# Patient Record
Sex: Male | Born: 1965 | Hispanic: Yes | Marital: Married | Smoking: Never smoker
Health system: Southern US, Community
[De-identification: ages and names within clinical notes are randomized; demographics above are authoritative.]

## PROBLEM LIST (undated history)

## (undated) DIAGNOSIS — Z992 Dependence on renal dialysis: Secondary | ICD-10-CM

## (undated) DIAGNOSIS — I10 Essential (primary) hypertension: Secondary | ICD-10-CM

## (undated) DIAGNOSIS — E119 Type 2 diabetes mellitus without complications: Secondary | ICD-10-CM

## (undated) DIAGNOSIS — N186 End stage renal disease: Secondary | ICD-10-CM

## (undated) DIAGNOSIS — N289 Disorder of kidney and ureter, unspecified: Secondary | ICD-10-CM

## (undated) HISTORY — PX: FOOT SURGERY: SHX648

## (undated) HISTORY — PX: AV FISTULA PLACEMENT: SHX1204

---

## 2007-04-02 ENCOUNTER — Encounter: Payer: Self-pay | Admitting: Family Medicine

## 2010-07-28 ENCOUNTER — Emergency Department: Payer: Self-pay | Admitting: Emergency Medicine

## 2010-08-17 ENCOUNTER — Emergency Department: Payer: Self-pay | Admitting: Emergency Medicine

## 2011-07-14 ENCOUNTER — Observation Stay: Payer: Self-pay | Admitting: Internal Medicine

## 2013-02-18 DIAGNOSIS — N179 Acute kidney failure, unspecified: Secondary | ICD-10-CM | POA: Insufficient documentation

## 2013-03-11 DIAGNOSIS — N19 Unspecified kidney failure: Secondary | ICD-10-CM | POA: Insufficient documentation

## 2013-03-16 ENCOUNTER — Inpatient Hospital Stay: Payer: Self-pay | Admitting: Internal Medicine

## 2013-03-16 LAB — HEMOGLOBIN A1C: Hemoglobin A1C: <3.5 % — ABNORMAL LOW (ref 4.2–6.3)

## 2013-03-16 LAB — COMPREHENSIVE METABOLIC PANEL
Albumin: 1.6 g/dL — ABNORMAL LOW (ref 3.4–5.0)
Alkaline Phosphatase: 151 U/L — ABNORMAL HIGH (ref 50–136)
Anion Gap: 4 — ABNORMAL LOW (ref 7–16)
Calcium, Total: 7.9 mg/dL — ABNORMAL LOW (ref 8.5–10.1)
Chloride: 105 mmol/L (ref 98–107)
Co2: 26 mmol/L (ref 21–32)
Creatinine: 6.54 mg/dL — ABNORMAL HIGH (ref 0.60–1.30)
EGFR (African American): 11 — ABNORMAL LOW
Glucose: 94 mg/dL (ref 65–99)
Osmolality: 276 (ref 275–301)
Potassium: 4.8 mmol/L (ref 3.5–5.1)
SGOT(AST): 24 U/L (ref 15–37)
Sodium: 135 mmol/L — ABNORMAL LOW (ref 136–145)
Total Protein: 7.1 g/dL (ref 6.4–8.2)

## 2013-03-16 LAB — CBC
MCH: 29.4 pg (ref 26.0–34.0)
MCHC: 33.8 g/dL (ref 32.0–36.0)
Platelet: 367 10*3/uL (ref 150–440)
RBC: 2.79 10*6/uL — ABNORMAL LOW (ref 4.40–5.90)
RDW: 14.7 % — ABNORMAL HIGH (ref 11.5–14.5)

## 2013-03-16 LAB — PHOSPHORUS: Phosphorus: 3.3 mg/dL (ref 2.5–4.9)

## 2013-03-17 LAB — MAGNESIUM: Magnesium: 1.4 mg/dL — ABNORMAL LOW

## 2013-03-17 LAB — HEMOGLOBIN A1C: Hemoglobin A1C: 4.9 % (ref 4.2–6.3)

## 2013-03-18 LAB — PHOSPHORUS: Phosphorus: 2.2 mg/dL — ABNORMAL LOW (ref 2.5–4.9)

## 2013-03-20 LAB — BASIC METABOLIC PANEL
Anion Gap: 3 — ABNORMAL LOW (ref 7–16)
BUN: 19 mg/dL — ABNORMAL HIGH (ref 7–18)
Calcium, Total: 7.4 mg/dL — ABNORMAL LOW (ref 8.5–10.1)
Co2: 31 mmol/L (ref 21–32)
Creatinine: 5.02 mg/dL — ABNORMAL HIGH (ref 0.60–1.30)
Glucose: 110 mg/dL — ABNORMAL HIGH (ref 65–99)
Osmolality: 271 (ref 275–301)
Potassium: 5 mmol/L (ref 3.5–5.1)
Sodium: 134 mmol/L — ABNORMAL LOW (ref 136–145)

## 2013-03-20 LAB — PLATELET COUNT: Platelet: 322 10*3/uL (ref 150–440)

## 2013-03-23 ENCOUNTER — Ambulatory Visit: Payer: Self-pay | Admitting: Nephrology

## 2013-04-07 ENCOUNTER — Ambulatory Visit: Payer: Self-pay | Admitting: Vascular Surgery

## 2013-04-07 LAB — BASIC METABOLIC PANEL
Anion Gap: 4 — ABNORMAL LOW (ref 7–16)
BUN: 23 mg/dL — ABNORMAL HIGH (ref 7–18)
Calcium, Total: 8.4 mg/dL — ABNORMAL LOW (ref 8.5–10.1)
Chloride: 101 mmol/L (ref 98–107)
EGFR (Non-African Amer.): 11 — ABNORMAL LOW
Osmolality: 274 (ref 275–301)
Sodium: 136 mmol/L (ref 136–145)

## 2013-04-07 LAB — CBC
Platelet: 335 10*3/uL (ref 150–440)
RBC: 3.17 10*6/uL — ABNORMAL LOW (ref 4.40–5.90)
WBC: 5.3 10*3/uL (ref 3.8–10.6)

## 2013-04-10 ENCOUNTER — Ambulatory Visit: Payer: Self-pay | Admitting: Vascular Surgery

## 2013-04-14 ENCOUNTER — Ambulatory Visit: Payer: Self-pay | Admitting: Vascular Surgery

## 2013-04-15 ENCOUNTER — Ambulatory Visit: Payer: Self-pay | Admitting: Vascular Surgery

## 2013-06-09 ENCOUNTER — Ambulatory Visit: Payer: Self-pay | Admitting: Vascular Surgery

## 2013-06-30 ENCOUNTER — Ambulatory Visit: Payer: Self-pay | Admitting: Vascular Surgery

## 2013-07-14 ENCOUNTER — Ambulatory Visit: Payer: Self-pay | Admitting: Physician Assistant

## 2013-07-21 DIAGNOSIS — L97409 Non-pressure chronic ulcer of unspecified heel and midfoot with unspecified severity: Secondary | ICD-10-CM | POA: Insufficient documentation

## 2013-12-30 ENCOUNTER — Inpatient Hospital Stay: Payer: Self-pay | Admitting: Family Medicine

## 2013-12-30 LAB — CBC
HCT: 34.1 % — AB (ref 40.0–52.0)
HGB: 11.4 g/dL — ABNORMAL LOW (ref 13.0–18.0)
MCH: 32.9 pg (ref 26.0–34.0)
MCHC: 33.4 g/dL (ref 32.0–36.0)
MCV: 99 fL (ref 80–100)
PLATELETS: 216 10*3/uL (ref 150–440)
RBC: 3.46 10*6/uL — AB (ref 4.40–5.90)
RDW: 14.2 % (ref 11.5–14.5)
WBC: 10.7 10*3/uL — ABNORMAL HIGH (ref 3.8–10.6)

## 2013-12-30 LAB — URINALYSIS, COMPLETE
Bilirubin,UR: NEGATIVE
Blood: NEGATIVE
Glucose,UR: >=500 mg/dL (ref 0–75)
Ketone: NEGATIVE
Leukocyte Esterase: NEGATIVE
NITRITE: NEGATIVE
Ph: 6 (ref 4.5–8.0)
RBC,UR: 11 /HPF (ref 0–5)
Specific Gravity: 1.018 (ref 1.003–1.030)
Squamous Epithelial: NONE SEEN
WBC UR: 2 /HPF (ref 0–5)

## 2013-12-30 LAB — COMPREHENSIVE METABOLIC PANEL
ALK PHOS: 152 U/L — AB
AST: 37 U/L (ref 15–37)
Albumin: 3.4 g/dL (ref 3.4–5.0)
Anion Gap: 11 (ref 7–16)
BUN: 59 mg/dL — ABNORMAL HIGH (ref 7–18)
Bilirubin,Total: 0.7 mg/dL (ref 0.2–1.0)
CALCIUM: 7.7 mg/dL — AB (ref 8.5–10.1)
CHLORIDE: 97 mmol/L — AB (ref 98–107)
CO2: 24 mmol/L (ref 21–32)
Creatinine: 9.41 mg/dL — ABNORMAL HIGH (ref 0.60–1.30)
GFR CALC AF AMER: 7 — AB
GFR CALC NON AF AMER: 6 — AB
Glucose: 117 mg/dL — ABNORMAL HIGH (ref 65–99)
OSMOLALITY: 282 (ref 275–301)
Potassium: 5.1 mmol/L (ref 3.5–5.1)
SGPT (ALT): 49 U/L (ref 12–78)
Sodium: 132 mmol/L — ABNORMAL LOW (ref 136–145)
Total Protein: 9.1 g/dL — ABNORMAL HIGH (ref 6.4–8.2)

## 2013-12-30 LAB — PHOSPHORUS: Phosphorus: 3.8 mg/dL (ref 2.5–4.9)

## 2013-12-30 LAB — RAPID INFLUENZA A&B ANTIGENS (ARMC ONLY)

## 2013-12-30 LAB — LIPASE, BLOOD: LIPASE: 134 U/L (ref 73–393)

## 2013-12-30 LAB — TROPONIN I: Troponin-I: 0.03 ng/mL

## 2013-12-31 LAB — COMPREHENSIVE METABOLIC PANEL
ALK PHOS: 120 U/L — AB
AST: 26 U/L (ref 15–37)
Albumin: 2.7 g/dL — ABNORMAL LOW (ref 3.4–5.0)
Anion Gap: 3 — ABNORMAL LOW (ref 7–16)
BUN: 33 mg/dL — AB (ref 7–18)
Bilirubin,Total: 0.8 mg/dL (ref 0.2–1.0)
Calcium, Total: 7.6 mg/dL — ABNORMAL LOW (ref 8.5–10.1)
Chloride: 99 mmol/L (ref 98–107)
Co2: 34 mmol/L — ABNORMAL HIGH (ref 21–32)
Creatinine: 6.57 mg/dL — ABNORMAL HIGH (ref 0.60–1.30)
EGFR (African American): 11 — ABNORMAL LOW
GFR CALC NON AF AMER: 9 — AB
Glucose: 106 mg/dL — ABNORMAL HIGH (ref 65–99)
Osmolality: 280 (ref 275–301)
Potassium: 4.2 mmol/L (ref 3.5–5.1)
SGPT (ALT): 36 U/L (ref 12–78)
Sodium: 136 mmol/L (ref 136–145)
Total Protein: 7.6 g/dL (ref 6.4–8.2)

## 2013-12-31 LAB — CBC WITH DIFFERENTIAL/PLATELET
BASOS PCT: 0.2 %
Basophil #: 0.1 10*3/uL (ref 0.0–0.1)
EOS PCT: 0.2 %
Eosinophil #: 0 10*3/uL (ref 0.0–0.7)
HCT: 30.4 % — ABNORMAL LOW (ref 40.0–52.0)
HGB: 10.2 g/dL — ABNORMAL LOW (ref 13.0–18.0)
LYMPHS ABS: 0.8 10*3/uL — AB (ref 1.0–3.6)
Lymphocyte %: 3.7 %
MCH: 33 pg (ref 26.0–34.0)
MCHC: 33.7 g/dL (ref 32.0–36.0)
MCV: 98 fL (ref 80–100)
Monocyte #: 1.2 x10 3/mm — ABNORMAL HIGH (ref 0.2–1.0)
Monocyte %: 5.4 %
NEUTROS PCT: 90.5 %
Neutrophil #: 20.5 10*3/uL — ABNORMAL HIGH (ref 1.4–6.5)
Platelet: 163 10*3/uL (ref 150–440)
RBC: 3.11 10*6/uL — AB (ref 4.40–5.90)
RDW: 14.2 % (ref 11.5–14.5)
WBC: 22.6 10*3/uL — ABNORMAL HIGH (ref 3.8–10.6)

## 2013-12-31 LAB — CLOSTRIDIUM DIFFICILE(ARMC)

## 2013-12-31 LAB — MAGNESIUM: MAGNESIUM: 1.9 mg/dL

## 2014-01-01 LAB — CBC WITH DIFFERENTIAL/PLATELET
BASOS ABS: 0.1 10*3/uL (ref 0.0–0.1)
BASOS PCT: 0.4 %
EOS PCT: 1.1 %
Eosinophil #: 0.1 10*3/uL (ref 0.0–0.7)
HCT: 28.4 % — AB (ref 40.0–52.0)
HGB: 9.5 g/dL — ABNORMAL LOW (ref 13.0–18.0)
LYMPHS ABS: 1.3 10*3/uL (ref 1.0–3.6)
LYMPHS PCT: 9.1 %
MCH: 32.8 pg (ref 26.0–34.0)
MCHC: 33.3 g/dL (ref 32.0–36.0)
MCV: 99 fL (ref 80–100)
MONOS PCT: 7.4 %
Monocyte #: 1 x10 3/mm (ref 0.2–1.0)
NEUTROS ABS: 11.6 10*3/uL — AB (ref 1.4–6.5)
Neutrophil %: 82 %
Platelet: 140 10*3/uL — ABNORMAL LOW (ref 150–440)
RBC: 2.89 10*6/uL — ABNORMAL LOW (ref 4.40–5.90)
RDW: 14.2 % (ref 11.5–14.5)
WBC: 14.1 10*3/uL — ABNORMAL HIGH (ref 3.8–10.6)

## 2014-01-01 LAB — PHOSPHORUS: Phosphorus: 5.5 mg/dL — ABNORMAL HIGH (ref 2.5–4.9)

## 2014-01-01 LAB — URINE CULTURE

## 2014-01-01 LAB — WBCS, STOOL

## 2014-01-04 LAB — VANCOMYCIN, TROUGH: Vancomycin, Trough: 22 ug/mL (ref 10–20)

## 2014-01-04 LAB — PHOSPHORUS: PHOSPHORUS: 4.4 mg/dL (ref 2.5–4.9)

## 2014-01-04 LAB — CULTURE, BLOOD (SINGLE)

## 2014-01-05 LAB — CULTURE, BLOOD (SINGLE)

## 2014-01-06 LAB — CULTURE, BLOOD (SINGLE)

## 2014-01-07 LAB — STOOL CULTURE

## 2014-04-14 ENCOUNTER — Emergency Department: Payer: Self-pay | Admitting: Emergency Medicine

## 2014-04-14 LAB — COMPREHENSIVE METABOLIC PANEL
ALT: 39 U/L (ref 12–78)
Albumin: 3.4 g/dL (ref 3.4–5.0)
Alkaline Phosphatase: 156 U/L — ABNORMAL HIGH
Anion Gap: 6 — ABNORMAL LOW (ref 7–16)
BUN: 16 mg/dL (ref 7–18)
Bilirubin,Total: 0.3 mg/dL (ref 0.2–1.0)
CHLORIDE: 92 mmol/L — AB (ref 98–107)
CO2: 33 mmol/L — AB (ref 21–32)
CREATININE: 4.85 mg/dL — AB (ref 0.60–1.30)
Calcium, Total: 8.4 mg/dL — ABNORMAL LOW (ref 8.5–10.1)
EGFR (African American): 15 — ABNORMAL LOW
EGFR (Non-African Amer.): 13 — ABNORMAL LOW
GLUCOSE: 171 mg/dL — AB (ref 65–99)
OSMOLALITY: 268 (ref 275–301)
POTASSIUM: 4.3 mmol/L (ref 3.5–5.1)
SGOT(AST): 34 U/L (ref 15–37)
SODIUM: 131 mmol/L — AB (ref 136–145)
Total Protein: 9.6 g/dL — ABNORMAL HIGH (ref 6.4–8.2)

## 2014-04-14 LAB — CBC WITH DIFFERENTIAL/PLATELET
BASOS ABS: 0.1 10*3/uL (ref 0.0–0.1)
Basophil %: 1.3 %
Eosinophil #: 0.3 10*3/uL (ref 0.0–0.7)
Eosinophil %: 7.5 %
HCT: 35 % — ABNORMAL LOW (ref 40.0–52.0)
HGB: 12 g/dL — ABNORMAL LOW (ref 13.0–18.0)
LYMPHS ABS: 1.1 10*3/uL (ref 1.0–3.6)
LYMPHS PCT: 25.3 %
MCH: 34.5 pg — AB (ref 26.0–34.0)
MCHC: 34.4 g/dL (ref 32.0–36.0)
MCV: 100 fL (ref 80–100)
Monocyte #: 0.4 x10 3/mm (ref 0.2–1.0)
Monocyte %: 9.6 %
NEUTROS ABS: 2.4 10*3/uL (ref 1.4–6.5)
Neutrophil %: 56.3 %
PLATELETS: 280 10*3/uL (ref 150–440)
RBC: 3.49 10*6/uL — AB (ref 4.40–5.90)
RDW: 13.9 % (ref 11.5–14.5)
WBC: 4.2 10*3/uL (ref 3.8–10.6)

## 2014-04-22 DIAGNOSIS — E113599 Type 2 diabetes mellitus with proliferative diabetic retinopathy without macular edema, unspecified eye: Secondary | ICD-10-CM | POA: Insufficient documentation

## 2014-04-22 DIAGNOSIS — H538 Other visual disturbances: Secondary | ICD-10-CM | POA: Insufficient documentation

## 2014-04-22 DIAGNOSIS — H04129 Dry eye syndrome of unspecified lacrimal gland: Secondary | ICD-10-CM | POA: Insufficient documentation

## 2014-07-22 ENCOUNTER — Ambulatory Visit: Payer: Self-pay | Admitting: Nephrology

## 2014-12-01 ENCOUNTER — Inpatient Hospital Stay: Payer: Self-pay | Admitting: Internal Medicine

## 2015-01-19 ENCOUNTER — Emergency Department: Admit: 2015-01-19 | Disposition: A | Discharge status: Home / Self Care | Payer: Self-pay | Admitting: Internal Medicine

## 2015-02-04 NOTE — Op Note (Signed)
PATIENT NAME:  Jerry [name], SISNEROS MR#:  [id number] DATE OF BIRTH:  01/01/1966  DATE OF PROCEDURE:  07/14/2013  PREOPERATIVE DIAGNOSIS: End-stage renal disease with functional permanent dialysis access and no longer needing PermCath.   POSTOPERATIVE DIAGNOSIS: End-stage renal disease with functional permanent dialysis access and no longer needing PermCath.   PROCEDURE PERFORMED: Removal of right jugular PermCath.   SURGEON: Personnel officer.   ANESTHESIA: Local.   ESTIMATED BLOOD LOSS: Minimal.   INDICATION FOR THE PROCEDURE: The patient is a 49 year old male with end-stage renal disease. His access is functional and he no longer needs his PermCath, therefore this will be removed.   DESCRIPTION OF THE PROCEDURE: The patient is brought to the vascular interventional radiology area and positioned supine. The right neck and chest and existing catheter were sterilely prepped and draped and a sterile surgical field was created. The area was locally anesthetized copiously with 1% lidocaine. Hemostats were used to help dissect out the cuff. An 11 blade was used to transect the fibrous sheath connected to the cuff. The catheter was then removed in its entirety without difficulty with gentle traction. Pressure was held at the base of the neck. Sterile dressing was placed. The patient tolerated the procedure well    ____________________________ Marin Shutter. Andreu Drudge, PA-C cnh:dp D: 07/14/2013 09:18:20 ET T: 07/14/2013 09:27:00 ET JOB#: [id number]  cc: Marin Shutter. Nabil Bubolz, PA-C, <Dictator> Gratz PA ELECTRONICALLY SIGNED 07/27/2013 15:35

## 2015-02-04 NOTE — Op Note (Signed)
PATIENT NAME:  Jerry [name], Jerry [name] MR#:  [id number] DATE OF BIRTH:  12-11-1965  DATE OF PROCEDURE:  06/30/2013  PREOPERATIVE DIAGNOSES:  1.  Complication arteriovenous dialysis fistula, left arm.  2.  Stricture of left radial artery.  POSTOPERATIVE DIAGNOSES:  1.  Complication arteriovenous dialysis fistula, left arm.  2.  Stricture of left radial artery.  PROCEDURES PERFORMED: 1.  Arch aortogram.  2.  Left upper extremity angiography, third order catheter placement.  3.  Percutaneous transluminal angioplasty to 5 mm, left radial artery, at the anastomosis of the AV fistula.   SURGEON: Hortencia Pilar, M.D.   SEDATION: Versed 5 mg plus fentanyl 150 mcg administered IV. Continuous ECG, pulse oximetry and cardiopulmonary monitoring is performed throughout the entire procedure by the interventional radiology nurse. Total sedation time was 1 hour 15 minutes.   ACCESS: 6-French sheath, right common femoral artery.   FLUOROSCOPY TIME: 5.2 minutes.   CONTRAST USED: Isovue 50 mL.   INDICATIONS: Jerry [name] is a 49 year old gentleman maintained on hemodialysis who has been having problems with his fistula. Work-up has demonstrated a stricture in the radial artery just above the anastomosis. He is therefore undergoing angiography with the intention for treating the stricture. The risks and benefits were reviewed. All questions answered. The patient has agreed to proceed.   DESCRIPTION OF PROCEDURE: The patient is taken to special procedures and placed in the supine position. After adequate sedation is achieved, both groins are prepped and draped in sterile fashion. Ultrasound is placed in a sterile sleeve. Femoral artery is identified. It is echolucent and pulsatile indicating patency. Image is recorded for the permanent record. Under direct visualization, a micropuncture needle is used to access the anterior wall of the common femoral artery, microwire followed micro sheath, J-wire  followed by 5-French sheath and 5-French pigtail catheter. Pigtail catheter is advanced over the J-wire into the ascending aorta and LAO projection of the arch is obtained. A 260 Advantage wire is then exchanged through the pigtail and H1 catheter is used to select the left subclavian. The wire-catheter combination is then serially advanced out doing hand injection of contrast to show the arterial anatomy of the left upper extremity. High bifurcation between the radial and ulnar branches is noted at the mid biceps level and the radial branch is selected as this is the branch that is associated with the fistula.   Selective imaging of the radial artery is then obtained which demonstrates the greater than 80% stricture at the level of the anastomosis. This appears to be a clamp injury and limiting flow to the fistula. Wire is then negotiated distal and distal radial artery angiography is performed via 150 straight glide catheter. Wire is then reintroduced and first a 4 x 4 balloon and then a 5 x 4 balloon is used to angioplasty the stenosis. Inflation is to 10 atmospheres for 1 minute and 3000 units of heparin had been given. Follow-up angiography demonstrates that there is now resolution of the stricture proximally. Distally there appears to be some spasm. However, given the flow in the radial artery is retrograde and that the hand is actually filled by the ulnar artery, this does not represent a problem and no further treatments are warranted as we have now established normal in-flow to the fistula.   The sheath is then pulled into the external iliac on the right, oblique view is obtained, and a StarClose device deployed without difficulty. There are no immediate complications.   INTERPRETATION: The aortic arch is  opacified with a bolus injection of contrast. There is a bovine anatomy noted. The arch is otherwise free of strictures or stenoses. The left subclavian is widely patent as is the axillary and the  proximal brachial. There is a high bifurcation of the radial and ulnar artery is noted at the mid biceps level. The fistula is filled by the radial branch and there is a greater than 80% stenosis just proximal to the fistula. The fistula is otherwise widely patent. Ulnar artery is widely patent as is the interosseous, which is based off the ulnar branch.   Following angioplasty, there is resolution of the stricture in the radial artery proximally.   SUMMARY: Successful treatment of radial artery stricture establishing normal flow to the fistula. ____________________________ Katha Cabal, MD ggs:sb D: 06/30/2013 11:06:54 ET T: 06/30/2013 11:27:26 ET JOB#: [id number]  cc: Katha Cabal, MD, <Dictator> Carla Debard Nephrologist Katha Cabal MD ELECTRONICALLY SIGNED 07/16/2013 8:39

## 2015-02-04 NOTE — Op Note (Signed)
PATIENT NAME:  [name], Jerry MR#:  [id number] DATE OF BIRTH:  Jul 01, 1966  DATE OF PROCEDURE:  06/09/2013  PREOPERATIVE DIAGNOSES: 1.  Complication of arteriovenous fistula.  2.  End-stage renal disease requiring hemodialysis.   POSTOPERATIVE DIAGNOSIS:  1.  Complication of arteriovenous fistula.  2.  End-stage renal disease requiring hemodialysis.   PROCEDURES PERFORMED: 1.  Contrast injection, left arm brachiocephalic fistula.   SURGEON: Katha Cabal, M.D.   SEDATION:  Versed 2 mg plus fentanyl 50 mcg administered IV. Continuous ECG, pulse oximetry and cardiopulmonary monitoring is performed throughout the entire procedure by the interventional radiology nurse. Total sedation time was 25 minutes.   ACCESS: 6-French sheath, left arm brachiocephalic fistula, antegrade direction.   CONTRAST USED: Isovue 20 mL.   FLUOROSCOPY TIME: 0.1 minutes.   INDICATIONS: [name] is a 49 year old gentleman who has end-stage renal disease. He is on hemodialysis via a right IJ tunnel catheter. Fistula is being evaluated as they have been unable to cannulate it.  Risks and benefits were reviewed. All questions answered. The patient agrees to proceed.   DESCRIPTION OF PROCEDURE: The patient is taken to special procedures and placed in the supine position. After adequate sedation is achieved, he is positioned supine with his left arm extended palm upward. The left arm is prepped and draped in a sterile fashion.   1% lidocaine is infiltrated into the soft tissues and access to the fistula is obtained with a micropuncture needle, microwire followed by microsheath, J-wire followed by a 6-French sheath. Hand injection of contrast is then utilized to demonstrate the fistula. After review of the images, pursestring suture is placed and light pressure is held, and there are no immediate complications.   INTERPRETATION: Images of the fistula demonstrate the cephalic vein is widely patent.  There are no lesions noted throughout its course nor is there any the lesions noted at the cephalic confluence. The central veins are all widely patent.   Reflux images, however, demonstrate that at the level of the anastomosis specifically, there is narrowing within the brachial artery, both proximal and distal to the anastomosis. Because of its bidirectional nature, this is not amenable to intervention from the direction of the fistula. This is a situation that would require intervention from a femoral approach. Therefore, the procedure was terminated at this time.   INTERPRETATION: As noted above, the fistula itself is widely patent with an excellent result. The central veins are widely patent. The anastomosis, however, demonstrates significant stenosis, both proximal and distal. Therefore, extensive discussion with the interpreter present was performed in the recovery area describing the need to approach this from a femoral approach where the brachial artery itself can be addressed treating the proximal and distal strictures simultaneously with a single balloon.     ____________________________ Katha Cabal, MD ggs:dmm D: 06/09/2013 18:25:25 ET T: 06/09/2013 19:36:23 ET JOB#: [id number]  cc: Katha Cabal, MD, <Dictator> Katha Cabal MD ELECTRONICALLY SIGNED 06/26/2013 9:11

## 2015-02-04 NOTE — H&P (Signed)
PATIENT NAME:  Jerry [name], WILKE MR#:  [id number] DATE OF BIRTH:  1966/07/11  DATE OF ADMISSION:  03/16/2013  REFERRING PHYSICIAN:  Dr. Donald Pore.   CHIEF COMPLAINT: Hemodialysis.   HISTORY OF PRESENT ILLNESS: A 49 year old male with a past history of diabetes, profound retinopathy and vision impairment, chronic kidney disease stage V, started on hemodialysis recently 2 weeks ago at [facility] with Permacath placement, hypertension, left foot ulcer status debridement. After starting him on dialysis and getting hemodialysis on last Friday he was discharged from [facility] as he is living near by [facility] and was told to go to [facility] Emergency Room at 7 in the morning on Monday and they will arrange for your hemodialysis.   The patient denies any complaints currently, and he is being admitted to get hemodialysis and arrange for outpatient hemodialysis.   SOCIAL HISTORY: He was a smoker in the past, quit 4 years ago. 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 Drug use: He reports that he does not have any illegal drug use. Lives at home with wife and children in [place].   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   FAMILY HISTORY: Mother has glaucoma, and no family history of chronic kidney disease.   REVIEW OF SYSTEMS: Negative for fever, fatigue, weakness, pain, or weight loss.  EYES: No blurring, double vision, pain or redness.  EARS, NOSE, THROAT: No tinnitus, ear pain or hearing loss.  RESPIRATORY: No cough, wheezing, hemoptysis or shortness of breath.  CARDIOVASCULAR: No chest pain, orthopnea, edema or  arrhythmia.  GASTROINTESTINAL: No nausea, vomiting, diarrhea or abdominal pain.   GENITOURINARY: No dysuria, hematuria or increased frequency.  ENDOCRINOLOGIC: No heat or cold intolerance.  SKIN: No acne or rashes.  MUSCULOSKELETAL: No swelling or pain in the joints.  NEUROLOGICAL: No numbness, weakness, tremors or headaches.  PSYCHIATRIC: No anxiety, insomnia.   HOME MEDICATIONS: Acetaminophen 325 mg, take 2 tablets every 4 hours as needed for pain, calcium carbonate 1000 mg oral tablet, 1 tablet 3 times a day, epoietin alpha 1 mL intravenous injection once a day with hemodialysis 3 times a week, gabapentin 600 mg oral tablet once a day as needed for pain, Kayexalate oral and rectal powder 60 mL once a day, currently not taking after discharge from [facility]; ondansetron 40 mg tablet every 8 hours as needed for nausea and vomiting, Renagel 800 mg oral tablet 3 times a day, senna 2 tablets once a day for constipation, trazodone 50 mg oral tablet once a day at bedtime as needed for sleep, vitamin D3 1000 international units oral tablet once a day.   VITALS: In the ER temperature 98.5, pulse rate 93, respirations 18, blood pressure 165/87, and pulse ox 100% on room air.  PHYSICAL EXAMINATION: He is fully alert, oriented to time, place and person.  HEENT: Head and neck atraumatic. Conjunctivae pink. Oral mucosa moist.  NECK: Supple. No JVD.  RESPIRATORY: Bilateral clear and equal air entry.  CARDIOVASCULAR: S1, S2 present, regular. No murmur. Right-sided Permacath present over the chest. Legally blind.  ABDOMEN: Soft, nontender. Bowel sounds present. No organomegaly.  SKIN: No rashes.  JOINTS: No swelling or tenderness.  LEGS: No edema.  NEUROLOGICAL: Power 5/5. Follows. No tremors.  PSYCHIATRIC: Does not appear in any acute psychiatric illness. Mood: Slightly upset as he feels bad that at [place] did not tell him proper information that he would not be able to get dialysis as an outpatient when  they inform him about all the details  LAB RESULTS:  BUN 31, creatinine is 6.54, sodium 135, potassium 4.8, chloride 105, CO2 26, calcium 7.9, total protein 7.1, bilirubin 0.2, alkaline phosphate 151, SGOT 24, SGPT 19.   WBC 6.9, hemoglobin 8.2, platelet count 367, MCV 87.   ASSESSMENT AND PLAN:  1. End-stage renal disease: Nephrology has evaluated the patient's Permacath. Will have to get inpatient dialysis. Case manager consult is called in to arrange for care of those to get him outpatient dialysis.  2.  Diabetes: Will do glucose checks, with coverage.  3.  Diabetic neuropathy: Will give him gabapentin as needed as he was taking at home.  4.  High blood pressure: We will give him amlodipine 10 mg oral daily  5.  Diabetic retinopathy: Legally blind. This is a chronic issue, and the patient is already followed by Ophthalmology at [facility]. Will leave it up to him.   Total time spent on this admission: Sixty minutes.   Translation was done by Ms Bonnita Nasuti with the patient in the Emergency Department, and condition and plan explained to the patient and his family members in the room.   CODE STATUS: FULL CODE.    ____________________________ Ceasar Lund Anselm Jungling, MD vgv:dm D: 03/16/2013 14:37:53 ET T: 03/16/2013 15:11:45 ET JOB#: [id number]  cc: Ceasar Lund. Anselm Jungling, MD, <Dictator> Vaughan Basta MD ELECTRONICALLY SIGNED 03/17/2013 20:10

## 2015-02-04 NOTE — Op Note (Signed)
PATIENT NAME:  Jerry [name], Jerry [name] MR#:  [id number] DATE OF BIRTH:  1966/07/15  DATE OF PROCEDURE:  04/15/2013  PREOPERATIVE DIAGNOSIS: End-stage renal disease requiring hemodialysis.   POSTOPERATIVE DIAGNOSIS: End-stage renal disease requiring hemodialysis.   PROCEDURE PERFORMED: Creation of a left arm brachiocephalic fistula.   SURGEON: Civil engineer, contracting.   ANESTHESIA: MAC.   ESTIMATED BLOOD LOSS: Minimal.   SPECIMEN: None.   INDICATIONS: [name] is a 49 year old gentleman who has end-stage renal disease and now is undergoing creation of a permanent dialysis access. The risks and benefits were reviewed. All questions answered. The patient agrees to proceed.   PROCEDURE: The patient was taken to the operating room and placed in the supine position. After adequate MAC anesthesia was induced, his left arm was prepped and draped in a sterile fashion. Quarter-percent Marcaine plain was then infiltrated into the soft tissues in the antecubital fossa and a curvilinear incision was created. The cephalic vein across the antecubital fossa was identified. It appeared to be a good vein for fistula creation.   The brachial artery was then exposed through the medial aspect of the incision and looped proximally and distally.   The cephalic vein was then skeletonized proximally and distally, ligating branches with 3-0 and 2-0 silk ties. It was marked with a surgical marker to maintain orientation. It was then transected distally with 2-0 silk ties. It was irrigated with heparinized saline and a bulldog clamp applied proximally.   An arteriotomy was then made with an 11 blade and extended with Potts scissors, and 6-0 Prolene stay sutures are placed, and an end-vein-to-side brachial artery anastomosis was fashioned with running 6-0 Prolene. Flushing maneuvers were performed and flow was established to the fistula. Excellent thrill was noted. The wound was then irrigated. Hemostasis was evaluated,  ensured, and then the wound was closed using interrupted 3-0 Vicryl, followed by 4-0 Monocryl subcuticular and Dermabond. The patient tolerated the procedure well. There were no immediate complications. Sponge and needle counts were correct, and he was taken to the recovery area in excellent condition.     ____________________________ Katha Cabal, MD ggs:dm D: 04/15/2013 09:04:34 ET T: 04/15/2013 09:33:28 ET JOB#: [id number]  cc: Katha Cabal, MD, <Dictator> Loma Messing, MD Katha Cabal MD ELECTRONICALLY SIGNED 04/21/2013 14:30

## 2015-02-04 NOTE — Discharge Summary (Signed)
PATIENT NAME:  Jerry [name], Jerry [name] MR#:  [id number] DATE OF BIRTH:  1966/06/23  DATE OF ADMISSION:  03/16/2013 DATE OF DISCHARGE:  03/20/2013  ADMITTING PHYSICIAN: Vaughan Basta, MD  DISCHARGING PHYSICIAN:  Gladstone Lighter, MD  PRIMARY CARE PHYSICIAN:  None.  [place]: Nephrology by Dr. Murlean Iba.   DISCHARGE DIAGNOSES: 1.  Pulmonary edema.  2.  End-stage renal disease on Monday, Wednesday and Friday hemodialysis.  3.  Hypertension.  4.  Diabetes, diet-controlled.  5.  Diabetic retinopathy and legally blind.  6.  Diabetic neuropathy.  7.  Anemia of chronic disease. 8.  Secondary hyperparathyroidism.  DISCHARGE HOME MEDICATIONS:  1.  Tylenol 650 mg q. 4 hours p.r.n. for pain or fever.  2.  MiraLax 17 gram powder as needed for constipation daily.  3.  Senokot 2 tablets once a day at bedtime.  4.  Tramadol 50 mg take up to 2 tablets once a day at bedtime.  5.  Losartan 100 mg p.o. daily at bedtime.  6.  Gabapentin 300 mg p.o. q. 8 hours.  7.  Amlodipine 10 mg p.o. daily.  8.  Vitamin D3 1000 international units p.o. daily.  9.  Renagel 800 mg tablets p.o. 3 times a day.  10.  Lasix 80 mg p.o. daily.   DISCHARGE HOME OXYGEN:  None.  DISCHARGE DIET: Renal.  DISCHARGE ACTIVITY: As tolerated.   FOLLOW-UP INSTRUCTIONS:  Appointment is set up for dialysis with [facility] on [address] for 03/23/2013.   LABORATORY AND DIAGNOSTICS PRIOR TO DISCHARGE: Chest x-ray prior to discharge showing congestive heart failure, mild edema, small amounts of pleural fluid in the major fissure and along lateral pleura versus bilaterally seen.   Sodium 134, potassium 5.0, chloride 100, bicarb 31, BUN 19, creatinine 5.02, glucose 110 and calcium 7.4. HbA1c is 4.9. TSH within normal limits at 1.42.  Hepatitis C surface antigen is negative.  WBC 6.9, hemoglobin 8.2, hematocrit 24.3, platelet count 367. Sodium 135, potassium 4.8, chloride 105, bicarb 26, BUN 31, creatinine  6.54, glucose 94 and calcium of 7.9.   ALT 19, AST 24, alk phos 151, total bilirubin 0.2 and albumin of 1.6.   BRIEF HOSPITAL COURSE:  1.  Jerry [name] is a 49 year old Spanish-speaking Hispanic male with past medical history significant for type 1 diabetes mellitus and also its complications including retinopathy for which he is legally blind, diabetic neuropathy and nephropathy resulting in end-stage renal disease, who was started on hemodialysis last month at [facility] and was discharged from [facility] without any hemodialysis followup so was admitted to our hospital. He did have mild pulmonary edema on admission for which he was dialyzed on consecutive days and care management was working with the hospital administration to set up outpatient dialysis. The patient does not have any insurance. His Medicaid has not been applied yet and hospital made a contract with [facility] and finally he got an outpatient dialysis spot at [facility] on [address] for 03/23/2013, which is a Monday, so he was last dialyzed on Friday prior to discharge and was discharged home in a stable condition.  2.  Hypertension. Blood pressure medications were adjusted and he is on Norvasc and losartan and Lasix for his blood pressure and well-controlled.  3.  Diabetic neuropathy. On gabapentin.   His course has been otherwise uneventful in the hospital.   DISCHARGE CONDITION: Stable.   DISCHARGE DISPOSITION: Home.   TIME SPENT ON DISCHARGE: 40 minutes.  ____________________________ Gladstone Lighter, MD rk:sb D: 03/23/2013 15:45:05 ET T:  03/23/2013 16:16:14 ET JOB#: [id number]  cc: Gladstone Lighter, MD, <Dictator> [place]so[place] Lilian Kapur, MD Gladstone Lighter MD ELECTRONICALLY SIGNED 04/06/2013 13:46

## 2015-02-04 NOTE — Op Note (Signed)
PATIENT NAME:  Jerry [name], Jerry [name] MR#:  [id number] DATE OF BIRTH:  [birth year]  DATE OF PROCEDURE:  04/10/2013  PREOPERATIVE DIAGNOSES: 1.  Complication of cuffed tunneled dialysis catheter.  2.  End-stage renal disease, requiring hemodialysis.   POSTOPERATIVE DIAGNOSES:  1.  Complication of cuffed tunneled dialysis catheter.  2.  End-stage renal disease, requiring hemodialysis.   PROCEDURE PERFORMED: TPA clearance of right IJ tunneled dialysis catheter.   SURGEON:  Katha Cabal, MD   DESCRIPTION OF PROCEDURE: The patient is brought to the special procedures holding area, and 2 mg of t-PA are reconstituted in 50 mL, 2 aliquots are performed. Continuous infusion of t-PA is then performed through both lumens, after 2-1/2 hours, I personally aspirated and then flushed both lumens. Both lumens aspirated easily, flushed well, with good result. Therefore, 5000 units of heparin per lumen was packed, and a sterile dressing was applied. The patient tolerated the procedure well. Successful salvage of right IJ tunneled catheter.    ____________________________ Katha Cabal, MD ggs:mr D: 04/11/2013 10:42:56 ET T: 04/11/2013 20:37:33 ET JOB#: [id number]  cc: Katha Cabal, MD, <Dictator> Katha Cabal MD ELECTRONICALLY SIGNED 04/21/2013 14:29

## 2015-02-04 NOTE — Op Note (Signed)
PATIENT NAME:  Jerry [name], Jerry [name] MR#:  [id number] DATE OF BIRTH:  May 24, 1966  DATE OF PROCEDURE:  04/14/2013  PREOPERATIVE DIAGNOSES:  1.  Complication arteriovenous dialysis with non-function of right IJ cuffed tunneled dialysis catheter.  2.  End-stage renal disease, requiring hemodialysis.   POSTOPERATIVE DIAGNOSES: 1.  Complication arteriovenous dialysis with non-function of right IJ cuffed tunneled dialysis catheter.  2.  End-stage renal disease, requiring hemodialysis.   PROCEDURE PERFORMED: Exchange dialysis catheter, same site.   SURGEON: Dr. Delana Meyer.  SEDATION: Versed 3 mg plus fentanyl 100 mcg administered IV. Continuous ECG, pulse oximetry and cardiopulmonary monitoring is performed throughout the entire procedure by the interventional radiology nurse. Total sedation time was 40 minutes.   ACCESS: Right IJ.   CONTRAST USED: None.   FLUOROSCOPY TIME: 0.6 minutes.   INDICATIONS: [name] is a 49 year old gentleman, who presents for exchange of a nonfunctioning catheter. Risks and benefits were reviewed. All questions answered. The patient has agreed to proceed.   DESCRIPTION OF PROCEDURE: The patient is taken to the special procedure suite, placed in the supine position. After adequate sedation is achieved, the right neck and chest wall as well as the existing catheter are prepped and draped in a sterile fashion. A small incision is created overlying the cuff and the dissection is carried down to expose the catheter, which is grasped. The cuff is then freed from the surrounding tissues. It is transected with scissors in a sterile location. Two stiff angled Glidewires were reintroduced under fluoroscopic guidance and advanced through the atrium into the inferior vena cava. The old hub is then removed and passed off the field to be thrown away. The existing catheter is then withdrawn with pressure at the base of the neck, and a 23 cm tip to cuff Cannon catheter is  advanced over the wires and positioned with its cuff just above the clavicle. It is then trimmed to an appropriate length, hub is connected and both lumens aspirate and flush easily. There is a small amount of clot noted on the wires upon removal and therefore Cathflo instead of heparin is used to pack 1 mg per lumen. The catheter is secured to the chest wall with 0 silk and 4-0 Monocryl is used to the narrow the exit site slightly for stability of the catheter. Sterile dressing is applied. Ointment is applied to the old exit site. The patient tolerated the procedure well and there were no immediate complications.  ____________________________ Katha Cabal, MD ggs:aw D: 04/14/2013 09:06:49 ET T: 04/14/2013 09:17:47 ET JOB#: [id number]  cc: Katha Cabal, MD, <Dictator> Katha Cabal MD ELECTRONICALLY SIGNED 04/21/2013 14:30

## 2015-02-05 NOTE — Discharge Summary (Signed)
PATIENT NAME:  Jerry [name], Jerry [name] MR#:  [id number] DATE OF BIRTH:  02-Nov-1965  DATE OF ADMISSION:  12/30/2013 DATE OF DISCHARGE:  01/04/2014  FINAL DIAGNOSES: 1.  Sepsis with bacteroides and gram-positive cocci.  2.  Diarrhea, abdominal pain, nausea, vomiting.  3.  End-stage renal disease on hemodialysis Monday, Wednesday and Friday.  4.  Hypertension.  5.  Diabetes.  6.  Bilateral foot ulcers.  7.  Insomnia.   MEDICATIONS ON DISCHARGE: Include amlodipine 10 mg daily, vitamin D 1000 International Units daily, Lasix 80 mg daily, Renvela 800 mg 3 tablets 3 times a day with meals, losartan 100 mg daily, Neurontin 300 mg 2 capsules once a day in the evening, trazodone 50 mg at bedtime, Augmentin 500 mg orally daily for 10 days. Stop taking tramadol.   DIET: Low sodium diet, carbohydrate diet, regular consistency.   ACTIVITY: As tolerated.   FOLLOW UP WITH: Dialysis Monday, Wednesday, Friday; your podiatrist on followup on foot ulcers 1 to 2 weeks, Philis Pique.   HOSPITAL COURSE: The patient was admitted 12/30/2013, with nausea, vomiting, hypoxia, abdominal discomfort, fever of 103, was admitted with systemic inflammatory response syndrome and infectious disease consultation was obtained by Dr. Ola Spurr. The patient was initially on vancomycin and Zosyn. Laboratory and radiological data during the hospital course included an EKG that showed sinus tachycardia, nonspecific ST wave changes. Hepatitis B surface antigen negative. Blood culture grew out gram-positive cocci in chains in anaerobic bottle only. Troponin was negative. Glucose 117, BUN 59, creatinine 9.41, sodium 132, potassium 5.1, chloride 97, CO2 of 24, calcium 7.7. Liver function tests: Alkaline phosphatase 152, ALT 49, AST 37. White blood cell count 10.7, H and H 11.4 and 34.1, platelet count of 216. Lipase 134. Influenza A and B negative. Another blood culture grew out bacteroides in anaerobic bottle only. Chest x-ray showed  stable pulmonary vascular congestion. Urine culture: No growth. Foot x-ray: No osteomyelitis. Parathyroid hormone intact 231. CT scan of the abdomen and pelvis: No acute findings. White blood cells in the stool: None. Stool for C. diff is negative. Repeat blood cultures negative.   HOSPITAL COURSE PER PROBLEM LIST:  1. For the patient's sepsis with bacteroides and gram-positive cocci, the patient was on vancomycin and Zosyn during the entire hospital course. The patient's repeat blood cultures were negative. The patient was seen in consultation by Dr. Ola Spurr who recommended switching over to Augmentin for a total 14 day course, starting from the 20th. The patient was given a dose of Augmentin after the dialysis on the 23rd and another 10 days to go. Source is likely GI in nature.  2.  Diarrhea, abdominal pain, nausea and vomiting; this had resolved. The patient did have a little diarrhea, but stool study negative.  3. End-stage renal disease on hemodialysis. Hemodialysis will be continued Monday, Wednesday, Friday.  4.  Hypertension. Blood pressure stable on amlodipine, losartan.  5.  Diabetes, diet controlled.  6.  Bilateral foot ulcers; needs to follow up with podiatry as outpatient.  7.  Insomnia, better with trazodone.   TIME SPENT ON DISCHARGE: 35 minutes.  ____________________________ Tana Conch. Leslye Peer, MD rjw:dmm D: 01/04/2014 16:15:33 ET T: 01/04/2014 20:14:23 ET JOB#: [id number]  cc: Tana Conch. Leslye Peer, MD, <Dictator> [place] MD ELECTRONICALLY SIGNED 01/08/2014 16:28

## 2015-02-05 NOTE — Consult Note (Signed)
PATIENT NAME:  Jerry [name], Jerry [name] MR#:  [id number] DATE OF BIRTH:  1965/12/25  DATE OF CONSULTATION:  12/31/2013  INFECTIOUS DISEASE CONSULTATION  REFERRING PHYSICIAN:  Dr. Darvin Neighbours CONSULTING PHYSICIAN:  Cheral Marker. Ola Spurr, MD  REASON FOR CONSULTATION: Fever and leukocytosis.   HISTORY OF PRESENT ILLNESS: A 49 year old pleasant gentleman history of end-stage renal disease, blindness and hypertension who was admitted yesterday from dialysis with episodes of vomiting, as well as fever. He reports that his symptoms began 3 days ago when he started to have fevers and aches. He basically hurt all over. He cannot localize one area where he feels bad. In dialysis he vomited and was admitted to hospital. The patient started on broad-spectrum antibiotics and blood cultures are pending. He has also developed diarrhea since admission.   PAST MEDICAL HISTORY: 1.  Diabetes.  2.  Blindness.  3.  Glaucoma.  4.  End-stage renal disease.  5.  Hypertension.  6.  Bilateral chronic foot ulcers for 2 years. He is followed at [facility] for this.  7.  Peripheral neuropathy.   PAST SURGICAL HISTORY: Foot ulcer debridement and fistula placement left upper extremity.   FAMILY HISTORY: Glaucoma in his mother.   SOCIAL HISTORY: He smokes 3 to 4 packs a day. He used to drink heavily, but quit 5 years ago. He lives with his son and his wife. He is on disability.   ALLERGIES: No known drug allergies.   MEDICATIONS: Current antibiotics include vancomycin and Zosyn.  REVIEW OF SYSTEMS: 11 systems reviewed and negative except as per history of present illness.   PHYSICAL EXAMINATION: VITAL SIGNS: Temperature 102 this morning. Temperature current 99.7, pulse 92, blood pressure 107/63, pulse ox 94%, respirations 18.  GENERAL: He is pleasant, interactive, sitting in bed. He is blind. His oropharynx is clear.  NECK: Supple. There is no anterior cervical, posterior cervical or supraclavicular lymphadenopathy.  HEART:  Regular with 1/6 systolic murmur.  LUNGS: Have mild bibasilar crackles but no wheeze.  ABDOMEN: Soft. Mild diffuse tender to palpation but nonfocal. No rebound or guarding.  EXTREMITIES: He has mild edema in his right lower extremity. He has plantar ulcers on his bilateral feet. These are eschar but do not appear infected. There is no tenderness or drainage or spreading redness. NEUROLOGIC:  He is alert, interactive, able to move all 4 extremities.  DATA:  Foot x-ray March 18, no evidence of osteomyelitis on the left also on the right. Chest x-ray March 18 showed stable central pulmonary vascular congestion. There are 2 ingested foreign bodies in the distal esophagus that may represent pills or tablets. CT of his abdomen done with IV and oral contrast shows atelectasis in the lung bases, perinephric stranding around the kidneys bilaterally, but no hydronephrosis or stones; otherwise, normal.  White blood cultures x 2 are pending. Urinalysis on admission showed only 2 white cells. Urine culture is pending. Flu swab is negative. White blood count today is 22.6, up from 10.7 yesterday. Hemoglobin is 10.2, platelets 163. Basic panel is consistent with end-stage liver disease. LFTs are normal, except an albumin down at 2.7. Lipase is 134.   IMPRESSION:  A 49 year old gentleman end-stage renal disease on dialysis through a left upper extremity fistula, admitted with nausea, vomiting and a 3-day illness characterized by fever and myalgias. Flu testing is negative. Other tests are negative, including CT of his abdomen. He is currently on Vancomycin and Zosyn.  It is unclear if he could have blood stream infection through his dialysis site or  just a viral gastroenteritis. Another option would be Clostridium difficile and testing is pending.   RECOMMENDATIONS: 1.  At this point recommend continue Vancomycin and Zosyn.  2.  Check C. difficile.  3.  Follow blood cultures and fever curve.  4.  Further work-up  depending on results of above.   Thank you for the consult. I will be glad to follow with you.   ____________________________ Cheral Marker. Ola Spurr, MD dpf:ce D: 12/31/2013 14:34:24 ET T: 12/31/2013 16:32:45 ET JOB#: [id number]  cc: Cheral Marker. Ola Spurr, MD, <Dictator> Madeeha Costantino Ola Spurr MD ELECTRONICALLY SIGNED 01/16/2014 13:26

## 2015-02-05 NOTE — H&P (Signed)
PATIENT NAME:  Jerry [name], Jerry [name] MR#:  [id number] DATE OF BIRTH:  1966-04-24  DATE OF ADMISSION:  12/30/2013  REFERRING PHYSICIAN: Desiree Lucy. Jasmine December, MD  REASON FOR ADMISSION: Nausea, vomiting, hypoxia, abdominal discomfort, fever of 103.   HISTORY OF PRESENT ILLNESS: This is a very nice 49 year old gentleman with primary care physician at [facility]. The patient has a history of diabetes, hypertension. He is legally blind. He has been started on hemodialysis 9 months ago. Comes today with a history of being sent from dialysis because he had several episodes of vomiting. Apparently the patient was okay whenever he went to bed, but the day before he was having some fevers, chills, and body aches all over. Other than that, he denies any shortness of breath. Denies any chest pain. Denies any sick contact. He states that he urinates 6 times at night and maybe a couple of times a day, but he has not had any dysuria. The patient was sent for x-ray, and there are a couple of pills stuck on his hiatal hernia, but overall now he seems like he is able to eat and drink. The patient is admitted mostly for fever of unknown origin, systemic inflammatory response syndrome.  REVIEW OF SYSTEMS: A 12-system review of systems is done. The patient is a poor historian. He is a Paediatric nurse. I am also a Spanish speaker primarily, but he seems to have problems understanding his medical history.  CONSTITUTIONAL: Positive fever. Positive fatigue. Negative weakness or weight loss.  EYES: No blurry vision or double vision. The patient is actually blind. No inflammation. The patient has a history of glaucoma in the past.  EARS, NOSE, THROAT: No difficulty swallowing. No dysphagia. No tinnitus. No ear pain.  RESPIRATORY: No cough, wheezing, or hemoptysis. The patient states that he is just not having any significant respiratory problems at all.  CARDIOVASCULAR: No chest pain, orthopnea, or syncope.  GASTROINTESTINAL: No nausea,  vomiting prior to today. Today, he had several episodes of vomiting prior to dialysis. Negative GERD. Positive hiatal hernia. Negative hemorrhoids.  GENITOURINARY: No dysuria, hematuria, or changes in frequency. Again, he still makes good amounts of urine.  ENDOCRINE: No polyuria, polydipsia, polyphagia, cold or heat intolerance.  HEMATOLOGIC AND LYMPHATIC: No anemia, easy bruising or bleeding.  MUSCULOSKELETAL: No neck pain, back pain.  SKIN: No rashes or petechiae. The patient has 2 ulcers on the ball of the foot that are healing, not infected.  NEUROLOGIC: No numbness, tingling, CVA or TIA. Positive neuropathy peripherally.  PSYCHIATRIC: No anxiety or depression.   PAST MEDICAL HISTORY: 1.  Diabetes.  2.  Blindness.  3.  Glaucoma.  4.  End-stage renal disease.  5.  Hypertension.  6.  Bilateral foot ulcers.  7.  Neuropathy.   ALLERGIES: No known drug allergies.   FAMILY HISTORY: Positive for glaucoma in his mom. Other than that, no MI, no cancer, no strokes.   PAST SURGICAL HISTORY: The patient had a foot ulcer debridement and a fistula placed in the left upper extremity.   SOCIAL HISTORY: The patient used to smoke 3 to 4 cigarettes a day, but he quit 5 years ago. He used to drink heavily; he quit about 5 years ago. He lives with his son, his wife. He does not work. He is on disability due to end-stage renal disease.   MEDICATIONS: Vitamin D3 at 1000 units once a day, timolol 50 mg every 6 hours, Renvela 800 mg takes 2 tablets 3 times a day, Neurontin 300 mg  2 capsules once a day, losartan 100 mg once a day, Lasix 80 mg once a day, amlodipine 10 mg once a day.   PHYSICAL EXAMINATION:  VITAL SIGNS: Blood pressure 128/64, pulse 93, respirations 17, temperature of 103. Heart rate up to 114, respiratory rate up to 22.  GENERAL: The patient is alert, oriented x 3, no acute distress, no respiratory distress. Hemodynamically stable.  HEENT: Pupils are equal and reactive. Extraocular  movements are intact. Mucosae are moist. Anicteric sclerae. Pink conjunctivae. No oral lesions. No oropharyngeal exudates.  NECK: Supple. No JVD. No thyromegaly. No adenopathy. No carotid bruits. No rigidity.  CARDIOVASCULAR: Regular rate and rhythm. No murmurs, rubs or gallops are appreciated. No displacement of PMI.  LUNGS: Clear without any wheezing or crepitus. No use of accessory muscles.  ABDOMEN: Soft, nontender, nondistended. No hepatosplenomegaly. No masses. Bowel sounds are positive. Whenever press down into the right upper quadrant, the patient seems a little bit uncomfortable. He feels a little bit distended. He is a little bit of a poor historian and he has neuropathy, so I am not quite sure of the significance of these symptoms, although there is some distention on the upper abdomen. No rebound tenderness.  GENITAL: Negative for external lesions.  EXTREMITIES: No cyanosis or clubbing. Positive trace edema. Positive ulcers at the level of the ball of the feet without any significant bad odor, no significant signs of infection, no fluctuance.  SKIN: No rashes or petechiae.  MUSCULOSKELETAL: No joint effusions or joint swelling.  LYMPHATIC: Negative for lymphadenopathy in neck or supraclavicular areas.  NEUROLOGIC: Cranial nerves II through XII intact. Strength is 5 out of 5 in 4 extremities. Sensation is decreased in lower extremities, chest, and upper extremities due to neuropathy.   LABORATORY AND RADIOLOGICAL DATA: Glucose 117, BUN 59; sodium is 132, creatinine 9.41; calcium is 7.7, total protein 9.1. LFTs otherwise within normal limits except for a slight elevation of alkaline phosphatase at 152. White blood count is 10.7, hemoglobin 11, platelet count 216. Chest x-ray showed no acute exudates or infiltrates. There are 2 pills that are well visualized on a hiatal hernia, but they do not seem to be creating any significant dilation of the esophagus. There is some fluid on the lung fissure.  There is slight flow cephalization, and there is cardiomegaly. It seems like there is stool in the colon, but no air infradiaphragmatic.   ASSESSMENT AND PLAN: This is a nice 49 year old gentleman with end-stage renal disease, hypertension, diabetes, legally blind, on dialysis, who comes with nausea, vomiting as the main complaint. Found out to be tachycardic, tachypneic, with a fever of 103 and hypoxia of 88% on room air.  1.  Nausea, vomiting: Unknown the etiology of this. We can see 2 pills on his hiatal hernia, but no signs of dilation of the esophagus, unknown the significance of this. The patient has some abdominal discomfort and distention, for what we are going to do a CT scan of the abdomen to evaluate this.  2.  Systemic inflammatory response syndrome: The patient could have multiple etiologies. At this moment, nothing is really showing up. The patient still makes urine, for what we are going to do a urine culture. The patient has a chest x-ray that did not show any significant signs of infection. No significant elevation in LFTs to think cholecystitis. We are going to do a CT scan to rule out intra-abdominal process. Since the patient has a fistula, consider the possibility of bacteremia and infections of  the fistula, although the fistula looks very good. No erythema. No swelling. Consider doing an ultrasound Doppler if necessary but again, the fistula is working okay and clinically it does not look infected. Other problems could be endocarditis. If no other source, do an echocardiogram; a TEE will be helpful. We are going to get blood cultures, urine culture, and evaluate that way first. The patient is started on antibiotics, Zosyn and vancomycin for broad spectrum coverage.  3.  He also had some ulcers on the feet, for what we are going to do x-rays to evaluate for possible osteomyelitis. Consider ID consult if necessary.  4.  As far as his diabetes, the patient is well controlled, apparently not  taking any medications for it. We are going to do insulin sliding scale and monitor closely.  5.  As far as his hypertension, the patient seems to be stable. Continue losartan and continue Lasix and amlodipine. 6.  As far as his end-stage renal disease, nephrology consultation for dialysis today.  7.  Anemia of chronic kidney disease, stable, with hemoglobin of 11.  8.  Legally blind. Monitor for falls.  9.  Other medical problems seem to be stable.  10.  The patient is a full code.   TIME SPENT: I spent about 50 minutes with this patient.    ____________________________ Rivanna Sink, MD rsg:jcm D: 12/30/2013 16:52:01 ET T: 12/30/2013 17:32:00 ET JOB#: [id number]  cc: [place] Sink, MD, <Dictator>  Espen Bethel America Brown MD ELECTRONICALLY SIGNED 01/10/2014 13:44

## 2015-02-07 LAB — SURGICAL PATHOLOGY

## 2015-02-13 NOTE — Consult Note (Signed)
PATIENT NAME:  Jerry [name], Jerry [name] MR#:  [id number] DATE OF BIRTH:  04-10-1966  DATE OF CONSULTATION:  12/03/2014  REFERRING PHYSICIAN:  Srikar R. Sudini, MD  CONSULTING PHYSICIAN:  Cheral Marker. Ola Spurr, MD  INFECTIOUS DISEASE CONSULTATION   REASON FOR CONSULTATION: Bacteremia and infected foot ulcer.   HISTORY OF PRESENT ILLNESS: A 49 year old unfortunate gentleman with diabetes and end-stage renal disease, who was admitted February 17 with fevers, vomiting, and nausea. He has had a chronic draining ulcer in his lower extremity and has been seen by podiatry and is going to have surgery and debridement of it today. He has also been found to have positive blood cultures from his outpatient dialysis center; further ID of these are pending.   PAST MEDICAL HISTORY: Diabetes, blindness, glaucoma, end-stage renal disease, hypertension, bilateral foot ulcers, diabetic neuropathy.   PAST SURGICAL HISTORY: Foot debridement and fistula placement in the left upper extremity.   ALLERGIES: No known drug history.   FAMILY HISTORY: Noncontributory.   SOCIAL HISTORY: Quit smoking many years ago. He used to drink heavily, but no longer. Lives with his wife. He is on disability. Does not speak [place].   REVIEW OF SYSTEMS: Eleven systems reviewed and negative except as per HPI.   ANTIBIOTICS SINCE ADMISSION: Include vancomycin and Zosyn.   PHYSICAL EXAMINATION:  VITAL SIGNS: Temperature 98.2, pulse 70, blood pressure 179/90, respirations 17, saturation 95% on room air.  GENERAL: He is blind. He is sitting in bed. He is in no acute distress.  HEENT: Sclerae are anicteric. Oropharynx clear.  NECK: Supple.  HEART: Regular.  LUNGS: Clear.  ABDOMEN: Soft, nontender, nondistended.  VASCULAR: He has a left AV fistula site, which appears in normal limits with no warmth or redness.  LOWER EXTREMITIES: Reveal ulcers on the plantar surface of his foot. There is some serosanguineous drainage from it. He has  no sensation in his feet.   DATA: White blood count on admission, February 17, was 10.4, hemoglobin 9.1, platelets 155,000. Renal function is consistent with end-stage renal disease. Blood cultures are pending, but are reportedly positive for gram-positive cocci in clusters as well as gram-negative rods in blood cultures done at dialysis. Blood cultures from admission, February 17, are no growth to date. Urine culture, February 18, negative.   IMAGING: Right foot x-ray, February 17, showed plantar skin ulcer. Negative for plain film evidence of osteomyelitis.   IMPRESSION: A 50 year old gentleman with end-stage renal disease, severe diabetes, admitted with nausea, vomiting, fever, and sepsis. He was found to have osteomyelitis of his right foot and today he is undergoing debridement of the fifth metatarsal joint and soft tissue debridement. He also has bacteremia with gram-positive cocci in clusters and gram-negative rods, yet to be identified. Clinically, he is hemodynamically stable and does not have a white count.   RECOMMENDATIONS:  1.  Continue vancomycin and Zosyn.  2.  Further antibiotic recommendations based on cultures from the wound as well as final results from [facility] blood cultures.   Thank you for the consult. I will be glad to follow with you.    ____________________________ Cheral Marker. Ola Spurr, MD dpf:bm D: 12/03/2014 23:04:21 ET T: 12/03/2014 23:42:13 ET JOB#: [id number]  cc: Cheral Marker. Ola Spurr, MD, <Dictator> Yaret Hush Ola Spurr MD ELECTRONICALLY SIGNED 12/16/2014 19:51

## 2015-02-13 NOTE — Consult Note (Signed)
PATIENT NAME:  Jerry [name], Jerry [name] MR#:  [id number] DATE OF BIRTH:  29-Jan-1966  DATE OF CONSULTATION:  12/02/2014  REFERRING PHYSICIAN:   CONSULTING PHYSICIAN:  Sharlotte Alamo, DPM  HISTORY OF PRESENT ILLNESS: This is a 49 year old Hispanic male who was admitted to the hospital recently from dialysis for nausea and vomiting with fever. The patient does relate a history of ulcerations on both of his feet, also relates a history of foot surgery approximately 3 years ago. Recently he has been seen at the wound care center, which I think is down in [facility]. He denies any injury to the feet.   PAST MEDICAL HISTORY: 1.  Diabetes with associated neuropathy.  2.  Blindness.  3.  Glaucoma.  4.  End-stage renal disease.  5.  Hypertension.   PAST SURGICAL HISTORY:  1.  Bilateral feet procedure, unsure. 2.  Fistula placement, left upper extremity.   HOME MEDICATIONS: 1.  Vitamin D3 1,000 international units daily.  2.  Renvela 800 mg 2 tablets 3 times a day.  3.  Neurontin 600 mg once daily.  4.  Losartan 100 mg daily.  5.  Trazodone 50 mg daily.   ALLERGIES: No known drug allergies.   FAMILY HISTORY: Mother had glaucoma.   SOCIAL HISTORY: History of tobacco use, but stopped 6 years ago. Also history of heavy alcohol abuse, but quit at the same time. He is married. He is not working and on disability.   REVIEW OF SYSTEMS: CONSTITUTIONAL: Some generalized fatigue.  EYES: He is blind. No pain.  ENT: Denies hearing loss or pain or ringing in the ears.  RESPIRATORY: No shortness of breath. He does have a cough with some sputum.  CARDIOVASCULAR: No chest pain.  GASTROINTESTINAL: Recent nausea and vomiting, none current. GENITOURINARY: No dysuria or incontinence. ENDOCRINE: No polyuria, nocturia or thyroid problems. HEMATOLOGY: No easy bruising or bleeding.  INTEGUMENTARY: Chronic sores beneath both of his feet. Some drainage from the right.  MUSCULOSKELETAL: Some chronic back pain.   NEUROLOGICAL: Some numbness and paresthesias in the feet.   PHYSICAL EXAMINATION: VASCULAR: DP and PT pulses are palpable bilateral. Capillary filling time intact.  NEUROLOGICAL: There is loss of sensation in the forefoot and digits bilateral. Proprioception is impaired.  INTEGUMENT: Skin is warm, dry, and atrophic bilateral with diminished hair growth. Hemorrhagic hyperkeratotic areas are noted beneath the fifth metatarsal bilateral. A full thickness ulceration on the right which probes down to the level of bone at the fifth metatarsal head. A moderate amount of purulence is discharged on expression from the wound. Some mild surrounding cellulitis, erythema and edema.  MUSCULOSKELETAL: Adequate range of motion. Muscle testing is deferred. Fifth metatarsal is plantarly displaced and very prominent bilateral.   DIAGNOSTIC DATA: Labs: The patient was admitted with a white count of 10.4, in the normal range. Significant left shift in neutrophils.   X-rays: Three views of the left foot reveals some diffuse osteopenia. There does appear be some early cortical erosions present along the medial head of the fifth metatarsal on the right foot consistent with early osteomyelitis. No clear evidence of any fracture is noted. Extensive calcification of the arterial vessels is noted in the foot.   IMPRESSION: 1.  Diabetes with associated neuropathy.  2.  Osteomyelitis, right fifth metatarsal.  3.  Cellulitis with abscess, right foot.   PLAN: I spoke with the patient through an interpreter. We did discuss the need for debridement of the bone and abscess on the right foot. Discussed possible complications  including inability to the heel the wound as well as continued infection requiring further surgery or higher amputation. The patient seemed to understand the above discussion. The patient will be n.p.o. after breakfast tomorrow. Consent for debridement of bone, right foot. We will plan on surgery for tomorrow  evening.   ____________________________ Sharlotte Alamo, DPM tc:sb D: 12/02/2014 13:38:14 ET T: 12/02/2014 13:48:23 ET JOB#: [id number]  cc: Sharlotte Alamo, DPM, <Dictator> Orianna Biskup DPM ELECTRONICALLY SIGNED 12/15/2014 9:10

## 2015-02-13 NOTE — Op Note (Signed)
PATIENT NAME:  Jerry [name], CAPIZZI MR#:  [id number] DATE OF BIRTH:  11-Nov-1965  DATE OF PROCEDURE:  12/03/2014  SURGEON: Sharlotte Alamo, DPM   PREOPERATIVE DIAGNOSES:  1.  Osteomyelitis, right fifth metatarsal.  2.  Abscess, left forefoot with cellulitis.  POSTOPERATIVE DIAGNOSES: 1.  Osteomyelitis, right fifth metatarsal.  2.  Abscess, left forefoot with cellulitis.    PROCEDURE: Debridement bone, right fifth metatarsophalangeal joint and the soft tissue plantarly.   ANESTHESIA: Local MAC.   HEMOSTASIS: Pneumatic tourniquet, right ankle, 250 mmHg.   ESTIMATED BLOOD LOSS: Minimal.   PATHOLOGY: Right fifth metatarsal phalangeal joint.   CULTURES: Bone culture taken from the fifth metatarsal.   IMPLANTS:  Stimulan rapid cure beads impregnated with vancomycin.   DRAINS: None.   COMPLICATIONS: None apparent.   OPERATIVE INDICATIONS: This is a 49 year old male with chronic history of ulcerations, recently admitted for sepsis.  The wound on the right foot was probed deep with significant purulent discharge and prominence of the fifth metatarsal, and decision made for debridement of the bone. X-rays did show some apparent erosive changes along the fifth metatarsal head.   OPERATIVE PROCEDURE: The patient was taken to the Operating Room and placed on the table in the supine position. Following satisfactory sedation, the right foot was anesthetized with 10 mL of 0.5% Sensorcaine plain. A pneumatic tourniquet was applied at the level of the right ankle and the foot was prepped and draped in the usual sterile fashion. The foot was exsanguinated and the tourniquet inflated to 250 mmHg.   Attention was then directed to the right foot where an approximately 3 cm linear incision was made coursing proximal to distal, centered over the fifth metatarsal and metatarsophalangeal joint. The incision was carried sharply down to the level of the joint where the extensor tendon was transected and the  capsular tissue freed from the other normal surrounding anatomy, both medial and lateral.  Next, using a pneumatic saw, the head of the fifth metatarsal and base of the proximal phalanx were then transected and the joint was then removed in toto. There was not really noted to be any significant purulence at the level of the joint. No clear connection to the plantar ulceration.  Attention was then directed to the plantar ulceration which was debrided sharply using a rongeur and Versajet debrider on a setting of 6.  Both of these excisional type debridements as well as the bone from the dorsal incision. There was noted to be good healthy tissues following the debridement.   The dorsal incision was packed with Stimulan rapid cure beads impregnated with vancomycin and then closed in layers using 4-0 nylon vertical mattress and simple interrupted sutures. Xeroform and sterile bandages applied to the incision and the plantar ulceration area, followed by 4 x 4's, Kerlix, and an Ace wrap. The tourniquet was released and blood flow noted to return immediately to the right foot. The patient tolerated the procedure and anesthesia well and was transported to the PACU with vital signs stable and in good condition.      ____________________________ Sharlotte Alamo, DPM tc:DT D: 12/03/2014 21:51:06 ET T: 12/04/2014 09:02:35 ET JOB#: [id number]  cc: Sharlotte Alamo, DPM, <Dictator> Kharma Sampsel DPM ELECTRONICALLY SIGNED 12/15/2014 9:10

## 2015-02-13 NOTE — H&P (Signed)
PATIENT NAME:  Jerry [name], Jerry [name] MR#:  [id number] DATE OF BIRTH:  [birth year]  DATE OF ADMISSION:  12/01/2014   CHIEF COMPLAINT: Fever.   HISTORY OF PRESENT ILLNESS: This 49 year old Hispanic patient, who does not speak any English, presents to the Emergency Room from dialysis center after he was found to have nausea, vomiting and fever. On arrival to Emergency Room, the patient was found to have fever of 103.7.   The patient does not speak any [place]. He is blind. A Spanish interpreter was used for communication and history and physical.   The patient mentions that he has had about 20 episodes of vomiting since earlier today. No blood was noticed at the dialysis area. The patient is blind. He has not had any abdominal pain or diarrhea. No constipation. He has had significant cough with sputum, the color is unknown.  He does not complain of any chest pain or shortness of breath.   The patient also has had chronic foot ulcers bilaterally for which he goes to the wound care center. He has not used any recent antibiotics. The patient was admitted for similar complaints of nausea, vomiting, and fever in March 2015, which was thought to be secondary to gastrointestinal source after his blood cultures were positive for bacteroid.   REVIEW OF SYSTEMS:  CONSTITUTIONAL: Complains of fatigue.  EYES: No blurred vision, pain does he is blind.  ENT: No tinnitus, ear pain, hearing loss.  He has cough with sputum. No shortness of breath.  CARDIOVASCULAR: No chest pain, orthopnea, or edema.  GASTROINTESTINAL: Has nausea, vomiting.  GENITOURINARY:  No dysuria, hematuria.  ENDOCRINE: No polyuria, nocturia, thyroid problems.  HEMATOLOGY AND LYMPHATICS: No anemia, easy bruising or bleeding.  INTEGUMENTARY: No acne, rash, lesion.  MUSCULOSKELETAL: He has some chronic back pain.  NEUROLOGICAL:  No focal numbness, weakness.  PSYCHIATRIC: No anxiety or depression.   PAST MEDICAL HISTORY: Shows: 1.   Diabetes.  2.  Blindness.  3.  Glaucoma. 4.  End-stage renal disease.  5.  Hypertension.  6.  Bilateral foot ulcers.  7.  Neuropathy.   ALLERGIES:  No drug allergies.   FAMILY HISTORY: Positive for glaucoma in his mom.   PAST SURGICAL HISTORY: Foot ulcer debridement and fistula placement in the left upper extremity.   SOCIAL HISTORY: The patient used to smoke in the past but quit six years back. He used to drink heavily and quit at the same time.  He lives with his wife. He does not work, is on disability.   HOME MEDICATIONS:  1.  Vitamin D3 1000 sessions daily.  2.  Renvela 800 mg 2 tablets 3 times a day.  3.  Neurontin 600 mg once a day. 4.  Losartan 100 mg daily.  5.  Trazodone 50 mg daily.   PHYSICAL EXAMINATION:  VITAL SIGNS: Temperature 103.2, pulse of 98, blood pressure 178/85, saturating 88% on room air and 99% on 2 liters of oxygen.  GENERAL: Obese male patient lying in bed, overall seems comfortable.  PSYCHIATRIC: Alert and oriented x3, pleasant.  HEENT: Atraumatic, normocephalic. Oral mucosa dry and pink. Pallor positive, no icterus.  NECK: Supple. No thyromegaly. No palpable lymph nodes. Trachea midline, no JVD.  CARDIOVASCULAR: S1, S2, without any murmurs. Peripheral pulses 2+.  No edema.  RESPIRATORY: Normal work of breathing. Had some bilateral basal crackles.  GASTROINTESTINAL: Soft abdomen, nontender. Bowel sounds present. No organomegaly palpable.  SKIN: Warm and dry. No petechiae or rash.  MUSCULOSKELETAL: No joint swelling, redness, effusion of  the large joints. Normal muscle tone.  NEUROLOGICAL:  Motor strength 5 out of 5 in upper and lower extremities. Sensation to fine  touch intact all over.  LYMPHATICS:  No cervical lymphadenopathy.  EXTREMITIES: He has bilateral foot ulcers on the lateral side of the plantar surface.  Right foot ulcer has serosanguineous discharge with foul smell.   LABORATORY STUDIES:  Glucose of 115, BUN 52, creatinine 9.98, sodium  136, potassium 5.3, chloride 96. AST, ALT, alkaline phosphatase and bilirubin normal. Troponin 0.03.   WBC 10.4, hemoglobin 9.1, platelets of 155, INR 1.3.   ABG shows pH of 7.49 with pCO2 of 41 and pO2 of 127 on 2 liters oxygen. Lactic acid 1.3.     Chest x-ray, portable, shows cardiomegaly. No edema or consolidation.     EKG shows normal sinus rhythm, nothing acute.   ASSESSMENT AND PLAN:   1.  Sepsis with fever, tachycardia, in a patient with chronic bilateral foot ulcers. His right foot ulcer seems infected at this point. We will get an x-ray of the foot to rule out osteomyelitis. Start him on broad-spectrum antibiotics with vancomycin and Zosyn, get blood cultures, consult podiatry for further incision and drainage and care of the ulcer. Await blood culture results as the patient seems to have similar symptoms to his prior admission in March 2015, when he had Bacteroides bacteremia thought to be from gastrointestinal source.  2.  Acute respiratory failure. The patient's chest x-ray is clear. He tends to desaturate around 87% to 88%. The patient did get 1 liter of normal saline bolus, has not received his dialysis earlier, could have mild fluid overload with his crackles. We will continue his oxygen to keep his saturations over 88%, and wean slowly. He does have a cough, could have subtle pneumonia and will repeat chest x-ray tomorrow to look for any infiltrates.  X-ray in the ER is normal. Also with the vomiting he could have aspirated. 3.  End-stage renal disease on hemodialysis, consult nephrology as patient could not get his dialysis earlier today.  4.  Anemia of chronic disease, stable.  5.  Hypertension. Continue medications.  6.  Diabetes mellitus. Put him on sliding scale insulin along with home medications.  7. Vomiting likely from his sickness. No vomiting in ED. Monitor. Zofran PRN.  CODE STATUS: Full code.   Time Spent on this case was 45 minutes.     ____________________________ Leia Alf Challen Spainhour, MD srs:at D: 12/01/2014 16:49:04 ET T: 12/01/2014 17:14:31 ET JOB#: [id number]  cc: Alveta Heimlich R. Kamdin Follett, MD, <Dictator> Neita Carp MD ELECTRONICALLY SIGNED 12/02/2014 16:29

## 2015-02-13 NOTE — Discharge Summary (Signed)
PATIENT NAME:  Jerry [name], AMBUSH MR#:  [id number] DATE OF BIRTH:  [birth year]  DATE OF ADMISSION:  12/01/2014 DATE OF DISCHARGE:  12/07/2014  ADMITTING DIAGNOSES: Sepsis with fever, tachycardia acute respiratory failure, end-stage renal disease, anemia of chronic disease.  DISCHARGE DIAGNOSES:  1.  Group B streptococcus as well as Escherichia coli sepsis.  2.  Suspected due to right foot osteomyelitis.  3.  Right foot osteomyelitis status post debridement by Dr. Cleda Mccreedy on 12/03/2014.  4.  Acute respiratory failure with hypoxia, resolved.  5.  End-stage renal disease on hemodialysis on Mondays, Wednesdays, Fridays, last hemodialysis, today, 12/07/2014.  6.  Anemia of chronic disease.  7.  Essential hypertension and history of diabetes mellitus with hemoglobin A1c 5.3.  8.  History of blindness, glaucoma, hypertension, bilateral foot ulcers, and neuropathy,   DISCHARGE CONDITION: Stable.   DISCHARGE MEDICATIONS: The patient is to continue outpatient medications:  1.  Vitamin D3 at 1000 units once daily.  2.  Renvela 800 mg 3 tablets 3 times daily with meals.  3.  Gabapentin 300 mg 2 capsules daily at bedtime.  4.  TUMS 500 mg 2 capsules 3 times daily.  5.  Tramadol 50 mg every 6 hours as needed.  6.  Senna S 50/8.6 mg 1 tablet twice daily.  7.  Calcium acetate 667 mg 2 tablets 3 times daily with meals.  8.  Cefazolin 2 grams intravenously 3 times weekly with hemodialysis through 12/15/2014.    DISCHARGE CONDITION: Stable.   HOME OXYGEN: None.   DIET: Two gram salt, low fat, low cholesterol, carbohydrate-controlled diet, hemodiluted diet with 1500 mL fluid restriction, regular consistency diet.   ACTIVITY LIMITATIONS: As tolerated. The patient is referred to outpatient physical therapy.    FOLLOWUP APPOINTMENTS: With Dr. Cleda Mccreedy in 2 days after discharge, Dr. Ola Spurr in 1 week after discharge, [facility] in 2 days after discharge.   CONSULTANTS: Care management, social work,  Dr. Candiss Norse, Dr. Cleda Mccreedy, Dr. Juleen China, Dr. Ola Spurr, Dr. Glean Salen.   RADIOLOGIC STUDIES: Chest x-ray done 12/01/2014 showed cardiomegaly, but no edema or consolidation. Right foot, AP and lateral, x-ray, 12/01/2014, revealed findings compatible with plantar skin ulcer, negative for plain film evidence of osteomyelitis. Chest, PA and lateral, repeated 12/02/2014 showed persistence of subsegmental atelectasis of left lower lobe, stable cardiomegaly without evidence of pulmonary edema.   HOSPITAL COURSE: The patient is 49 year old Spanish male with past medical history significant for history of diabetes mellitus, end-stage renal disease on hemodialysis, who presents to the hospital with complaints of nausea, vomiting, as well as fevers. Please refer to Dr. Boykin Reaper admission note on 12/01/2014. On arrival to the hospital, the patient admitted to having at least 20 episodes of vomiting. No blood was noted in the vomitus. No abdominal pain or diarrhea were also noted. He did not complain of any chest pains or shortness of breath. He admitted to having chronic foot ulcer. On arrival to the Emergency Room, his temperature was found to be 103.2, pulse was 98, respiration rate was 18-20, blood pressure 178/85, saturation was 80% on room air and 99% on 2 L of oxygen through nasal cannula. Physical exam revealed some bibasilar crackles. The patient was noted to have bilateral foot ulcers on the lateral side of plantar surface right foot ulcer had serosanguineous drainage and foul smell. The patient's lab data done on arrival to the Emergency Room showed elevated glucose of 115, BUN and creatinine were 52 and 9.98, potassium of 5.3, otherwise BMP was unremarkable. The  patient's liver enzymes revealed albumin level of 3.2, otherwise liver enzymes were unremarkable except alkaline phosphatase elevation of 139. Troponin was 0.03. White blood cell count was normal at 10.4, hemoglobin was 9.1, platelet count was 155,000, MCV  high at 103, absolute neutrophil count was elevated at 9.6. Coagulation panel: Pro time was 16.2 and INR was 1.3. Blood cultures taken on 12/01/2014 showed no growth. Urine culture, clean urine catch showed no growth. Urinalysis was remarkable for 3 red blood cells, 12 white blood cells, 17 epithelial cells. The patient was admitted to the hospital for further evaluation. He was consulted by Dr. Cleda Mccreedy, podiatrist. Dr. Cleda Mccreedy saw patient in consultation on 12/02/2014. He felt that the patient needs debridement of the bone and abscess of the right foot. Discussed complications and the patient underwent that procedure on 12/03/2014. Dr. Cleda Mccreedy debrided bone and soft tissue and took specimen of fifth metatarsophalangeal joint of right foot due to concerns of osteomyelitis. The patient was also consulted by Dr. Ola Spurr. He was initiated on broad-spectrum antibiotic therapy and was found to have wound cultures on 12/03/2014 growing rare yeast. Upon further investigation, it appeared that the patient's blood cultures taken at [facility] hemodialysis center prior to coming to the hospital to Emergency Room where growing group B streptococcus as well as Escherichia coli both sensitive to Ancef. The patient is to initiate antibiotic therapy with Ancef according to Dr. Blane Ohara recommendation and continue those antibiotics for 2 weeks from negative blood cultures, which were on the day of admission, 12/01/2014, which means through 12/15/2014. On the day of discharge, the patient's vital signs: Temperature was 97.6, pulse was 64, respiration rate was 18, blood pressure ranging from [id number] systolic and [id number] diastolic, oxygen saturations were 94%-99% on room air at rest. Of note, as mentioned above, the patient was somewhat hypoxic on arrival to the Emergency Room; however, his chest x-ray did not show any significant changes. It was unclear why he was hypoxic; however, his hypoxia resolved with no intervention.   For his  chronic medical problems such as end-stage renal disease, the patient received hemodialysis while he was in the hospital. For anemia of chronic disease, the patient received Epogen with hemodialysis. For essential hypertension, the patient is to continue his outpatient management. He may benefit from medications to be initiated as an outpatient.   In regards to diabetes mellitus type 2, his hemoglobin A1c was checked and was found to be low at 5.4. No medication therapy was recommended, but diabetic diet was recommended for this patient.   The patient is being discharged in stable condition with the above-mentioned medications and followup.   TIME SPENT: 40 minutes.    ____________________________ Theodoro Grist, MD rv:bm D: 12/07/2014 19:02:26 ET T: 12/08/2014 04:22:26 ET JOB#: [id number]  cc: Theodoro Grist, MD, <Dictator> Sharlotte Alamo, DPM Cheral Marker. Ola Spurr, MD [place] MD ELECTRONICALLY SIGNED 12/28/2014 10:47

## 2016-12-27 ENCOUNTER — Ambulatory Visit
Admission: RE | Admit: 2016-12-27 | Discharge: 2016-12-27 | Disposition: A | Discharge status: Home / Self Care | Payer: Self-pay | Source: Ambulatory Visit | Attending: Primary Care | Admitting: Primary Care

## 2016-12-27 ENCOUNTER — Other Ambulatory Visit: Payer: Self-pay | Admitting: Primary Care

## 2016-12-27 DIAGNOSIS — M1711 Unilateral primary osteoarthritis, right knee: Secondary | ICD-10-CM | POA: Insufficient documentation

## 2016-12-27 DIAGNOSIS — M25861 Other specified joint disorders, right knee: Secondary | ICD-10-CM | POA: Insufficient documentation

## 2016-12-27 DIAGNOSIS — M25569 Pain in unspecified knee: Secondary | ICD-10-CM

## 2016-12-27 DIAGNOSIS — M25862 Other specified joint disorders, left knee: Secondary | ICD-10-CM | POA: Insufficient documentation

## 2016-12-27 DIAGNOSIS — M25562 Pain in left knee: Secondary | ICD-10-CM | POA: Insufficient documentation

## 2016-12-27 DIAGNOSIS — M25561 Pain in right knee: Secondary | ICD-10-CM | POA: Insufficient documentation

## 2018-09-02 ENCOUNTER — Encounter (INDEPENDENT_AMBULATORY_CARE_PROVIDER_SITE_OTHER): Payer: Self-pay

## 2018-09-03 ENCOUNTER — Encounter (INDEPENDENT_AMBULATORY_CARE_PROVIDER_SITE_OTHER): Payer: Self-pay

## 2018-09-16 ENCOUNTER — Other Ambulatory Visit (INDEPENDENT_AMBULATORY_CARE_PROVIDER_SITE_OTHER): Payer: Self-pay | Admitting: Nurse Practitioner

## 2018-09-17 MED ORDER — CEFAZOLIN SODIUM-DEXTROSE 1-4 GM/50ML-% IV SOLN: 1.0000 g | Freq: Once | INTRAVENOUS | Status: DC

## 2018-09-18 ENCOUNTER — Encounter
Admission: RE | Disposition: A | Discharge status: Home / Self Care | Payer: Self-pay | Source: Ambulatory Visit | Attending: Vascular Surgery

## 2018-09-18 ENCOUNTER — Ambulatory Visit
Admission: RE | Admit: 2018-09-18 | Discharge: 2018-09-18 | Disposition: A | Discharge status: Home / Self Care | Payer: Self-pay | Source: Ambulatory Visit | Attending: Vascular Surgery | Admitting: Vascular Surgery

## 2018-09-18 DIAGNOSIS — N186 End stage renal disease: Secondary | ICD-10-CM

## 2018-09-18 SURGERY — A/V FISTULAGRAM
Anesthesia: Moderate Sedation | Laterality: Left

## 2021-04-11 ENCOUNTER — Inpatient Hospital Stay
Admission: EM | Admit: 2021-04-11 | Discharge: 2021-04-13 | DRG: 391 | Disposition: A | Discharge status: Home / Self Care | Payer: Self-pay | Attending: Family Medicine | Admitting: Family Medicine

## 2021-04-11 ENCOUNTER — Emergency Department: Payer: Self-pay

## 2021-04-11 ENCOUNTER — Inpatient Hospital Stay: Payer: Self-pay | Admitting: Internal Medicine

## 2021-04-11 ENCOUNTER — Other Ambulatory Visit: Payer: Self-pay

## 2021-04-11 DIAGNOSIS — E119 Type 2 diabetes mellitus without complications: Secondary | ICD-10-CM

## 2021-04-11 DIAGNOSIS — K3580 Unspecified acute appendicitis: Secondary | ICD-10-CM

## 2021-04-11 DIAGNOSIS — R109 Unspecified abdominal pain: Secondary | ICD-10-CM | POA: Diagnosis present

## 2021-04-11 DIAGNOSIS — E861 Hypovolemia: Secondary | ICD-10-CM | POA: Diagnosis present

## 2021-04-11 DIAGNOSIS — E871 Hypo-osmolality and hyponatremia: Secondary | ICD-10-CM | POA: Diagnosis present

## 2021-04-11 DIAGNOSIS — K381 Appendicular concretions: Secondary | ICD-10-CM | POA: Diagnosis present

## 2021-04-11 DIAGNOSIS — N281 Cyst of kidney, acquired: Secondary | ICD-10-CM | POA: Diagnosis present

## 2021-04-11 DIAGNOSIS — R1013 Epigastric pain: Secondary | ICD-10-CM

## 2021-04-11 DIAGNOSIS — H409 Unspecified glaucoma: Secondary | ICD-10-CM | POA: Diagnosis present

## 2021-04-11 DIAGNOSIS — R7989 Other specified abnormal findings of blood chemistry: Secondary | ICD-10-CM

## 2021-04-11 DIAGNOSIS — Z794 Long term (current) use of insulin: Secondary | ICD-10-CM

## 2021-04-11 DIAGNOSIS — I120 Hypertensive chronic kidney disease with stage 5 chronic kidney disease or end stage renal disease: Secondary | ICD-10-CM | POA: Diagnosis present

## 2021-04-11 DIAGNOSIS — N2581 Secondary hyperparathyroidism of renal origin: Secondary | ICD-10-CM | POA: Diagnosis present

## 2021-04-11 DIAGNOSIS — Z992 Dependence on renal dialysis: Secondary | ICD-10-CM

## 2021-04-11 DIAGNOSIS — E860 Dehydration: Secondary | ICD-10-CM | POA: Diagnosis present

## 2021-04-11 DIAGNOSIS — E8770 Fluid overload, unspecified: Secondary | ICD-10-CM | POA: Diagnosis present

## 2021-04-11 DIAGNOSIS — N186 End stage renal disease: Secondary | ICD-10-CM | POA: Diagnosis present

## 2021-04-11 DIAGNOSIS — H543 Unqualified visual loss, both eyes: Secondary | ICD-10-CM | POA: Diagnosis present

## 2021-04-11 DIAGNOSIS — Z20822 Contact with and (suspected) exposure to covid-19: Secondary | ICD-10-CM | POA: Diagnosis present

## 2021-04-11 DIAGNOSIS — Z841 Family history of disorders of kidney and ureter: Secondary | ICD-10-CM

## 2021-04-11 DIAGNOSIS — Z83511 Family history of glaucoma: Secondary | ICD-10-CM

## 2021-04-11 DIAGNOSIS — E1122 Type 2 diabetes mellitus with diabetic chronic kidney disease: Secondary | ICD-10-CM | POA: Diagnosis present

## 2021-04-11 DIAGNOSIS — K529 Noninfective gastroenteritis and colitis, unspecified: Principal | ICD-10-CM | POA: Diagnosis present

## 2021-04-11 DIAGNOSIS — R778 Other specified abnormalities of plasma proteins: Secondary | ICD-10-CM

## 2021-04-11 DIAGNOSIS — Z79899 Other long term (current) drug therapy: Secondary | ICD-10-CM

## 2021-04-11 DIAGNOSIS — I10 Essential (primary) hypertension: Secondary | ICD-10-CM

## 2021-04-11 DIAGNOSIS — E1140 Type 2 diabetes mellitus with diabetic neuropathy, unspecified: Secondary | ICD-10-CM | POA: Diagnosis present

## 2021-04-11 DIAGNOSIS — D631 Anemia in chronic kidney disease: Secondary | ICD-10-CM | POA: Diagnosis present

## 2021-04-11 HISTORY — DX: Essential (primary) hypertension: I10

## 2021-04-11 HISTORY — DX: End stage renal disease: Z99.2

## 2021-04-11 HISTORY — DX: End stage renal disease: N18.6

## 2021-04-11 HISTORY — DX: Type 2 diabetes mellitus without complications: E11.9

## 2021-04-11 HISTORY — DX: Disorder of kidney and ureter, unspecified: N28.9

## 2021-04-11 LAB — RESP PANEL BY RT-PCR (FLU A&B, COVID) ARPGX2
Influenza A by PCR: NEGATIVE
Influenza B by PCR: NEGATIVE
SARS Coronavirus 2 by RT PCR: NEGATIVE

## 2021-04-11 LAB — COMPREHENSIVE METABOLIC PANEL
ALT: 16 U/L (ref 0–44)
AST: 13 U/L — ABNORMAL LOW (ref 15–41)
Albumin: 3.9 g/dL (ref 3.5–5.0)
Alkaline Phosphatase: 65 U/L (ref 38–126)
Anion gap: 17 — ABNORMAL HIGH (ref 5–15)
BUN: 106 mg/dL — ABNORMAL HIGH (ref 6–20)
CO2: 18 mmol/L — ABNORMAL LOW (ref 22–32)
Calcium: 9.7 mg/dL (ref 8.9–10.3)
Chloride: 93 mmol/L — ABNORMAL LOW (ref 98–111)
Creatinine, Ser: 14.08 mg/dL — ABNORMAL HIGH (ref 0.61–1.24)
GFR, Estimated: 4 mL/min — ABNORMAL LOW (ref >60)
Glucose, Bld: 120 mg/dL — ABNORMAL HIGH (ref 70–99)
Potassium: 5.2 mmol/L — ABNORMAL HIGH (ref 3.5–5.1)
Sodium: 128 mmol/L — ABNORMAL LOW (ref 135–145)
Total Bilirubin: 1 mg/dL (ref 0.3–1.2)
Total Protein: 7.9 g/dL (ref 6.5–8.1)

## 2021-04-11 LAB — CBC
HCT: 28.4 % — ABNORMAL LOW (ref 39.0–52.0)
Hemoglobin: 9.5 g/dL — ABNORMAL LOW (ref 13.0–17.0)
MCH: 33.1 pg (ref 26.0–34.0)
MCHC: 33.5 g/dL (ref 30.0–36.0)
MCV: 99 fL (ref 80.0–100.0)
Platelets: 159 10*3/uL (ref 150–400)
RBC: 2.87 MIL/uL — ABNORMAL LOW (ref 4.22–5.81)
RDW: 14.4 % (ref 11.5–15.5)
WBC: 6.5 10*3/uL (ref 4.0–10.5)
nRBC: 0 % (ref 0.0–0.2)

## 2021-04-11 LAB — BRAIN NATRIURETIC PEPTIDE: B Natriuretic Peptide: >4500 pg/mL — ABNORMAL HIGH (ref 0.0–100.0)

## 2021-04-11 LAB — PROCALCITONIN: Procalcitonin: 0.51 ng/mL

## 2021-04-11 LAB — TROPONIN I (HIGH SENSITIVITY)
Troponin I (High Sensitivity): 87 ng/L — ABNORMAL HIGH (ref <18)
Troponin I (High Sensitivity): 85 ng/L — ABNORMAL HIGH (ref <18)

## 2021-04-11 LAB — LIPASE, BLOOD: Lipase: 26 U/L (ref 11–51)

## 2021-04-11 LAB — CBG MONITORING, ED: Glucose-Capillary: 139 mg/dL — ABNORMAL HIGH (ref 70–99)

## 2021-04-11 MED ORDER — HYDRALAZINE HCL 20 MG/ML IJ SOLN
10.0000 mg | Freq: Four times a day (QID) | INTRAMUSCULAR | Status: DC | PRN
  Administered 2021-04-11 – 2021-04-13 (×2): 10 mg via INTRAVENOUS
  Filled 2021-04-11 (×2): qty 1

## 2021-04-11 MED ORDER — HEPARIN SODIUM (PORCINE) 5000 UNIT/ML IJ SOLN
5000.0000 [IU] | Freq: Three times a day (TID) | INTRAMUSCULAR | Status: DC
  Administered 2021-04-11 – 2021-04-13 (×5): 5000 [IU] via SUBCUTANEOUS
  Filled 2021-04-11 (×5): qty 1

## 2021-04-11 MED ORDER — SODIUM CHLORIDE 0.9 % IV SOLN: Freq: Once | INTRAVENOUS | Status: AC

## 2021-04-11 MED ORDER — ALBUTEROL SULFATE (2.5 MG/3ML) 0.083% IN NEBU
2.5000 mg | INHALATION_SOLUTION | Freq: Four times a day (QID) | RESPIRATORY_TRACT | Status: DC
  Filled 2021-04-11 (×3): qty 3

## 2021-04-11 MED ORDER — ACETAMINOPHEN 650 MG RE SUPP: 650.0000 mg | Freq: Four times a day (QID) | RECTAL | Status: DC | PRN

## 2021-04-11 MED ORDER — INSULIN ASPART 100 UNIT/ML IJ SOLN: 0.0000 [IU] | Freq: Three times a day (TID) | INTRAMUSCULAR | Status: DC

## 2021-04-11 MED ORDER — FUROSEMIDE 10 MG/ML IJ SOLN
80.0000 mg | Freq: Once | INTRAMUSCULAR | Status: AC
  Administered 2021-04-11: 80 mg via INTRAVENOUS
  Filled 2021-04-11: qty 8

## 2021-04-11 MED ORDER — LABETALOL HCL 5 MG/ML IV SOLN
10.0000 mg | Freq: Once | INTRAVENOUS | Status: AC
  Administered 2021-04-11: 10 mg via INTRAVENOUS
  Filled 2021-04-11: qty 4

## 2021-04-11 MED ORDER — HYDRALAZINE HCL 10 MG PO TABS
10.0000 mg | ORAL_TABLET | Freq: Three times a day (TID) | ORAL | Status: DC
  Administered 2021-04-12 – 2021-04-13 (×3): 10 mg via ORAL
  Filled 2021-04-11 (×7): qty 1

## 2021-04-11 MED ORDER — ACETAMINOPHEN 325 MG PO TABS
650.0000 mg | ORAL_TABLET | Freq: Four times a day (QID) | ORAL | Status: DC | PRN
  Administered 2021-04-12: 650 mg via ORAL
  Filled 2021-04-11: qty 2

## 2021-04-11 NOTE — H&P (Signed)
History and Physical    Jerry [name] [id number] DOB: 02/08/1966 DOA: 04/11/2021  PCP: [facility]    Patient coming from:  Home    Chief Complaint:  Abdominal pain   HPI: Jerry [name] is a 55 y.o. male seen in ed with complaints of abdominal pain, Cough, burning chest pain, generalized myalgia, abdominal pain with nonbloody diarrhea.  Patient missed his dialysis because he was feeling ill.  Patient reports several episodes of diarrhea today.  Patient also has history of chronic back pain, no fevers chills sore throat rashes.   Patient has not been seen here since 2016 when he was discharged.pt reports stomach pain : Since yesterday while at home / RLQ / generalized abdomen and it started in his chest and goes down belly button,  intermittent ,unable to describe nature of pain , coughing makes it worse , better with laying down, no blood in stool pt has had diarrhea,no bc or goody's powder.   Pt has past medical history of foot ulcers, hypertension, end-stage renal disease on hemodialysis, diabetes mellitus type 2, glaucoma, blindness, neuropathy, anemia of chronic disease.PT CLARIFIES THAT HE IS NOT HAVING ANY CHEST PAIN JUST ABDOMINAL PAIN.   ED Course:  Vitals:   04/11/21 1808 04/11/21 1900 04/11/21 1930 04/11/21 2000  BP: (!) 195/90 (!) 217/93 (!) 199/84 (!) 191/78  Pulse: 78 82 72 69  Resp: '17 18  16  '$ Temp:      TempSrc:      SpO2: 99% 99% 100% 98%  In the emergency room patient is alert awake afebrile, hypertensive. Blood work shows hyponatremia at 128, potassium 5.2, bicarb of 18, creatinine of 14 point 8, anion gap of 17.  CBC shows hemoglobin of 9.5, respiratory panel for flu and COVID is negative , lipase is pending, BNP of 4500, troponin of 87.  CT of the abdomen and pelvis without contrast shows a distended appendix 7 mm with a small appendix cholelith, adjacent periappendiceal inflammatory change, no abscess, tight foci of  gas, atrophied kidney but multiple bilateral cyst, cardiomegaly with pulmonary edema in the lung bases.  In the emergency room General surgery has been consulted and medicine to admit patient.  In the ED patient was on Lasix 80 mg as he still makes urine, labetalol 10 mg for his hypertension.  Review of Systems:  Review of Systems  Constitutional: Negative.   HENT: Negative.    Eyes:        N/A  Respiratory:  Positive for cough.   Cardiovascular: Negative.   Gastrointestinal:  Positive for abdominal pain.  Genitourinary: Negative.   Musculoskeletal: Negative.   Skin: Negative.   Neurological: Negative.   Psychiatric/Behavioral: Negative.      Past Medical History:  Diagnosis Date   Diabetes mellitus ([place])    ESRD on hemodialysis ([place])    Hypertension    Renal disorder     Past Surgical History:  Procedure Laterality Date   AV FISTULA PLACEMENT Left    FOOT SURGERY       reports that he has never smoked. He has never used smokeless tobacco. He reports current alcohol use. He reports that he does not use drugs.  Not on File  Family History  Problem Relation Age of Onset   Kidney failure Mother    Glaucoma Mother    Kidney failure Father     Prior to Admission medications   Not on File    Physical Exam: Vitals:   04/11/21  1808 04/11/21 1900 04/11/21 1930 04/11/21 2000  BP: (!) 195/90 (!) 217/93 (!) 199/84 (!) 191/78  Pulse: 78 82 72 69  Resp: '17 18  16  '$ Temp:      TempSrc:      SpO2: 99% 99% 100% 98%   Physical Exam Vitals reviewed.  Constitutional:      General: He is not in acute distress.    Appearance: He is well-developed. He is not ill-appearing.  HENT:     Head: Normocephalic and atraumatic.     Right Ear: External ear normal.     Left Ear: External ear normal.     Nose: Nose normal.     Mouth/Throat:     Mouth: Mucous membranes are dry.  Eyes:     Pupils: Pupils are equal, round, and reactive to light.  Cardiovascular:     Rate and Rhythm:  Normal rate and regular rhythm.     Pulses: Normal pulses.     Heart sounds: Normal heart sounds.  Pulmonary:     Effort: Pulmonary effort is normal.     Breath sounds: Normal breath sounds.  Abdominal:     General: Bowel sounds are normal. There is no distension.     Palpations: Abdomen is soft. There is no hepatomegaly or splenomegaly.     Tenderness: There is abdominal tenderness in the epigastric area, periumbilical area and suprapubic area.    Musculoskeletal:     Right lower leg: No edema.     Left lower leg: No edema.  Skin:    General: Skin is warm.  Neurological:     General: No focal deficit present.     Mental Status: He is alert and oriented to person, place, and time.  Psychiatric:        Mood and Affect: Mood normal.        Behavior: Behavior normal.     Labs on Admission: I have personally reviewed following labs and imaging studies  No results for input(s): CKTOTAL, CKMB, TROPONINI in the last 72 hours. Lab Results  Component Value Date   WBC 6.5 04/11/2021   HGB 9.5 (L) 04/11/2021   HCT 28.4 (L) 04/11/2021   MCV 99.0 04/11/2021   PLT 159 04/11/2021    Recent Labs  Lab 04/11/21 1614  NA 128*  K 5.2*  CL 93*  CO2 18*  BUN 106*  CREATININE 14.08*  CALCIUM 9.7  PROT 7.9  BILITOT 1.0  ALKPHOS 65  ALT 16  AST 13*  GLUCOSE 120*   No results found for: CHOL, HDL, LDLCALC, TRIG No results found for: DDIMER Invalid input(s): POCBNP  Urinalysis    Component Value Date/Time   COLORURINE Yellow 12/30/2013 1725   APPEARANCEUR Clear 12/30/2013 1725   LABSPEC 1.018 12/30/2013 1725   PHURINE 6.0 12/30/2013 1725   GLUCOSEU >=500 12/30/2013 1725   HGBUR Negative 12/30/2013 1725   BILIRUBINUR Negative 12/30/2013 1725   KETONESUR Negative 12/30/2013 1725   PROTEINUR >=500 12/30/2013 1725   NITRITE Negative 12/30/2013 1725   LEUKOCYTESUR Negative 12/30/2013 1725    COVID-19 Labs No results for input(s): DDIMER, FERRITIN, LDH, CRP in the last 72  hours. Lab Results  Component Value Date   [place] NEGATIVE 04/11/2021    Radiological Exams on Admission: CT ABDOMEN PELVIS WO CONTRAST  Result Date: 04/11/2021 CLINICAL DATA:  Abdominal pain, acute, nonlocalized EXAM: CT ABDOMEN AND PELVIS WITHOUT CONTRAST TECHNIQUE: Multidetector CT imaging of the abdomen and pelvis was performed following the standard protocol without  IV contrast. COMPARISON:  CT 12/30/2013 FINDINGS: Lower chest: Mild interlobular septal thickening and ground-glass opacities in the lung bases, likely edema. Enlarged heart with mitral annular, coronary artery and aortic valve calcifications. Hepatobiliary: Mild periportal edema, likely from volume overload. No gallstones, gallbladder wall thickening, or biliary dilatation. Pancreas: Unremarkable. No pancreatic ductal dilatation or surrounding inflammatory changes. Spleen: Normal in size without focal abnormality. Adrenals/Urinary Tract: Adrenal glands are unremarkable. Atrophic kidneys with multiple bilateral renal cysts, consistent with acquired cystic renal disease of dialysis, this is new since 2015. Bladder is nondistended. Stomach/Bowel: Small hiatal hernia. The stomach is unremarkable. There is no evidence of bowel obstruction. There are two appendicoliths, one near the base of the appendix measuring 3 mm (axial image 68) and another distally measuring 3 mm (axial image 72). The appendix is distended measuring 7 mm, with some periappendiceal inflammatory change. There is no adjacent abscess. There is a punctate foci of gas within the appendix distally. Vascular/Lymphatic: Aortoiliac atherosclerotic calcifications. There is no lymphadenopathy. Reproductive: Mildly enlarged prostate. Other: Small fat containing right inguinal hernia. Musculoskeletal: No acute or significant osseous findings. IMPRESSION: Distended appendix measuring 7 mm with small appendicoliths and adjacent periappendiceal inflammatory change. No adjacent  abscess. Findings could reflect early appendicitis, although a punctate foci of gas is seen within the distal appendix. Atrophic kidneys with multiple bilateral cysts consistent with a acquired cystic renal disease of dialysis. Cardiomegaly with pulmonary edema noted in the lung bases. Appendix: Location: Anterior pelvis Diameter: 7 mm Appendicolith: Present, two measuring 43m each Mucosal hyper-enhancement: Minimal Extraluminal gas: None Periappendiceal collection: None Electronically Signed   By: JMaurine Simmering  On: 04/11/2021 18:06   DG Chest Portable 1 View  Result Date: 04/11/2021 CLINICAL DATA:  55year old male with chest pain and cough. EXAM: PORTABLE CHEST 1 VIEW COMPARISON:  Chest radiograph dated 01/19/2015 FINDINGS: There is cardiomegaly with vascular congestion. No focal consolidation, pleural effusion, pneumothorax. Atherosclerotic calcification of the aortic arch. No acute osseous pathology. IMPRESSION: Cardiomegaly with vascular congestion. No focal consolidation. Electronically Signed   By: AAnner CreteM.D.   On: 04/11/2021 17:41    EKG: Independently reviewed.  Sinus rhythm 76 and normal axis with no st t wave changes.      Assessment/Plan Principal Problem:   Abdominal pain Active Problems:   Hypertension   [place]   [place]   Hyponatremia Abdominal pain: Admit patient to medical unit with continuous cardiac monitoring. As needed Tylenol and morphine for abdominal pain as needed. PPI and antiemetics as needed. No white count or fever and therefore no antibiotics are initiated. General surgery consulted.  Hypertension: We will start patient on hydralazine 25 mg p.o. q. 8. Carb consistent, cardiac, renal HD diet, n.p.o. after midnight.  Diabetes mellitus type 2, Awaiting med reconciliation no meds in chart.  We will start patient on glycemic protocol and Accu-Cheks and obtain an A1c and cover with sliding scale  insulin.  End-stage renal disease on hemodialysis: Nephrology has been consulted and Dr. SCandiss Norsenotified for patient needing dialysis as he has missed his dialysis session today.  Hyponatremia: Secondary to dehydration attributed to acute hypovolemic hyponatremia.  DVT prophylaxis:  SCD'd/Heparin.    Code Status:  Full code    Family Communication:  JAlphonzo Grieve(Spouse)  [phone](Home Phone)   Disposition Plan:  Home    Consults called:  General surgery- Dr. PDahlia Byes  Admission status: Inpatient     EPara SkeansMD [facility] [phone]  to contact the [facility] Attending or Consulting provider [place] or covering provider during after hours [place], for this patient.    Check the care team in [facility] and look for a) attending/consulting [facility] provider listed and b) the [facility] team listed Log into [email or web address] and use [place]'s universal password to access. If you do not have the password, please contact the hospital operator. Locate the [facility] provider you are looking for under [facility] and page to a number that you can be directly reached. If you still have difficulty reaching the provider, please page the [facility] (Director on Call) for the Hospitalists listed on amion for assistance. [email or web address] Password [facility] 04/11/2021, 8:41 PM

## 2021-04-11 NOTE — Progress Notes (Signed)
55 yo ESRD on HD, presents w cough, diarrhea and abd pain. CT scan personally reviewed, Non specific changes on the appendix. Not clear cut for appendicitis. Appendicolith. Per Dr. Tamala Hakeen not peritonitic.  Needs admission, medical optimization and serial abdominal exam. Needs to r/o infectious colitis. No clear cut appendicitis.

## 2021-04-11 NOTE — ED Notes (Signed)
MD at bedside with interpreter services. Pt states he has had severe diarrhea and cough x 2 days with abdominal pain and back pain. His stomach hurts when he has a bowel movement. Denies fever/vomiting today but states that he was vomiting on Monday. He is a dialysis patient M/W/F and did not receive his dialysis treatment yesterday because he felt unwell. He does not produce urine other than a few drops each day. Denies additional symptoms of concerns. Reports hx of arthritis, DDD, and near-blindness.

## 2021-04-11 NOTE — ED Triage Notes (Signed)
Pt to ED with wife for upper abd pain and diarrhea since yesterday. States abdomen only hurts when trying to use the bathroom and coughing.  Dialysis pt, MWF. Missed yesterday

## 2021-04-11 NOTE — ED Notes (Signed)
Patient transported to CT 

## 2021-04-11 NOTE — ED Provider Notes (Signed)
[facility] Emergency Department Provider Note  ____________________________________________   Event Date/Time   First MD Initiated Contact with Patient 04/11/21 1652     (approximate)  I have reviewed the triage vital signs and the nursing notes.   HISTORY  Chief Complaint Abdominal Pain   HPI Jerry [name] is a 55 y.o. 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  Patient states she did not go to dialysis yesterday because she was feeling too badly.  States his cough is gotten better but he still has some burning chest pain and general abdominal pain has having several diarrhea episodes today.  He still makes a couple drops of urine but has not noticed any burning or blood in this recently.  He has chronic back pain has no change in this.  No new headache, earache, sore throat, fevers, shortness of breath, rash or extremity pain.  No recent travel outside [place].  No other acute concerns at this time.         Past Medical History:  Diagnosis Date   Diabetes mellitus ([place])    ESRD on hemodialysis ([place])    Hypertension    Renal disorder     Patient Active Problem List   Diagnosis Date Noted   ESRD on hemodialysis ( [place]) 04/11/2021   Abdominal pain 04/11/2021   Hyponatremia 04/11/2021   Diabetes mellitus ([place]) 07/21/2013   Hypertension 02/18/2013     Past Surgical History:  Procedure Laterality Date   AV FISTULA PLACEMENT Left    FOOT SURGERY      Prior to Admission medications   Medication Sig Start Date End Date Taking? Authorizing Provider  gabapentin (NEURONTIN) 300 MG capsule Take by mouth. 03/13/11  Yes [provider]  RENVELA 800 MG tablet Take 1,600 mg by mouth 2 (two) times daily. 10/24/20   Yes [provider]  calcium carbonate (TUMS EX) 750 MG chewable tablet Chew by mouth.    [provider]  traMADol (ULTRAM) 50 MG tablet Take 50 mg by mouth every 6 (six) hours as needed. 01/18/21   [provider]    Allergies Patient has no known allergies.  Family History  Problem Relation Age of Onset   Kidney failure Mother    Glaucoma Mother    Kidney failure Father     Social History Social History   Tobacco Use   Smoking status: Never   Smokeless tobacco: Never  Vaping Use   Vaping Use: Never used  Substance Use Topics   Alcohol use: Yes    Comment: occasional   Drug use: Never    Review of Systems  Review of Systems  Constitutional:  Negative for chills and fever.  HENT:  Negative for sore throat.   Eyes:  Negative for pain.  Respiratory:  Positive for cough. Negative for stridor.   Cardiovascular:  Positive for chest pain.  Gastrointestinal:  Positive for abdominal pain and diarrhea. Negative for vomiting.  Genitourinary:  Negative for dysuria.  Musculoskeletal:  Negative for myalgias.  Skin:  Negative for rash.  Neurological:  Negative for seizures, loss of consciousness and headaches.  Psychiatric/Behavioral:  Negative for suicidal ideas.   All other systems reviewed and are negative.    ____________________________________________   PHYSICAL EXAM:  VITAL SIGNS: ED Triage Vitals [  04/11/21 1610]  Enc Vitals Group     BP (!) 182/83     Pulse Rate 77     Resp 20     Temp 98.8 F (37.1 C)     Temp Source Oral     SpO2 97 %     Weight      Height      Head Circumference      Peak Flow      Pain Score 5     Pain Loc      Pain Edu?      Excl. in [place]?    Vitals:   04/11/21 2000 04/11/21 2202  BP: (!) 191/78 (!) 183/76  Pulse: 69   Resp: 16   Temp:    SpO2: 98%    Physical Exam Vitals and nursing note reviewed.  Constitutional:      Appearance: He is well-developed.  HENT:     Head: Normocephalic and  atraumatic.     Right Ear: External ear normal.     Left Ear: External ear normal.     Nose: Nose normal.  Eyes:     Conjunctiva/sclera: Conjunctivae normal.  Cardiovascular:     Rate and Rhythm: Normal rate and regular rhythm.     Heart sounds: No murmur heard. Pulmonary:     Effort: Pulmonary effort is normal. No respiratory distress.     Breath sounds: Normal breath sounds.  Abdominal:     Palpations: Abdomen is soft.     Tenderness: There is generalized abdominal tenderness. There is no right CVA tenderness or left CVA tenderness.  Musculoskeletal:     Cervical back: Neck supple.     Right lower leg: Edema present.     Left lower leg: Edema present.  Skin:    General: Skin is warm and dry.  Neurological:     Mental Status: He is alert and oriented to person, place, and time.  Psychiatric:        Mood and Affect: Mood normal.    Abdomen diffusely tender throughout.  Lungs with some rhonchi at the bases and diminished sounds but otherwise unremarkable. ____________________________________________   LABS (all labs ordered are listed, but only abnormal results are displayed)  Labs Reviewed  COMPREHENSIVE METABOLIC PANEL - Abnormal; Notable for the following components:      Result Value   Sodium 128 (*)    Potassium 5.2 (*)    Chloride 93 (*)    CO2 18 (*)    Glucose, Bld 120 (*)    BUN 106 (*)    Creatinine, Ser 14.08 (*)    AST 13 (*)    GFR, Estimated 4 (*)    Anion gap 17 (*)    All other components within normal limits  CBC - Abnormal; Notable for the following components:   RBC 2.87 (*)    Hemoglobin 9.5 (*)    HCT 28.4 (*)    All other components within normal limits  BRAIN NATRIURETIC PEPTIDE - Abnormal; Notable for the following components:   B Natriuretic Peptide >4,500.0 (*)    All other components within normal limits  TROPONIN I (HIGH SENSITIVITY) - Abnormal; Notable for the following components:   Troponin I (High Sensitivity) 87 (*)    All other  components within normal limits  TROPONIN I (HIGH SENSITIVITY) - Abnormal; Notable for the following components:   Troponin I (High Sensitivity) 85 (*)    All other components within normal limits  RESP PANEL BY  RT-PCR (FLU A&B, COVID) ARPGX2  C DIFFICILE QUICK SCREEN W PCR REFLEX    LIPASE, BLOOD  PROCALCITONIN  URINALYSIS, COMPLETE (UACMP) WITH MICROSCOPIC  HEMOGLOBIN A1C  HIV ANTIBODY (ROUTINE TESTING W REFLEX)  COMPREHENSIVE METABOLIC PANEL  CBC   ____________________________________________  EKG  Sinus rhythm with a ventricular to 76, normal axis, unremarkable intervals with exception of QRS which is just barely prolonged at 122 without appearance of acute ischemia or significant underlying arrhythmia. ____________________________________________  RADIOLOGY  ED MD interpretation: Chest x-ray with evidence of cardiomegaly and some vascular congestion without focal consolidation, effusion, pneumothorax or other clear acute thoracic process.  CT abdomen pelvis remarkable for distended appendix at 7 mm with appendicolith and adjacent periappendiceal inflammation without abscess or perforation.  Atrophy of bilateral kidneys with multiple renal cysts.  Cardiomegaly and pulmonary edema.  No other clear acute abdominopelvic process.  Official radiology report(s): CT ABDOMEN PELVIS WO CONTRAST  Result Date: 04/11/2021 CLINICAL DATA:  Abdominal pain, acute, nonlocalized EXAM: CT ABDOMEN AND PELVIS WITHOUT CONTRAST TECHNIQUE: Multidetector CT imaging of the abdomen and pelvis was performed following the standard protocol without IV contrast. COMPARISON:  CT 12/30/2013 FINDINGS: Lower chest: Mild interlobular septal thickening and ground-glass opacities in the lung bases, likely edema. Enlarged heart with mitral annular, coronary artery and aortic valve calcifications. Hepatobiliary: Mild periportal edema, likely from volume overload. No gallstones, gallbladder wall thickening, or biliary  dilatation. Pancreas: Unremarkable. No pancreatic ductal dilatation or surrounding inflammatory changes. Spleen: Normal in size without focal abnormality. Adrenals/Urinary Tract: Adrenal glands are unremarkable. Atrophic kidneys with multiple bilateral renal cysts, consistent with acquired cystic renal disease of dialysis, this is new since 2015. Bladder is nondistended. Stomach/Bowel: Small hiatal hernia. The stomach is unremarkable. There is no evidence of bowel obstruction. There are two appendicoliths, one near the base of the appendix measuring 3 mm (axial image 68) and another distally measuring 3 mm (axial image 72). The appendix is distended measuring 7 mm, with some periappendiceal inflammatory change. There is no adjacent abscess. There is a punctate foci of gas within the appendix distally. Vascular/Lymphatic: Aortoiliac atherosclerotic calcifications. There is no lymphadenopathy. Reproductive: Mildly enlarged prostate. Other: Small fat containing right inguinal hernia. Musculoskeletal: No acute or significant osseous findings. IMPRESSION: Distended appendix measuring 7 mm with small appendicoliths and adjacent periappendiceal inflammatory change. No adjacent abscess. Findings could reflect early appendicitis, although a punctate foci of gas is seen within the distal appendix. Atrophic kidneys with multiple bilateral cysts consistent with a acquired cystic renal disease of dialysis. Cardiomegaly with pulmonary edema noted in the lung bases. Appendix: Location: Anterior pelvis Diameter: 7 mm Appendicolith: Present, two measuring 81m each Mucosal hyper-enhancement: Minimal Extraluminal gas: None Periappendiceal collection: None Electronically Signed   By: JMaurine Simmering  On: 04/11/2021 18:06   DG Chest Portable 1 View  Result Date: 04/11/2021 CLINICAL DATA:  55year old male with chest pain and cough. EXAM: PORTABLE CHEST 1 VIEW COMPARISON:  Chest radiograph dated 01/19/2015 FINDINGS: There is  cardiomegaly with vascular congestion. No focal consolidation, pleural effusion, pneumothorax. Atherosclerotic calcification of the aortic arch. No acute osseous pathology. IMPRESSION: Cardiomegaly with vascular congestion. No focal consolidation. Electronically Signed   By: AAnner CreteM.D.   On: 04/11/2021 17:41    ____________________________________________   PROCEDURES  Procedure(s) performed (including Critical Care):  .1-3 Lead EKG Interpretation  Date/Time: 04/11/2021 11:15 PM Performed by: SLucrezia Starch MD Authorized by: SLucrezia Starch MD     Interpretation: normal     ECG  rate assessment: normal     Rhythm: sinus rhythm     Ectopy: none     Conduction: normal     ____________________________________________   INITIAL IMPRESSION / ASSESSMENT AND PLAN / ED COURSE      Patient presents with above-stated history exam for assessment of some burning in his chest associate with cough yesterday that is improved today as well as general abdominal crampiness diarrhea that started yesterday and has been persistent today.  He did miss dialysis yesterday.  He was last dialyzed on Friday.  On arrival he is hypertensive with BP of 182/83 with otherwise stable vital signs on room air.  His lungs are clear bilaterally and some mild generalized tenderness throughout his abdomen but is not peritonitis or guarding.  No significant CVA tenderness.  Primary differential includes acute infectious gastroenteritis with possible component of esophagitis given burning chest pain.  Also obtain ECG and troponin to assess for possible atypical presentation of ACS.  Patient states this cough has improved today we will trend chest x-ray to assess for evidence of pneumonia or volume overload.  Differential also includes cholecystitis, pancreatitis, diverticulitis and less likely kidney stone.  Chest x-ray has cardiomegaly and some mild pulmonary edema without evidence of pneumothorax, large  effusion or clear focal consolidation.  Lipase of 76 not consistent with acute pancreatitis.  CMP with NA of 128, potassium of 5.2, bicarb 18, BUN of 106, creatinine of 14.08 and anion gap of 17 without any other significant electrolyte or metabolic derangements.  No evidence of hepatitis or cholestasis.  Given absence of focal tenderness in the right upper quadrant lower suspicion for acute cholecystitis at this time.  CBC without leukocytosis and hemoglobin of 9.5.  Last hemoglobin I can see on outside records has been in the sixes so this could represent mild hemoconcentration but does not present in acute anemia.  BNP greater than 4500.  Troponins 87 and 85/2 hours with otherwise relatively reassuring EKG suspect likely represents some demand ischemia in the setting of volume overload from missed dialysis.  Given patient does make some urine we will give a dose of Lasix.  CT abdomen pelvis remarkable for distended appendix at 7 mm with appendicolith and adjacent periappendiceal inflammation without abscess or perforation.  Atrophy of bilateral kidneys with multiple renal cysts.  Cardiomegaly and pulmonary edema.  No other clear acute abdominopelvic process.  Given findings on CT concerning for possible appendicitis and discussed with on-call surgeon Dr. Perrin Maltese recommended medicine admission for medical optimization but that he would follow patient.  Patient admitted to medicine service for further evaluation and management.   ____________________________________________   FINAL CLINICAL IMPRESSION(S) / ED DIAGNOSES  Final diagnoses:  ESRD (end stage renal disease) ([place])  Acute appendicitis, unspecified acute appendicitis type  Troponin I above reference range  Elevated brain natriuretic peptide (BNP) level    Medications  insulin aspart (novoLOG) injection 0-6 Units (has no administration in time range)  heparin injection 5,000 Units (5,000 Units Subcutaneous Given 04/11/21 2202)   acetaminophen (TYLENOL) tablet 650 mg (has no administration in time range)    Or  acetaminophen (TYLENOL) suppository 650 mg (has no administration in time range)  hydrALAZINE (APRESOLINE) tablet 10 mg (has no administration in time range)  albuterol (PROVENTIL) (2.5 MG/3ML) 0.083% nebulizer solution 2.5 mg (2.5 mg Nebulization Patient Refused/Not Given 04/11/21 2202)  hydrALAZINE (APRESOLINE) injection 10 mg (10 mg Intravenous Given 04/11/21 2202)  labetalol (NORMODYNE) injection 10 mg (10 mg Intravenous Given 04/11/21 1927)  furosemide (LASIX) injection 80 mg (80 mg Intravenous Given 04/11/21 1924)  0.9 %  sodium chloride infusion ( Intravenous New Bag/Given 04/11/21 2203)     ED Discharge Orders     None        Note:  This document was prepared using Dragon voice recognition software and may include unintentional dictation errors.    Lucrezia Starch, MD 04/11/21 [phone]

## 2021-04-12 DIAGNOSIS — R1030 Lower abdominal pain, unspecified: Secondary | ICD-10-CM

## 2021-04-12 DIAGNOSIS — N186 End stage renal disease: Secondary | ICD-10-CM

## 2021-04-12 DIAGNOSIS — R109 Unspecified abdominal pain: Secondary | ICD-10-CM

## 2021-04-12 DIAGNOSIS — Z992 Dependence on renal dialysis: Secondary | ICD-10-CM

## 2021-04-12 LAB — COMPREHENSIVE METABOLIC PANEL
ALT: 13 U/L (ref 0–44)
AST: 7 U/L — ABNORMAL LOW (ref 15–41)
Albumin: 3.7 g/dL (ref 3.5–5.0)
Alkaline Phosphatase: 77 U/L (ref 38–126)
Anion gap: 23 — ABNORMAL HIGH (ref 5–15)
BUN: 123 mg/dL — ABNORMAL HIGH (ref 6–20)
CO2: 15 mmol/L — ABNORMAL LOW (ref 22–32)
Calcium: 9.1 mg/dL (ref 8.9–10.3)
Chloride: 92 mmol/L — ABNORMAL LOW (ref 98–111)
Creatinine, Ser: 15.84 mg/dL — ABNORMAL HIGH (ref 0.61–1.24)
GFR, Estimated: 3 mL/min — ABNORMAL LOW (ref >60)
Glucose, Bld: 108 mg/dL — ABNORMAL HIGH (ref 70–99)
Potassium: 5.3 mmol/L — ABNORMAL HIGH (ref 3.5–5.1)
Sodium: 130 mmol/L — ABNORMAL LOW (ref 135–145)
Total Bilirubin: 0.9 mg/dL (ref 0.3–1.2)
Total Protein: 7.5 g/dL (ref 6.5–8.1)

## 2021-04-12 LAB — CBC
HCT: 27 % — ABNORMAL LOW (ref 39.0–52.0)
Hemoglobin: 9.1 g/dL — ABNORMAL LOW (ref 13.0–17.0)
MCH: 32.9 pg (ref 26.0–34.0)
MCHC: 33.7 g/dL (ref 30.0–36.0)
MCV: 97.5 fL (ref 80.0–100.0)
Platelets: 144 10*3/uL — ABNORMAL LOW (ref 150–400)
RBC: 2.77 MIL/uL — ABNORMAL LOW (ref 4.22–5.81)
RDW: 14.5 % (ref 11.5–15.5)
WBC: 5.9 10*3/uL (ref 4.0–10.5)
nRBC: 0 % (ref 0.0–0.2)

## 2021-04-12 LAB — C DIFFICILE QUICK SCREEN W PCR REFLEX
C Diff antigen: NEGATIVE
C Diff interpretation: NOT DETECTED
C Diff toxin: NEGATIVE

## 2021-04-12 LAB — CBG MONITORING, ED: Glucose-Capillary: 107 mg/dL — ABNORMAL HIGH (ref 70–99)

## 2021-04-12 LAB — MAGNESIUM: Magnesium: 2.1 mg/dL (ref 1.7–2.4)

## 2021-04-12 LAB — HIV ANTIBODY (ROUTINE TESTING W REFLEX): HIV Screen 4th Generation wRfx: NONREACTIVE

## 2021-04-12 LAB — GLUCOSE, CAPILLARY
Glucose-Capillary: 108 mg/dL — ABNORMAL HIGH (ref 70–99)
Glucose-Capillary: 95 mg/dL (ref 70–99)
Glucose-Capillary: 81 mg/dL (ref 70–99)

## 2021-04-12 MED ORDER — HEPARIN SODIUM (PORCINE) 1000 UNIT/ML DIALYSIS
1000.0000 [IU] | INTRAMUSCULAR | Status: DC | PRN
  Filled 2021-04-12: qty 1

## 2021-04-12 MED ORDER — LIDOCAINE HCL (PF) 1 % IJ SOLN
5.0000 mL | INTRAMUSCULAR | Status: DC | PRN
  Filled 2021-04-12: qty 5

## 2021-04-12 MED ORDER — ALTEPLASE 2 MG IJ SOLR
2.0000 mg | Freq: Once | INTRAMUSCULAR | Status: DC | PRN
  Filled 2021-04-12: qty 2

## 2021-04-12 MED ORDER — SODIUM CHLORIDE 0.9 % IV SOLN: 100.0000 mL | INTRAVENOUS | Status: DC | PRN

## 2021-04-12 MED ORDER — PENTAFLUOROPROP-TETRAFLUOROETH EX AERO
1.0000 "application " | INHALATION_SPRAY | CUTANEOUS | Status: DC | PRN
  Filled 2021-04-12: qty 30

## 2021-04-12 MED ORDER — ALBUTEROL SULFATE (2.5 MG/3ML) 0.083% IN NEBU: 2.5000 mg | INHALATION_SOLUTION | Freq: Four times a day (QID) | RESPIRATORY_TRACT | Status: DC | PRN

## 2021-04-12 MED ORDER — MORPHINE SULFATE (PF) 2 MG/ML IV SOLN
2.0000 mg | INTRAVENOUS | Status: DC | PRN
  Administered 2021-04-12: 2 mg via INTRAVENOUS
  Filled 2021-04-12: qty 1

## 2021-04-12 MED ORDER — MAGNESIUM OXIDE -MG SUPPLEMENT 400 (240 MG) MG PO TABS
400.0000 mg | ORAL_TABLET | Freq: Every day | ORAL | Status: DC
  Administered 2021-04-12: 400 mg via ORAL
  Filled 2021-04-12: qty 1

## 2021-04-12 MED ORDER — LIDOCAINE-PRILOCAINE 2.5-2.5 % EX CREA
1.0000 "application " | TOPICAL_CREAM | CUTANEOUS | Status: DC | PRN
  Filled 2021-04-12: qty 5

## 2021-04-12 MED ORDER — CHLORHEXIDINE GLUCONATE CLOTH 2 % EX PADS
6.0000 | MEDICATED_PAD | Freq: Every day | CUTANEOUS | Status: DC
  Administered 2021-04-12 – 2021-04-13 (×2): 6 via TOPICAL

## 2021-04-12 MED ORDER — ALUM & MAG HYDROXIDE-SIMETH 200-200-20 MG/5ML PO SUSP: 30.0000 mL | ORAL | Status: DC | PRN

## 2021-04-12 NOTE — Consult Note (Signed)
Patient ID: Jerry [name], male   DOB: 1966-06-30, 55 y.o.   MRN: [id number]  HPI Jerry [name] is a 55 y.o. male seen in consultation at the request of Dr. Tamala Yaw.  Him into the ER complaining of abdominal pain that was present for the last 2 days.  He also reports diarrhea.  The pain is intermittent colicky type.  No specific alleviating or aggravating factors.  He is very hungry this morning denies asking for food. He does have end-stage renal disease on hemodialysis and diabetes. He did have a CT scan that I personally reviewed showing appendicolith but no clear-cut signs of appendicitis.  There is no abscess there is no free air.  There has been negative White count was normal hemoglobin of 9.5 platelet count of 159.  CMP showing severe hyponatremia with a sodium of 128 potassium 5.2 bicarbonate of 18 and BUN of 106 and creatinine of 14.    HPI  Past Medical History:  Diagnosis Date   Diabetes mellitus ([place])    ESRD on hemodialysis ([place])    Hypertension    Renal disorder     Past Surgical History:  Procedure Laterality Date   AV FISTULA PLACEMENT Left    FOOT SURGERY      Family History  Problem Relation Age of Onset   Kidney failure Mother    Glaucoma Mother    Kidney failure Father     Social History Social History   Tobacco Use   Smoking status: Never   Smokeless tobacco: Never  Vaping Use   Vaping Use: Never used  Substance Use Topics   Alcohol use: Yes    Comment: occasional   Drug use: Never    No Known Allergies  Current Facility-Administered Medications  Medication Dose Route Frequency Provider Last Rate Last Admin   acetaminophen (TYLENOL) tablet 650 mg  650 mg Oral Q6H PRN Para Skeans, MD       Or   acetaminophen (TYLENOL) suppository 650 mg  650 mg Rectal Q6H PRN Para Skeans, MD       albuterol (PROVENTIL) (2.5 MG/3ML) 0.083% nebulizer solution 2.5 mg  2.5 mg Nebulization Q6H Para Skeans, MD       alum & mag hydroxide-simeth  (MAALOX/MYLANTA) 200-200-20 MG/5ML suspension 30 mL  30 mL Oral Q4H PRN Para Skeans, MD       heparin injection 5,000 Units  5,000 Units Subcutaneous Q8H Para Skeans, MD   5,000 Units at 04/12/21 0648   hydrALAZINE (APRESOLINE) injection 10 mg  10 mg Intravenous Q6H PRN Para Skeans, MD   10 mg at 04/11/21 2202   hydrALAZINE (APRESOLINE) tablet 10 mg  10 mg Oral Q8H Patel, Esmeralda Links V, MD       insulin aspart (novoLOG) injection 0-6 Units  0-6 Units Subcutaneous TID WC Florina Ou V, MD       morphine 2 MG/ML injection 2 mg  2 mg Intravenous Q4H PRN Dwyane Dee, MD   2 mg at 04/12/21 0345   Current Outpatient Medications  Medication Sig Dispense Refill   calcium carbonate (TUMS EX) 750 MG chewable tablet Chew by mouth 3 (three) times daily. With meals     gabapentin (NEURONTIN) 300 MG capsule Take 300 mg by mouth 2 (two) times daily.     RENVELA 800 MG tablet Take 1,600 mg by mouth 2 (two) times daily. After meals     traMADol (ULTRAM) 50 MG tablet Take 100 mg by mouth  every 12 (twelve) hours as needed. In the morning and evening.       Review of Systems Full ROS  was asked and was negative except for the information on the HPI  Physical Exam Blood pressure (!) 160/84, pulse 73, temperature 98.4 F (36.9 C), temperature source Oral, resp. rate 14, SpO2 98 %. CONSTITUTIONAL: NAD EYES: Pupils are equal, round, , Sclera are non-icteric. EARS, NOSE, MOUTH AND THROAT: He is wearing a mask.  Hearing is intact to voice. LYMPH NODES:  Lymph nodes in the neck are normal. RESPIRATORY:  Lungs are clear. There is normal respiratory effort, with equal breath sounds bilaterally, and without pathologic use of accessory muscles. CARDIOVASCULAR: Heart is regular without murmurs, gallops, or rubs. GI: The abdomen is soft, diffuse tenderness to palpation seems to be more tender on the left side than on the right.  No peritonitis there are normal bowel sounds in all quadrants. GU: Rectal deferred.    MUSCULOSKELETAL: Normal muscle strength and tone. No cyanosis or edema.  Functional left upper arm fistula with thrill SKIN: Turgor is good and there are no pathologic skin lesions or ulcers. NEUROLOGIC: Motor and sensation is grossly normal. Cranial nerves are grossly intact. PSYCH:  Oriented to person, place and time. Affect is normal.  Data Reviewed  I have personally reviewed the patient's imaging, laboratory findings and medical records.    Assessment/  Plan  55 year old male with end-stage renal disease on hemodialysis presents with abdominal pain and diarrhea likely colitis or enteritis.  Low yield for appendicitis.  He is hungry and I will start him on a clear liquid diet.  We will continue to follow him and do serial abdominal exams.  I am very doubtful about appendicitis in this case.  We will continue to follow him.  He definitely needs to be optimized from a medical perspective and likely will need dialysis.  No need for emergent intervention at this time. Extensive counseling provided to the patient and to his wife    Caroleen Hamman, MD [facility] General Surgeon 04/12/2021, 8:11 AM

## 2021-04-12 NOTE — Progress Notes (Signed)
Avf accessed with 15 gauge needles, positive bruit and thrill noted.

## 2021-04-12 NOTE — Progress Notes (Signed)
Dr. Candiss Norse notified of prolonged qtc interval 522, order for magnesium placed. Pt resting, no complaints.

## 2021-04-12 NOTE — ED Notes (Signed)
Pt visualized resting at this time, RR even and unlabored, stretcher locked in lowest position, call bell in reach

## 2021-04-12 NOTE — ED Notes (Signed)
Attempted to call report, was told by secretary that nurse said to send pt and had no questions.

## 2021-04-12 NOTE — Progress Notes (Addendum)
PROGRESS NOTE    Jerry [name]  [id number] DOB: [birth year] DOA: 04/11/2021 PCP: [facility]   Chief Complaint  Patient presents with   Abdominal Pain    Brief Narrative:  55 yo m with ESRD who presented with abdominal pain with imaging findings concerning for early appendicitis.  Surgery has been consulted and is following.     Assessment & Plan:   Principal Problem:   Abdominal pain Active Problems:   Hypertension   Diabetes mellitus ([place])   ESRD on hemodialysis (HCC)   Hyponatremia  Abdominal Pain CT with distended appendix measuring 7 mm with small appendicoliths and adjacent periappendiceal inflammatory change -? Early appendicitis Surgery c/s - low suspicion for appendicitis, appreciate assistance Negative C diff, GI path panel Follow serial exams, consider repeat imaging Clear liquid diet  ESRD Renal consult for dialysis  Hypertension Follow with dialysis Follow on hydral  Type 2 Diabetes SSI  Follow A1c  Hyponatremia Possibly due to volume overload  DVT prophylaxis: heparin  Code Status: full  Family Communication: noen at bedside Disposition:   Status is: Inpatient  Remains inpatient appropriate because:Inpatient level of care appropriate due to severity of illness  Dispo: The patient is from: Home              Anticipated d/c is to: Home              Patient currently is not medically stable to d/c.   Difficult to place patient No       Consultants:  Renal surgery  Procedures:  none  Antimicrobials:  Anti-infectives (From admission, onward)    None          Subjective: No new complaints Feels Jerry [name] little better  Objective: Vitals:   04/12/21 1345 04/12/21 1353 04/12/21 1426 04/12/21 1539  BP: (!) 195/75 (!) 195/75  (!) 172/69  Pulse:    77  Resp: '16 14 12 18  '$ Temp:    97.9 F (36.6 C)  TempSrc:      SpO2:    99%    Intake/Output Summary (Last 24 hours) at 04/12/2021 1619 Last  data filed at 04/12/2021 1353 Gross per 24 hour  Intake --  Output 500 ml  Net -500 ml   There were no vitals filed for this visit.  Examination:  General exam: Appears calm and comfortable  Respiratory system: Clear to auscultation. Respiratory effort normal. Cardiovascular system: S1 & S2 heard, RRR Gastrointestinal system: mild LLQ Central nervous system: Alert and oriented. No focal neurological deficits. Extremities: no LEE Skin: No rashes, lesions or ulcers Psychiatry: Judgement and insight appear normal. Mood & affect appropriate.     Data Reviewed: I have personally reviewed following labs and imaging studies  CBC: Recent Labs  Lab 04/11/21 1614 04/12/21 0700  WBC 6.5 5.9  HGB 9.5* 9.1*  HCT 28.4* 27.0*  MCV 99.0 97.5  PLT 159 144*    Basic Metabolic Panel: Recent Labs  Lab 04/11/21 1614 04/12/21 0700 04/12/21 1151  NA 128* 130*  --   K 5.2* 5.3*  --   CL 93* 92*  --   CO2 18* 15*  --   GLUCOSE 120* 108*  --   BUN 106* 123*  --   CREATININE 14.08* 15.84*  --   CALCIUM 9.7 9.1  --   MG  --   --  2.1    GFR: CrCl cannot be calculated (Unknown ideal weight.).  Liver Function Tests: Recent Labs  Lab 04/11/21 1614 04/12/21 0700  AST 13* 7*  ALT 16 13  ALKPHOS 65 77  BILITOT 1.0 0.9  PROT 7.9 7.5  ALBUMIN 3.9 3.7    CBG: Recent Labs  Lab 04/11/21 2342 04/12/21 0734 04/12/21 1014 04/12/21 1304 04/12/21 1548  GLUCAP 139* 107* 108* 95 81     Recent Results (from the past 240 hour(s))  Resp Panel by RT-PCR (Flu Dynasty Holquin&B, Covid) Nasopharyngeal Swab     Status: None   Collection Time: 04/11/21  6:03 PM   Specimen: Nasopharyngeal Swab; Nasopharyngeal(NP) swabs in vial transport medium  Result Value Ref Range Status   SARS Coronavirus 2 by RT PCR NEGATIVE NEGATIVE Final    Comment: (NOTE) SARS-CoV-2 target nucleic acids are NOT DETECTED.  The SARS-CoV-2 RNA is generally detectable in upper respiratory specimens during the acute phase of  infection. The lowest concentration of SARS-CoV-2 viral copies this assay can detect is 138 copies/mL. Niel Peretti negative result does not preclude SARS-Cov-2 infection and should not be used as the sole basis for treatment or other patient management decisions. Kaevon Cotta negative result may occur with  improper specimen collection/handling, submission of specimen other than nasopharyngeal swab, presence of viral mutation(s) within the areas targeted by this assay, and inadequate number of viral copies(<138 copies/mL). Bobie Caris negative result must be combined with clinical observations, patient history, and epidemiological information. The expected result is Negative.  Fact Sheet for Patients:  EntrepreneurPulse.com.au  Fact Sheet for Healthcare Providers:  IncredibleEmployment.be  This test is no t yet approved or cleared by the [place] FDA and  has been authorized for detection and/or diagnosis of SARS-CoV-2 by FDA under an Emergency Use Authorization (EUA). This EUA will remain  in effect (meaning this test can be used) for the duration of the COVID-19 declaration under Section 564(b)(1) of the Act, 21 U.S.C.section 360bbb-3(b)(1), unless the authorization is terminated  or revoked sooner.       Influenza Aeryn Medici by PCR NEGATIVE NEGATIVE Final   Influenza B by PCR NEGATIVE NEGATIVE Final    Comment: (NOTE) The Xpert Xpress SARS-CoV-2/FLU/RSV plus assay is intended as an aid in the diagnosis of influenza from Nasopharyngeal swab specimens and should not be used as Jeymi Hepp sole basis for treatment. Nasal washings and aspirates are unacceptable for Xpert Xpress SARS-CoV-2/FLU/RSV testing.  Fact Sheet for Patients: EntrepreneurPulse.com.au  Fact Sheet for Healthcare Providers: IncredibleEmployment.be  This test is not yet approved or cleared by the [place] FDA and has been authorized for detection and/or diagnosis of SARS-CoV-2  by FDA under an Emergency Use Authorization (EUA). This EUA will remain in effect (meaning this test can be used) for the duration of the COVID-19 declaration under Section 564(b)(1) of the Act, 21 U.S.C. section 360bbb-3(b)(1), unless the authorization is terminated or revoked.  Performed at [facility], [place], [place] [address]   C Difficile Quick Screen w PCR reflex     Status: None   Collection Time: 04/12/21  2:16 AM   Specimen: STOOL  Result Value Ref Range Status   C Diff antigen NEGATIVE NEGATIVE Final   C Diff toxin NEGATIVE NEGATIVE Final   C Diff interpretation No C. difficile detected.  Final    Comment: Performed at [facility], [address], [place],  [address]         Radiology Studies: CT ABDOMEN PELVIS WO CONTRAST  Result Date: 04/11/2021 CLINICAL DATA:  Abdominal pain, acute, nonlocalized EXAM: CT ABDOMEN AND PELVIS WITHOUT CONTRAST TECHNIQUE:  Multidetector CT imaging of the abdomen and pelvis was performed following the standard protocol without IV contrast. COMPARISON:  CT 12/30/2013 FINDINGS: Lower chest: Mild interlobular septal thickening and ground-glass opacities in the lung bases, likely edema. Enlarged heart with mitral annular, coronary artery and aortic valve calcifications. Hepatobiliary: Mild periportal edema, likely from volume overload. No gallstones, gallbladder wall thickening, or biliary dilatation. Pancreas: Unremarkable. No pancreatic ductal dilatation or surrounding inflammatory changes. Spleen: Normal in size without focal abnormality. Adrenals/Urinary Tract: Adrenal glands are unremarkable. Atrophic kidneys with multiple bilateral renal cysts, consistent with acquired cystic renal disease of dialysis, this is new since 2015. Bladder is nondistended. Stomach/Bowel: Small hiatal hernia. The stomach is unremarkable. There is no evidence of bowel obstruction. There are two appendicoliths, one near the  base of the appendix measuring 3 mm (axial image 68) and another distally measuring 3 mm (axial image 72). The appendix is distended measuring 7 mm, with some periappendiceal inflammatory change. There is no adjacent abscess. There is Martez Weiand punctate foci of gas within the appendix distally. Vascular/Lymphatic: Aortoiliac atherosclerotic calcifications. There is no lymphadenopathy. Reproductive: Mildly enlarged prostate. Other: Small fat containing right inguinal hernia. Musculoskeletal: No acute or significant osseous findings. IMPRESSION: Distended appendix measuring 7 mm with small appendicoliths and adjacent periappendiceal inflammatory change. No adjacent abscess. Findings could reflect early appendicitis, although Teonia Yager punctate foci of gas is seen within the distal appendix. 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 Cardiomegaly with pulmonary edema noted in the lung bases. Appendix: Location: Anterior pelvis Diameter: 7 mm Appendicolith: Present, two measuring 74m each Mucosal hyper-enhancement: Minimal Extraluminal gas: None Periappendiceal collection: None Electronically Signed   By: JMaurine Simmering  On: 04/11/2021 18:06   DG Chest Portable 1 View  Result Date: 04/11/2021 CLINICAL DATA:  55year old male with chest pain and cough. EXAM: PORTABLE CHEST 1 VIEW COMPARISON:  Chest radiograph dated 01/19/2015 FINDINGS: There is cardiomegaly with vascular congestion. No focal consolidation, pleural effusion, pneumothorax. Atherosclerotic calcification of the aortic arch. No acute osseous pathology. IMPRESSION: Cardiomegaly with vascular congestion. No focal consolidation. Electronically Signed   By: AAnner CreteM.D.   On: 04/11/2021 17:41        Scheduled Meds:  albuterol  2.5 mg Nebulization Q6H   Chlorhexidine Gluconate Cloth  6 each Topical Q0600   heparin  5,000 Units Subcutaneous Q8H   hydrALAZINE  10 mg Oral Q8H   insulin aspart  0-6  Units Subcutaneous TID WC   magnesium oxide  400 mg Oral QHS   Continuous Infusions:   LOS: 1 day    Time spent: over 30 min     CFayrene Helper MD [facility]   To contact the attending provider between 7A-7P or the covering provider during after hours 7P-7A, please log into the web site [email or web address] and access using universal [place] password for that web site. If you do not have the password, please call the hospital operator.  04/12/2021, 4:19 PM

## 2021-04-12 NOTE — Plan of Care (Signed)

## 2021-04-12 NOTE — Progress Notes (Signed)
[facility]  ROUNDING NOTE   Subjective:   Jerry [name] is a 55 y.o. male with past medical conditions including diabetes, hypertension and ESRD on HD. He presents to the Ed with complaints of diarrhea that began on Monday.  Patient currently receives dialysis at [facility]. He reports missing his treatment on Monday due to not feeling well. We have been consulted to manage dialysis while inpatient. Patient is spanish speaking and the video interpretor was used during this encounter. He states the diarrhea began on Monday. He says he gets an upset stomach with dialysis and has to go to the afternoon often during and after dialysis. This is different because it seems began before dialysis. He is having several episodes a day, most recent being this morning. Denies dark or bloody stools. Denies changes in diet or appetite. Denies NSAID use. States he had a cough but it has improved. Endorses abdominal pain with diarrhea.    Objective:  Vital signs in last 24 hours:  Temp:  [98.2 F (36.8 C)-98.8 F (37.1 C)] 98.3 F (36.8 C) (06/29 1053) Pulse Rate:  [69-82] 69 (06/29 1115) Resp:  [11-23] 16 (06/29 1345) BP: (147-217)/(53-93) 195/75 (06/29 1345) SpO2:  [94 %-100 %] 100 % (06/29 1053)  Weight change:  There were no vitals filed for this visit.  Intake/Output: No intake/output data recorded.   Intake/Output this shift:  No intake/output data recorded.  Physical Exam: General: NAD,   Head: Normocephalic, atraumatic. Moist oral mucosal membranes  Eyes: Anicteric  Lungs:  Clear to auscultation, normal effort  Heart: Regular rate and rhythm  Abdomen:  Soft, tender  Extremities:  trace peripheral edema.  Neurologic: Nonfocal, moving all four extremities  Skin: No lesions  Access: Lt AVF    Basic Metabolic Panel: Recent Labs  Lab 04/11/21 1614 04/12/21 0700 04/12/21 1151  NA 128* 130*  --   K 5.2* 5.3*  --   CL 93* 92*  --   CO2 18* 15*  --    GLUCOSE 120* 108*  --   BUN 106* 123*  --   CREATININE 14.08* 15.84*  --   CALCIUM 9.7 9.1  --   MG  --   --  2.1    Liver Function Tests: Recent Labs  Lab 04/11/21 1614 04/12/21 0700  AST 13* 7*  ALT 16 13  ALKPHOS 65 77  BILITOT 1.0 0.9  PROT 7.9 7.5  ALBUMIN 3.9 3.7   Recent Labs  Lab 04/11/21 1614  LIPASE 26   No results for input(s): AMMONIA in the last 168 hours.  CBC: Recent Labs  Lab 04/11/21 1614 04/12/21 0700  WBC 6.5 5.9  HGB 9.5* 9.1*  HCT 28.4* 27.0*  MCV 99.0 97.5  PLT 159 144*    Cardiac Enzymes: No results for input(s): CKTOTAL, CKMB, CKMBINDEX, TROPONINI in the last 168 hours.  BNP: Invalid input(s): POCBNP  CBG: Recent Labs  Lab 04/11/21 2342 04/12/21 0734 04/12/21 1014 04/12/21 1304  GLUCAP 139* 107* 108* 95    Microbiology: Results for orders placed or performed during the hospital encounter of 04/11/21  Resp Panel by RT-PCR (Flu A&B, Covid) Nasopharyngeal Swab     Status: None   Collection Time: 04/11/21  6:03 PM   Specimen: Nasopharyngeal Swab; Nasopharyngeal(NP) swabs in vial transport medium  Result Value Ref Range Status   SARS Coronavirus 2 by RT PCR NEGATIVE NEGATIVE Final    Comment: (NOTE) SARS-CoV-2 target nucleic acids are NOT DETECTED.  The SARS-CoV-2  RNA is generally detectable in upper respiratory specimens during the acute phase of infection. The lowest concentration of SARS-CoV-2 viral copies this assay can detect is 138 copies/mL. A negative result does not preclude SARS-Cov-2 infection and should not be used as the sole basis for treatment or other patient management decisions. A negative result may occur with  improper specimen collection/handling, submission of specimen other than nasopharyngeal swab, presence of viral mutation(s) within the areas targeted by this assay, and inadequate number of viral copies(<138 copies/mL). A negative result must be combined with clinical observations, patient  history, and epidemiological information. The expected result is Negative.  Fact Sheet for Patients:  EntrepreneurPulse.com.au  Fact Sheet for Healthcare Providers:  IncredibleEmployment.be  This test is no t yet approved or cleared by the [place] FDA and  has been authorized for detection and/or diagnosis of SARS-CoV-2 by FDA under an Emergency Use Authorization (EUA). This EUA will remain  in effect (meaning this test can be used) for the duration of the COVID-19 declaration under Section 564(b)(1) of the Act, 21 U.S.C.section 360bbb-3(b)(1), unless the authorization is terminated  or revoked sooner.       Influenza A by PCR NEGATIVE NEGATIVE Final   Influenza B by PCR NEGATIVE NEGATIVE Final    Comment: (NOTE) The Xpert Xpress SARS-CoV-2/FLU/RSV plus assay is intended as an aid in the diagnosis of influenza from Nasopharyngeal swab specimens and should not be used as a sole basis for treatment. Nasal washings and aspirates are unacceptable for Xpert Xpress SARS-CoV-2/FLU/RSV testing.  Fact Sheet for Patients: EntrepreneurPulse.com.au  Fact Sheet for Healthcare Providers: IncredibleEmployment.be  This test is not yet approved or cleared by the [place] FDA and has been authorized for detection and/or diagnosis of SARS-CoV-2 by FDA under an Emergency Use Authorization (EUA). This EUA will remain in effect (meaning this test can be used) for the duration of the COVID-19 declaration under Section 564(b)(1) of the Act, 21 U.S.C. section 360bbb-3(b)(1), unless the authorization is terminated or revoked.  Performed at [facility], [place], [place] [address]   C Difficile Quick Screen w PCR reflex     Status: None   Collection Time: 04/12/21  2:16 AM   Specimen: STOOL  Result Value Ref Range Status   C Diff antigen NEGATIVE NEGATIVE Final   C Diff toxin NEGATIVE  NEGATIVE Final   C Diff interpretation No C. difficile detected.  Final    Comment: Performed at [facility], [address] [place], [place] [address]    Coagulation Studies: No results for input(s): LABPROT, INR in the last 72 hours.  Urinalysis: No results for input(s): COLORURINE, LABSPEC, PHURINE, GLUCOSEU, HGBUR, BILIRUBINUR, KETONESUR, PROTEINUR, UROBILINOGEN, NITRITE, LEUKOCYTESUR in the last 72 hours.  Invalid input(s): APPERANCEUR    Imaging: CT ABDOMEN PELVIS WO CONTRAST  Result Date: 04/11/2021 CLINICAL DATA:  Abdominal pain, acute, nonlocalized EXAM: CT ABDOMEN AND PELVIS WITHOUT CONTRAST TECHNIQUE: Multidetector CT imaging of the abdomen and pelvis was performed following the standard protocol without IV contrast. COMPARISON:  CT 12/30/2013 FINDINGS: Lower chest: Mild interlobular septal thickening and ground-glass opacities in the lung bases, likely edema. Enlarged heart with mitral annular, coronary artery and aortic valve calcifications. Hepatobiliary: Mild periportal edema, likely from volume overload. No gallstones, gallbladder wall thickening, or biliary dilatation. Pancreas: Unremarkable. No pancreatic ductal dilatation or surrounding inflammatory changes. Spleen: Normal in size without focal abnormality. Adrenals/Urinary Tract: Adrenal glands are unremarkable. Atrophic kidneys with multiple bilateral renal cysts, consistent with  acquired cystic renal disease of dialysis, this is new since 2015. Bladder is nondistended. Stomach/Bowel: Small hiatal hernia. The stomach is unremarkable. There is no evidence of bowel obstruction. There are two appendicoliths, one near the base of the appendix measuring 3 mm (axial image 68) and another distally measuring 3 mm (axial image 72). The appendix is distended measuring 7 mm, with some periappendiceal inflammatory change. There is no adjacent abscess. There is a punctate foci of gas within the appendix distally.  Vascular/Lymphatic: Aortoiliac atherosclerotic calcifications. There is no lymphadenopathy. Reproductive: Mildly enlarged prostate. Other: Small fat containing right inguinal hernia. Musculoskeletal: No acute or significant osseous findings. IMPRESSION: Distended appendix measuring 7 mm with small appendicoliths and adjacent periappendiceal inflammatory change. No adjacent abscess. Findings could reflect early appendicitis, although a punctate foci of gas is seen within the distal appendix. Atrophic kidneys with multiple bilateral cysts consistent with a acquired cystic renal disease of dialysis. Cardiomegaly with pulmonary edema noted in the lung bases. Appendix: Location: Anterior pelvis Diameter: 7 mm Appendicolith: Present, two measuring 79m each Mucosal hyper-enhancement: Minimal Extraluminal gas: None Periappendiceal collection: None Electronically Signed   By: JMaurine Simmering  On: 04/11/2021 18:06   DG Chest Portable 1 View  Result Date: 04/11/2021 CLINICAL DATA:  55year old male with chest pain and cough. EXAM: PORTABLE CHEST 1 VIEW COMPARISON:  Chest radiograph dated 01/19/2015 FINDINGS: There is cardiomegaly with vascular congestion. No focal consolidation, pleural effusion, pneumothorax. Atherosclerotic calcification of the aortic arch. No acute osseous pathology. IMPRESSION: Cardiomegaly with vascular congestion. No focal consolidation. Electronically Signed   By: AAnner CreteM.D.   On: 04/11/2021 17:41     Medications:    sodium chloride     sodium chloride      albuterol  2.5 mg Nebulization Q6H   Chlorhexidine Gluconate Cloth  6 each Topical Q0600   heparin  5,000 Units Subcutaneous Q8H   hydrALAZINE  10 mg Oral Q8H   insulin aspart  0-6 Units Subcutaneous TID WC   magnesium oxide  400 mg Oral QHS   sodium chloride, sodium chloride, acetaminophen **OR** acetaminophen, alteplase, alum & mag hydroxide-simeth, heparin, hydrALAZINE, lidocaine (PF), lidocaine-prilocaine, morphine  injection, pentafluoroprop-tetrafluoroeth  Assessment/ Plan:  Jerry [name] a 55y.o.  male ith past medical conditions including diabetes, hypertension and ESRD on HD. He presents to the Ed with complaints of diarrhea that began on Monday.  [facility]/MWF/Lt AVF/81.5 kg  End stage renal disease on dialysis Last hemodialysis 04/07/21 Scheduled to receive dialysis today Uf goal 0-0.5L as tolerated Next treatment scheduled for Friday  2. Anemia of chronic kidney disease  Lab Results  Component Value Date   HGB 9.1 (L) 04/12/2021  Hgb below goal Will monitor  3. Diabetes mellitus type II with chronic kidney disease Noninsulin dependent.  No recent Hgb A1c, primary team to order Stable  4. Secondary Hyperparathyroidism: with outpatient labs: phosphorus 5.4, calcium 9.2 on 03/27/21.   Lab Results  Component Value Date   CALCIUM 9.1 04/12/2021   PHOS 4.4 01/04/2014    Renvela with meals Will order phosphorus with morning labs Will monitor  5. Hyponatremia Currently 130 This will correct with dialysis   LOS: 1 [place]6/29/20222:14 PM

## 2021-04-13 DIAGNOSIS — R1084 Generalized abdominal pain: Secondary | ICD-10-CM

## 2021-04-13 LAB — GASTROINTESTINAL PANEL BY PCR, STOOL (REPLACES STOOL CULTURE)

## 2021-04-13 LAB — COMPREHENSIVE METABOLIC PANEL
ALT: 11 U/L (ref 0–44)
AST: 11 U/L — ABNORMAL LOW (ref 15–41)
Albumin: 3.7 g/dL (ref 3.5–5.0)
Alkaline Phosphatase: 60 U/L (ref 38–126)
Anion gap: 13 (ref 5–15)
BUN: 53 mg/dL — ABNORMAL HIGH (ref 6–20)
CO2: 27 mmol/L (ref 22–32)
Calcium: 8.6 mg/dL — ABNORMAL LOW (ref 8.9–10.3)
Chloride: 96 mmol/L — ABNORMAL LOW (ref 98–111)
Creatinine, Ser: 9.79 mg/dL — ABNORMAL HIGH (ref 0.61–1.24)
GFR, Estimated: 6 mL/min — ABNORMAL LOW (ref >60)
Glucose, Bld: 87 mg/dL (ref 70–99)
Potassium: 4.1 mmol/L (ref 3.5–5.1)
Sodium: 136 mmol/L (ref 135–145)
Total Bilirubin: 0.9 mg/dL (ref 0.3–1.2)
Total Protein: 7.8 g/dL (ref 6.5–8.1)

## 2021-04-13 LAB — CBC
HCT: 25.9 % — ABNORMAL LOW (ref 39.0–52.0)
Hemoglobin: 9 g/dL — ABNORMAL LOW (ref 13.0–17.0)
MCH: 34.4 pg — ABNORMAL HIGH (ref 26.0–34.0)
MCHC: 34.7 g/dL (ref 30.0–36.0)
MCV: 98.9 fL (ref 80.0–100.0)
Platelets: 145 10*3/uL — ABNORMAL LOW (ref 150–400)
RBC: 2.62 MIL/uL — ABNORMAL LOW (ref 4.22–5.81)
RDW: 14.6 % (ref 11.5–15.5)
WBC: 4.2 10*3/uL (ref 4.0–10.5)
nRBC: 0 % (ref 0.0–0.2)

## 2021-04-13 LAB — GLUCOSE, CAPILLARY
Glucose-Capillary: 84 mg/dL (ref 70–99)
Glucose-Capillary: 93 mg/dL (ref 70–99)
Glucose-Capillary: 132 mg/dL — ABNORMAL HIGH (ref 70–99)

## 2021-04-13 LAB — HEMOGLOBIN A1C
Hgb A1c MFr Bld: 6 % — ABNORMAL HIGH (ref 4.8–5.6)
Hgb A1c MFr Bld: 5.7 % — ABNORMAL HIGH (ref 4.8–5.6)
Mean Plasma Glucose: 126 mg/dL
Mean Plasma Glucose: 117 mg/dL

## 2021-04-13 LAB — PHOSPHORUS: Phosphorus: 3.6 mg/dL (ref 2.5–4.6)

## 2021-04-13 LAB — MAGNESIUM: Magnesium: 2 mg/dL (ref 1.7–2.4)

## 2021-04-13 MED ORDER — POLYETHYLENE GLYCOL 3350 17 G PO PACK: 17.0000 g | PACK | Freq: Every day | ORAL | 0 refills | Status: DC

## 2021-04-13 MED ORDER — HYDRALAZINE HCL 25 MG PO TABS: 25.0000 mg | ORAL_TABLET | Freq: Three times a day (TID) | ORAL | 1 refills | Status: DC

## 2021-04-13 MED ORDER — POLYETHYLENE GLYCOL 3350 17 G PO PACK: 17.0000 g | PACK | Freq: Every day | ORAL | Status: DC

## 2021-04-13 MED ORDER — GABAPENTIN 300 MG PO CAPS: 300.0000 mg | ORAL_CAPSULE | ORAL | 0 refills | Status: DC

## 2021-04-13 NOTE — Discharge Summary (Signed)
Physician Discharge Summary  Jerry [name] DOB: 08-02-1966 DOA: 04/11/2021  PCP: [facility] [place] date: 04/11/2021 Discharge date: 04/13/2021  Time spent: 40 minutes  Recommendations for Outpatient Follow-up:  Follow outpatient CBC/CMP Follow with general surgery outpatient.  Consider GI follow up.  Follow blood pressure outpatient, started on hydral, expect he'll need further adjustment outpatient  Gabapentin decreased to after dialysis dosing   Follow renal cysts outpatient    Discharge Diagnoses:  Principal Problem:   Abdominal pain Active Problems:   Hypertension   Diabetes mellitus ([place])   ESRD on hemodialysis ([place])   Hyponatremia   Discharge Condition: stable  Diet recommendation: renal diet  There were no vitals filed for this visit.  History of present illness:  55 yo m with ESRD who presented with abdominal pain with imaging findings concerning for early appendicitis.  Surgery has been consulted and is following.  He improved quickly with conservative management and his symptoms were thought 2/2 colitis and uremia.  Surgery planning for outpatient follow up to make decision regarding intervention.    See below for additional details  Hospital Course:  Abdominal Pain CT with distended appendix measuring 7 mm with small appendicoliths and adjacent periappendiceal inflammatory change -? Early appendicitis Surgery c/s - low suspicion for appendicitis, appreciate assistance - ? Related to colitis and uremia - planning to follow outpatient to make decision on need for surgical intervention  Symptoms resolved at this time, colitis/GI bug? 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 Cardiovascular: Heart sounds show Jerry [name] regular rate, and rhythm. Lungs: Clear to auscultation bilaterally Abdomen: Soft, nontender, nondistended Neurological: Alert and oriented 3. Moves all extremities 4 . Cranial nerves II through XII grossly intact. Skin: Warm and dry. No rashes or lesions. Extremities: No clubbing or cyanosis. No edema.  Discharge Instructions   Discharge Instructions     Call MD for:  difficulty breathing, headache or visual disturbances   Complete by: As directed    Call MD for:  extreme fatigue   Complete by: As directed    Call MD for:  hives   Complete by: As directed    Call MD for:  persistant dizziness or light-headedness   Complete by: As directed    Call MD for:  persistant nausea and vomiting   Complete by: As directed    Call MD for:  redness, tenderness, or signs of infection (pain, swelling, redness, odor or green/yellow discharge around incision site)   Complete by: As directed    Call MD for:  severe uncontrolled pain   Complete by: As directed    Call MD for:  temperature >100.4   Complete by: As directed    Diet - low sodium heart healthy   Complete by: As  directed    Discharge instructions   Complete by: As directed    You were seen for abdominal discomfort.  We were initially concerned about appendicitis, but this seems less likely at this time.  Your symptoms may have been related to colitis or Jerry [name] "stomach bug".  Please follow up with general surgery in Jerry [name] few weeks.  They'll review  whether considering surgery is recommended.  Consider gastroenterology follow up after discharge as well.   Please follow up with your PCP for blood pressure management.  Your blood pressure was high here.  We've started you on hydralazine, this will need further adjustment after discharge.    We've made some adjustments to your chronic home meds.  Take your gabapentin after dialysis on Mondays, Wednesday, and Fridays.   Continue your calcium carbonate and renvela.    Return for new, recurrent, or worsening symptoms.   Please ask your PCP to request records from this hospitalization so they know what was done and what the next steps will be.   Increase activity slowly   Complete by: As directed       Allergies as of 04/13/2021   No Known Allergies      Medication List     TAKE these medications    calcium carbonate 750 MG chewable tablet Commonly known as: TUMS EX Chew by mouth 3 (three) times daily. With meals   gabapentin 300 MG capsule Commonly known as: NEURONTIN Take 1 capsule (300 mg total) by mouth every Monday, Wednesday, and Friday at 6 PM. After dialysis Start taking on: April 14, 2021 What changed:  when to take this additional instructions   hydrALAZINE 25 MG tablet Commonly known as: APRESOLINE Take 1 tablet (25 mg total) by mouth 3 (three) times daily.   Renvela 800 MG tablet Generic drug: sevelamer carbonate Take 1,600 mg by mouth 2 (two) times daily. After meals   traMADol 50 MG tablet Commonly known as: ULTRAM Take 100 mg by mouth every 12 (twelve) hours as needed. In the morning and evening.       No Known Allergies  Follow-up Information     P[place]on, [place] F, MD Follow up in 3 week(s).   Specialty: General Surgery Contact information: [address] [place] [address] [phone]                  The results of significant diagnostics from this hospitalization (including imaging, microbiology, ancillary and  laboratory) are listed below for reference.    Significant Diagnostic Studies: CT ABDOMEN PELVIS WO CONTRAST  Result Date: 04/11/2021 CLINICAL DATA:  Abdominal pain, acute, nonlocalized EXAM: CT ABDOMEN AND PELVIS WITHOUT CONTRAST TECHNIQUE: Multidetector CT imaging of the abdomen and pelvis was performed following the standard protocol without IV contrast. COMPARISON:  CT 12/30/2013 FINDINGS: Lower chest: Mild interlobular septal thickening and ground-glass opacities in the lung bases, likely edema. Enlarged heart with mitral annular, coronary artery and aortic valve calcifications. Hepatobiliary: Mild periportal edema, likely from volume overload. No gallstones, gallbladder wall thickening, or biliary dilatation. Pancreas: Unremarkable. No pancreatic ductal dilatation or surrounding inflammatory changes. Spleen: Normal in size without focal abnormality. Adrenals/Urinary Tract: Adrenal glands are unremarkable. Atrophic kidneys with multiple bilateral renal cysts, consistent with acquired cystic renal disease of dialysis, this is new since 2015. Bladder is nondistended. Stomach/Bowel: Small hiatal hernia. The stomach is unremarkable. There is no evidence of bowel obstruction. There are two appendicoliths, one near the base of the appendix measuring 3 mm (axial image  68) and another distally measuring 3 mm (axial image 72). The appendix is distended measuring 7 mm, with some periappendiceal inflammatory change. There is no adjacent abscess. There is [place] Steidle punctate foci of gas within the appendix distally. Vascular/Lymphatic: Aortoiliac atherosclerotic calcifications. There is no lymphadenopathy. Reproductive: Mildly enlarged prostate. Other: Small fat containing right inguinal hernia. Musculoskeletal: No acute or significant osseous findings. IMPRESSION: Distended appendix measuring 7 mm with small appendicoliths and adjacent periappendiceal inflammatory change. No adjacent abscess. Findings could reflect early  appendicitis, although Johnthan Axtman punctate foci of gas is seen within the distal appendix. 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 Cardiomegaly with pulmonary edema noted in the lung bases. Appendix: Location: Anterior pelvis Diameter: 7 mm Appendicolith: Present, two measuring 70m each Mucosal hyper-enhancement: Minimal Extraluminal gas: None Periappendiceal collection: None Electronically Signed   By: JMaurine Simmering  On: 04/11/2021 18:06   DG Chest Portable 1 View  Result Date: 04/11/2021 CLINICAL DATA:  55year old male with chest pain and cough. EXAM: PORTABLE CHEST 1 VIEW COMPARISON:  Chest radiograph dated 01/19/2015 FINDINGS: There is cardiomegaly with vascular congestion. No focal consolidation, pleural effusion, pneumothorax. Atherosclerotic calcification of the aortic arch. No acute osseous pathology. IMPRESSION: Cardiomegaly with vascular congestion. No focal consolidation. Electronically Signed   By: AAnner CreteM.D.   On: 04/11/2021 17:41    Microbiology: Recent Results (from the past 240 hour(s))  Resp Panel by RT-PCR (Flu Lynna Zamorano&B, Covid) Nasopharyngeal Swab     Status: None   Collection Time: 04/11/21  6:03 PM   Specimen: Nasopharyngeal Swab; Nasopharyngeal(NP) swabs in vial transport medium  Result Value Ref Range Status   SARS Coronavirus 2 by RT PCR NEGATIVE NEGATIVE Final    Comment: (NOTE) SARS-CoV-2 target nucleic acids are NOT DETECTED.  The SARS-CoV-2 RNA is generally detectable in upper respiratory specimens during the acute phase of infection. The lowest concentration of SARS-CoV-2 viral copies this assay can detect is 138 copies/mL. Koree Schopf negative result does not preclude SARS-Cov-2 infection and should not be used as the sole basis for treatment or other patient management decisions. Elexia Friedt negative result may occur with  improper specimen collection/handling, submission of specimen other than nasopharyngeal swab,  presence of viral mutation(s) within the areas targeted by this assay, and inadequate number of viral copies(<138 copies/mL). Demetrus Pavao negative result must be combined with clinical observations, patient history, and epidemiological information. The expected result is Negative.  Fact Sheet for Patients:  hEntrepreneurPulse.com.au Fact Sheet for Healthcare Providers:  hIncredibleEmployment.be This test is no t yet approved or cleared by the [place] and  has been authorized for detection and/or diagnosis of SARS-CoV-2 by FDA under an Emergency Use Authorization (EUA). This EUA will remain  in effect (meaning this test can be used) for the duration of the COVID-19 declaration under Section 564(b)(1) of the Act, 21 U.S.C.section 360bbb-3(b)(1), unless the authorization is terminated  or revoked sooner.       Influenza Juniel Groene by PCR NEGATIVE NEGATIVE Final   Influenza B by PCR NEGATIVE NEGATIVE Final    Comment: (NOTE) The Xpert Xpress SARS-CoV-2/FLU/RSV plus assay is intended as an aid in the diagnosis of influenza from Nasopharyngeal swab specimens and should not be used as Daniyla Pfahler sole basis for treatment. Nasal washings and aspirates are unacceptable for Xpert Xpress SARS-CoV-2/FLU/RSV testing.  Fact Sheet for Patients: hEntrepreneurPulse.com.au Fact Sheet for Healthcare Providers: hIncredibleEmployment.be This test is not yet approved or cleared by the  [place] Architectural technologist and has been authorized for detection and/or diagnosis of SARS-CoV-2 by FDA under an Print production planner (EUA). This EUA will remain in effect (meaning this test can be used) for the duration of the COVID-19 declaration under Section 564(b)(1) of the Act, 21 U.S.C. section 360bbb-3(b)(1), unless the authorization is terminated or revoked.  Performed at [facility], [place], [place] [address]   C Difficile  Quick Screen w PCR reflex     Status: None   Collection Time: 04/12/21  2:16 AM   Specimen: STOOL  Result Value Ref Range Status   C Diff antigen NEGATIVE NEGATIVE Final   C Diff toxin NEGATIVE NEGATIVE Final   C Diff interpretation No C. difficile detected.  Final    Comment: Performed at [facility], [address] [place], [place] [address]  Gastrointestinal Panel by PCR , Stool     Status: None   Collection Time: 04/12/21  2:16 AM   Specimen: Stool  Result Value Ref Range Status   Campylobacter species NOT DETECTED NOT DETECTED Final   Plesimonas shigelloides NOT DETECTED NOT DETECTED Final   Salmonella species NOT DETECTED NOT DETECTED Final   Yersinia enterocolitica NOT DETECTED NOT DETECTED Final   Vibrio species NOT DETECTED NOT DETECTED Final   Vibrio cholerae NOT DETECTED NOT DETECTED Final   Enteroaggregative E coli (EAEC) NOT DETECTED NOT DETECTED Final   Enteropathogenic E coli (EPEC) NOT DETECTED NOT DETECTED Final   Enterotoxigenic E coli (ETEC) NOT DETECTED NOT DETECTED Final   Shiga like toxin producing E coli (STEC) NOT DETECTED NOT DETECTED Final   Shigella/Enteroinvasive E coli (EIEC) NOT DETECTED NOT DETECTED Final   Cryptosporidium NOT DETECTED NOT DETECTED Final   Cyclospora cayetanensis NOT DETECTED NOT DETECTED Final   Entamoeba histolytica NOT DETECTED NOT DETECTED Final   Giardia lamblia NOT DETECTED NOT DETECTED Final   Adenovirus F40/41 NOT DETECTED NOT DETECTED Final   Astrovirus NOT DETECTED NOT DETECTED Final   Norovirus GI/GII NOT DETECTED NOT DETECTED Final   Rotavirus Davian Wollenberg NOT DETECTED NOT DETECTED Final   Sapovirus (I, II, IV, and V) NOT DETECTED NOT DETECTED Final    Comment: Performed at [facility], [address] [place], [place] [address]     Labs: Basic Metabolic Panel: Recent Labs  Lab 04/11/21 1614 04/12/21 0700 04/12/21 1151 04/13/21 0610  NA 128* 130*  --  136  K 5.2* 5.3*  --  4.1  CL 93* 92*  --  96*   CO2 18* 15*  --  27  GLUCOSE 120* 108*  --  87  BUN 106* 123*  --  53*  CREATININE 14.08* 15.84*  --  9.79*  CALCIUM 9.7 9.1  --  8.6*  MG  --   --  2.1 2.0  PHOS  --   --   --  3.6   Liver Function Tests: Recent Labs  Lab 04/11/21 1614 04/12/21 0700 04/13/21 0610  AST 13* 7* 11*  ALT '16 13 11  '$ ALKPHOS 65 77 60  BILITOT 1.0 0.9 0.9  PROT 7.9 7.5 7.8  ALBUMIN 3.9 3.7 3.7   Recent Labs  Lab 04/11/21 1614  LIPASE 26   No results for input(s): AMMONIA in the last 168 hours. CBC: Recent Labs  Lab 04/11/21 1614 04/12/21 0700 04/13/21 0610  WBC 6.5 5.9 4.2  HGB 9.5* 9.1* 9.0*  HCT 28.4* 27.0* 25.9*  MCV 99.0 97.5 98.9  PLT 159 144* 145*  Cardiac Enzymes: No results for input(s): CKTOTAL, CKMB, CKMBINDEX, TROPONINI in the last 168 hours. BNP: BNP (last 3 results) Recent Labs    04/11/21 1614  BNP >4,500.0*    ProBNP (last 3 results) No results for input(s): PROBNP in the last 8760 hours.  CBG: Recent Labs  Lab 04/12/21 1014 04/12/21 1304 04/12/21 1548 04/13/21 0612 04/13/21 0801  GLUCAP 108* 95 81 84 93       Signed:  Fayrene Helper MD.  [facility] 04/13/2021, 10:41 AM

## 2021-04-13 NOTE — Progress Notes (Signed)
CC: ABd pain  Subjective: Abdominal pain has resolved.  He had a hamburger for supper last night.  No nausea no vomiting.  No fevers Had dialyzed and feels much better. Wbc nml,   Objective: Vital signs in last 24 hours: Temp:  [97.9 F (36.6 C)-99.1 F (37.3 C)] 98.8 F (37.1 C) (06/30 0800) Pulse Rate:  [69-80] 73 (06/30 0800) Resp:  [11-18] 16 (06/30 0800) BP: (162-195)/(62-86) 182/70 (06/30 0800) SpO2:  [97 %-100 %] 98 % (06/30 0800)    Intake/Output from previous day: 06/29 0701 - 06/30 0700 In: -  Out: 500  Intake/Output this shift: No intake/output data recorded.  Physical exam: NAD , alert Abd: soft, nt , no rebound or peritonitis Ext: warm and well perfused   Lab Results: CBC  Recent Labs    04/12/21 0700 04/13/21 0610  WBC 5.9 4.2  HGB 9.1* 9.0*  HCT 27.0* 25.9*  PLT 144* 145*   BMET Recent Labs    04/12/21 0700 04/13/21 0610  NA 130* 136  K 5.3* 4.1  CL 92* 96*  CO2 15* 27  GLUCOSE 108* 87  BUN 123* 53*  CREATININE 15.84* 9.79*  CALCIUM 9.1 8.6*   PT/INR No results for input(s): LABPROT, INR in the last 72 hours. ABG No results for input(s): PHART, HCO3 in the last 72 hours.  Invalid input(s): PCO2, PO2  Studies/Results: CT ABDOMEN PELVIS WO CONTRAST  Result Date: 04/11/2021 CLINICAL DATA:  Abdominal pain, acute, nonlocalized EXAM: CT ABDOMEN AND PELVIS WITHOUT CONTRAST TECHNIQUE: Multidetector CT imaging of the abdomen and pelvis was performed following the standard protocol without IV contrast. COMPARISON:  CT 12/30/2013 FINDINGS: Lower chest: Mild interlobular septal thickening and ground-glass opacities in the lung bases, likely edema. Enlarged heart with mitral annular, coronary artery and aortic valve calcifications. Hepatobiliary: Mild periportal edema, likely from volume overload. No gallstones, gallbladder wall thickening, or biliary dilatation. Pancreas: Unremarkable. No pancreatic ductal dilatation or surrounding inflammatory  changes. Spleen: Normal in size without focal abnormality. Adrenals/Urinary Tract: Adrenal glands are unremarkable. Atrophic kidneys with multiple bilateral renal cysts, consistent with acquired cystic renal disease of dialysis, this is new since 2015. Bladder is nondistended. Stomach/Bowel: Small hiatal hernia. The stomach is unremarkable. There is no evidence of bowel obstruction. There are two appendicoliths, one near the base of the appendix measuring 3 mm (axial image 68) and another distally measuring 3 mm (axial image 72). The appendix is distended measuring 7 mm, with some periappendiceal inflammatory change. There is no adjacent abscess. There is a punctate foci of gas within the appendix distally. Vascular/Lymphatic: Aortoiliac atherosclerotic calcifications. There is no lymphadenopathy. Reproductive: Mildly enlarged prostate. Other: Small fat containing right inguinal hernia. Musculoskeletal: No acute or significant osseous findings. IMPRESSION: Distended appendix measuring 7 mm with small appendicoliths and adjacent periappendiceal inflammatory change. No adjacent abscess. Findings could reflect early appendicitis, although a punctate foci of gas is seen within the distal appendix. Atrophic kidneys with multiple bilateral cysts consistent with a acquired cystic renal disease of dialysis. Cardiomegaly with pulmonary edema noted in the lung bases. Appendix: Location: Anterior pelvis Diameter: 7 mm Appendicolith: Present, two measuring 77m each Mucosal hyper-enhancement: Minimal Extraluminal gas: None Periappendiceal collection: None Electronically Signed   By: JMaurine Simmering  On: 04/11/2021 18:06   DG Chest Portable 1 View  Result Date: 04/11/2021 CLINICAL DATA:  55year old male with chest pain and cough. EXAM: PORTABLE CHEST 1 VIEW COMPARISON:  Chest radiograph dated 01/19/2015 FINDINGS: There is cardiomegaly with vascular congestion. No  focal consolidation, pleural effusion, pneumothorax.  Atherosclerotic calcification of the aortic arch. No acute osseous pathology. IMPRESSION: Cardiomegaly with vascular congestion. No focal consolidation. Electronically Signed   By: Anner Crete M.D.   On: 04/11/2021 17:41    Anti-infectives: Anti-infectives (From admission, onward)    None       Assessment/Plan:  Abdominal pain plan clinical no evidence of appendicitis.  Emptiness have resolved.  Likely colitis and uremia.  No need for surgical intervention.  I will follow him up in a couple weeks and at that time we will determine whether or not it would be prudent to remove the appendix.  I do think that this is personal decision.  Extensive counseling provided.  Please note that I spent more than 35 minutes in this encounter with greater than 50% spent coordination and counseling of his care. Case d/w detail w Dr. Mercie Eon, MD, [facility]  04/13/2021

## 2021-04-13 NOTE — Plan of Care (Signed)

## 2021-04-13 NOTE — Progress Notes (Signed)
[facility]  ROUNDING NOTE   Subjective:   Jerry [name] is a 55 y.o. male with past medical conditions including diabetes, hypertension and ESRD on HD. He presents to the Ed with complaints of diarrhea that began on Monday.  Patient currently receives dialysis at [facility]. He reports missing his treatment on Monday due to not feeling well. We have been consulted to manage dialysis while inpatient. Patient is spanish speaking and the video interpretor was used during this encounter.   Patient seen sitting at side of bed Alert and oriented Tolerating meals, denies nausea and vomiting Denies diarrhea Dialysis yesterday, tolerated well   Objective:  Vital signs in last 24 hours:  Temp:  [97.9 F (36.6 C)-99.1 F (37.3 C)] 98.8 F (37.1 C) (06/30 0800) Pulse Rate:  [69-80] 73 (06/30 0800) Resp:  [11-18] 16 (06/30 0800) BP: (162-195)/(62-86) 182/70 (06/30 0800) SpO2:  [97 %-100 %] 98 % (06/30 0800)  Weight change:  There were no vitals filed for this visit.  Intake/Output: I/O last 3 completed shifts: In: -  Out: 500 [Other:500]   Intake/Output this shift:  No intake/output data recorded.  Physical Exam: General: NAD, laying in bed  Head: Normocephalic, atraumatic. Moist oral mucosal membranes  Eyes: Anicteric  Lungs:  Clear to auscultation, normal effort  Heart: Regular rate and rhythm  Abdomen:  Soft, nontender  Extremities:  no peripheral edema.  Neurologic: Nonfocal, moving all four extremities  Skin: No lesions  Access: Lt AVF    Basic Metabolic Panel: Recent Labs  Lab 04/11/21 1614 04/12/21 0700 04/12/21 1151 04/13/21 0610  NA 128* 130*  --  136  K 5.2* 5.3*  --  4.1  CL 93* 92*  --  96*  CO2 18* 15*  --  27  GLUCOSE 120* 108*  --  87  BUN 106* 123*  --  53*  CREATININE 14.08* 15.84*  --  9.79*  CALCIUM 9.7 9.1  --  8.6*  MG  --   --  2.1 2.0  PHOS  --   --   --  3.6     Liver Function Tests: Recent Labs  Lab  04/11/21 1614 04/12/21 0700 04/13/21 0610  AST 13* 7* 11*  ALT '16 13 11  '$ ALKPHOS 65 77 60  BILITOT 1.0 0.9 0.9  PROT 7.9 7.5 7.8  ALBUMIN 3.9 3.7 3.7    Recent Labs  Lab 04/11/21 1614  LIPASE 26    No results for input(s): AMMONIA in the last 168 hours.  CBC: Recent Labs  Lab 04/11/21 1614 04/12/21 0700 04/13/21 0610  WBC 6.5 5.9 4.2  HGB 9.5* 9.1* 9.0*  HCT 28.4* 27.0* 25.9*  MCV 99.0 97.5 98.9  PLT 159 144* 145*     Cardiac Enzymes: No results for input(s): CKTOTAL, CKMB, CKMBINDEX, TROPONINI in the last 168 hours.  BNP: Invalid input(s): POCBNP  CBG: Recent Labs  Lab 04/12/21 1014 04/12/21 1304 04/12/21 1548 04/13/21 0612 04/13/21 0801  GLUCAP 108* 95 81 84 93     Microbiology: Results for orders placed or performed during the hospital encounter of 04/11/21  Resp Panel by RT-PCR (Flu A&B, Covid) Nasopharyngeal Swab     Status: None   Collection Time: 04/11/21  6:03 PM   Specimen: Nasopharyngeal Swab; Nasopharyngeal(NP) swabs in vial transport medium  Result Value Ref Range Status   SARS Coronavirus 2 by RT PCR NEGATIVE NEGATIVE Final    Comment: (NOTE) SARS-CoV-2 target nucleic acids are NOT DETECTED.  The  SARS-CoV-2 RNA is generally detectable in upper respiratory specimens during the acute phase of infection. The lowest concentration of SARS-CoV-2 viral copies this assay can detect is 138 copies/mL. A negative result does not preclude SARS-Cov-2 infection and should not be used as the sole basis for treatment or other patient management decisions. A negative result may occur with  improper specimen collection/handling, submission of specimen other than nasopharyngeal swab, presence of viral mutation(s) within the areas targeted by this assay, and inadequate number of viral copies(<138 copies/mL). A negative result must be combined with clinical observations, patient history, and epidemiological information. The expected result is  Negative.  Fact Sheet for Patients:  EntrepreneurPulse.com.au  Fact Sheet for Healthcare Providers:  IncredibleEmployment.be  This test is no t yet approved or cleared by the [place] FDA and  has been authorized for detection and/or diagnosis of SARS-CoV-2 by FDA under an Emergency Use Authorization (EUA). This EUA will remain  in effect (meaning this test can be used) for the duration of the COVID-19 declaration under Section 564(b)(1) of the Act, 21 U.S.C.section 360bbb-3(b)(1), unless the authorization is terminated  or revoked sooner.       Influenza A by PCR NEGATIVE NEGATIVE Final   Influenza B by PCR NEGATIVE NEGATIVE Final    Comment: (NOTE) The Xpert Xpress SARS-CoV-2/FLU/RSV plus assay is intended as an aid in the diagnosis of influenza from Nasopharyngeal swab specimens and should not be used as a sole basis for treatment. Nasal washings and aspirates are unacceptable for Xpert Xpress SARS-CoV-2/FLU/RSV testing.  Fact Sheet for Patients: EntrepreneurPulse.com.au  Fact Sheet for Healthcare Providers: IncredibleEmployment.be  This test is not yet approved or cleared by the [place] FDA and has been authorized for detection and/or diagnosis of SARS-CoV-2 by FDA under an Emergency Use Authorization (EUA). This EUA will remain in effect (meaning this test can be used) for the duration of the COVID-19 declaration under Section 564(b)(1) of the Act, 21 U.S.C. section 360bbb-3(b)(1), unless the authorization is terminated or revoked.  Performed at [facility], [place], [place] [address]   C Difficile Quick Screen w PCR reflex     Status: None   Collection Time: 04/12/21  2:16 AM   Specimen: STOOL  Result Value Ref Range Status   C Diff antigen NEGATIVE NEGATIVE Final   C Diff toxin NEGATIVE NEGATIVE Final   C Diff interpretation No C. difficile detected.   Final    Comment: Performed at [facility], [address] [place], [place] [address]  Gastrointestinal Panel by PCR , Stool     Status: None   Collection Time: 04/12/21  2:16 AM   Specimen: Stool  Result Value Ref Range Status   Campylobacter species NOT DETECTED NOT DETECTED Final   Plesimonas shigelloides NOT DETECTED NOT DETECTED Final   Salmonella species NOT DETECTED NOT DETECTED Final   Yersinia enterocolitica NOT DETECTED NOT DETECTED Final   Vibrio species NOT DETECTED NOT DETECTED Final   Vibrio cholerae NOT DETECTED NOT DETECTED Final   Enteroaggregative E coli (EAEC) NOT DETECTED NOT DETECTED Final   Enteropathogenic E coli (EPEC) NOT DETECTED NOT DETECTED Final   Enterotoxigenic E coli (ETEC) NOT DETECTED NOT DETECTED Final   Shiga like toxin producing E coli (STEC) NOT DETECTED NOT DETECTED Final   Shigella/Enteroinvasive E coli (EIEC) NOT DETECTED NOT DETECTED Final   Cryptosporidium NOT DETECTED NOT DETECTED Final   Cyclospora cayetanensis NOT DETECTED NOT DETECTED Final   Entamoeba histolytica NOT  DETECTED NOT DETECTED Final   Giardia lamblia NOT DETECTED NOT DETECTED Final   Adenovirus F40/41 NOT DETECTED NOT DETECTED Final   Astrovirus NOT DETECTED NOT DETECTED Final   Norovirus GI/GII NOT DETECTED NOT DETECTED Final   Rotavirus A NOT DETECTED NOT DETECTED Final   Sapovirus (I, II, IV, and V) NOT DETECTED NOT DETECTED Final    Comment: Performed at [facility], [address] [place], [place] [address]    Coagulation Studies: No results for input(s): LABPROT, INR in the last 72 hours.  Urinalysis: No results for input(s): COLORURINE, LABSPEC, PHURINE, GLUCOSEU, HGBUR, BILIRUBINUR, KETONESUR, PROTEINUR, UROBILINOGEN, NITRITE, LEUKOCYTESUR in the last 72 hours.  Invalid input(s): APPERANCEUR    Imaging: CT ABDOMEN PELVIS WO CONTRAST  Result Date: 04/11/2021 CLINICAL DATA:  Abdominal pain, acute, nonlocalized EXAM: CT ABDOMEN AND PELVIS  WITHOUT CONTRAST TECHNIQUE: Multidetector CT imaging of the abdomen and pelvis was performed following the standard protocol without IV contrast. COMPARISON:  CT 12/30/2013 FINDINGS: Lower chest: Mild interlobular septal thickening and ground-glass opacities in the lung bases, likely edema. Enlarged heart with mitral annular, coronary artery and aortic valve calcifications. Hepatobiliary: Mild periportal edema, likely from volume overload. No gallstones, gallbladder wall thickening, or biliary dilatation. Pancreas: Unremarkable. No pancreatic ductal dilatation or surrounding inflammatory changes. Spleen: Normal in size without focal abnormality. Adrenals/Urinary Tract: Adrenal glands are unremarkable. Atrophic kidneys with multiple bilateral renal cysts, consistent with acquired cystic renal disease of dialysis, this is new since 2015. Bladder is nondistended. Stomach/Bowel: Small hiatal hernia. The stomach is unremarkable. There is no evidence of bowel obstruction. There are two appendicoliths, one near the base of the appendix measuring 3 mm (axial image 68) and another distally measuring 3 mm (axial image 72). The appendix is distended measuring 7 mm, with some periappendiceal inflammatory change. There is no adjacent abscess. There is a punctate foci of gas within the appendix distally. Vascular/Lymphatic: Aortoiliac atherosclerotic calcifications. There is no lymphadenopathy. Reproductive: Mildly enlarged prostate. Other: Small fat containing right inguinal hernia. Musculoskeletal: No acute or significant osseous findings. IMPRESSION: Distended appendix measuring 7 mm with small appendicoliths and adjacent periappendiceal inflammatory change. No adjacent abscess. Findings could reflect early appendicitis, although a punctate foci of gas is seen within the distal appendix. Atrophic kidneys with multiple bilateral cysts consistent with a acquired cystic renal disease of dialysis. Cardiomegaly with pulmonary edema  noted in the lung bases. Appendix: Location: Anterior pelvis Diameter: 7 mm Appendicolith: Present, two measuring 51m each Mucosal hyper-enhancement: Minimal Extraluminal gas: None Periappendiceal collection: None Electronically Signed   By: JMaurine Simmering  On: 04/11/2021 18:06   DG Chest Portable 1 View  Result Date: 04/11/2021 CLINICAL DATA:  55year old male with chest pain and cough. EXAM: PORTABLE CHEST 1 VIEW COMPARISON:  Chest radiograph dated 01/19/2015 FINDINGS: There is cardiomegaly with vascular congestion. No focal consolidation, pleural effusion, pneumothorax. Atherosclerotic calcification of the aortic arch. No acute osseous pathology. IMPRESSION: Cardiomegaly with vascular congestion. No focal consolidation. Electronically Signed   By: AAnner CreteM.D.   On: 04/11/2021 17:41     Medications:      Chlorhexidine Gluconate Cloth  6 each Topical Q0600   heparin  5,000 Units Subcutaneous Q8H   hydrALAZINE  10 mg Oral Q8H   insulin aspart  0-6 Units Subcutaneous TID WC   magnesium oxide  400 mg Oral QHS   polyethylene glycol  17 g Oral Daily   acetaminophen **OR** acetaminophen, albuterol, alum & mag hydroxide-simeth, hydrALAZINE, morphine injection  Assessment/ Plan:  Mr. Jerry [name] is a 55 y.o.  male ith past medical conditions including diabetes, hypertension and ESRD on HD. He presents to the Ed with complaints of diarrhea that began on Monday.  [facility]/MWF/Lt AVF/81.5 kg  End stage renal disease on dialysis Last hemodialysis 04/07/21 Received dialysis yesterday, tolerated well UF goal 0.5L achieved Next treatment scheduled for Friday  2. Anemia of chronic kidney disease  Lab Results  Component Value Date   HGB 9.0 (L) 04/13/2021  Hgb below goal Will monitor  3. Diabetes mellitus type II with chronic kidney disease Noninsulin dependent.  No recent Hgb A1c, primary team to order Stable  4. Secondary Hyperparathyroidism: with outpatient  labs: phosphorus 5.4, calcium 9.2 on 03/27/21.   Lab Results  Component Value Date   CALCIUM 8.6 (L) 04/13/2021   PHOS 3.6 04/13/2021    Renvela with meals Phosphorus at goal Will monitor  5. Hyponatremia Currently 136 Corrected with dialysis   LOS: 2   6/30/202210:38 AM

## 2021-05-03 ENCOUNTER — Inpatient Hospital Stay: Payer: Self-pay | Admitting: Surgery

## 2021-05-26 ENCOUNTER — Telehealth: Payer: Self-pay | Admitting: Surgery

## 2021-05-26 NOTE — Telephone Encounter (Signed)
Left a message for the patient to call the office, pt no showed appointment and needs to be r/s. Please r/s if the patient calls back.

## 2022-07-19 ENCOUNTER — Other Ambulatory Visit: Payer: Self-pay

## 2022-07-19 ENCOUNTER — Emergency Department: Payer: Medicaid Other

## 2022-07-19 ENCOUNTER — Inpatient Hospital Stay
Admission: EM | Admit: 2022-07-19 | Discharge: 2022-07-23 | DRG: 368 | Disposition: A | Discharge status: Home / Self Care | Payer: Medicaid Other | Attending: Internal Medicine | Admitting: Internal Medicine

## 2022-07-19 DIAGNOSIS — K226 Gastro-esophageal laceration-hemorrhage syndrome: Secondary | ICD-10-CM | POA: Diagnosis present

## 2022-07-19 DIAGNOSIS — K319 Disease of stomach and duodenum, unspecified: Secondary | ICD-10-CM | POA: Diagnosis present

## 2022-07-19 DIAGNOSIS — D631 Anemia in chronic kidney disease: Secondary | ICD-10-CM | POA: Diagnosis present

## 2022-07-19 DIAGNOSIS — Z79899 Other long term (current) drug therapy: Secondary | ICD-10-CM | POA: Diagnosis not present

## 2022-07-19 DIAGNOSIS — D62 Acute posthemorrhagic anemia: Secondary | ICD-10-CM | POA: Diagnosis present

## 2022-07-19 DIAGNOSIS — G8929 Other chronic pain: Secondary | ICD-10-CM | POA: Diagnosis present

## 2022-07-19 DIAGNOSIS — K921 Melena: Secondary | ICD-10-CM | POA: Diagnosis present

## 2022-07-19 DIAGNOSIS — E871 Hypo-osmolality and hyponatremia: Secondary | ICD-10-CM | POA: Diagnosis present

## 2022-07-19 DIAGNOSIS — I132 Hypertensive heart and chronic kidney disease with heart failure and with stage 5 chronic kidney disease, or end stage renal disease: Secondary | ICD-10-CM | POA: Diagnosis present

## 2022-07-19 DIAGNOSIS — Z841 Family history of disorders of kidney and ureter: Secondary | ICD-10-CM | POA: Diagnosis not present

## 2022-07-19 DIAGNOSIS — K254 Chronic or unspecified gastric ulcer with hemorrhage: Secondary | ICD-10-CM | POA: Diagnosis present

## 2022-07-19 DIAGNOSIS — E1122 Type 2 diabetes mellitus with diabetic chronic kidney disease: Secondary | ICD-10-CM | POA: Diagnosis present

## 2022-07-19 DIAGNOSIS — E1165 Type 2 diabetes mellitus with hyperglycemia: Secondary | ICD-10-CM | POA: Diagnosis present

## 2022-07-19 DIAGNOSIS — E875 Hyperkalemia: Secondary | ICD-10-CM | POA: Diagnosis present

## 2022-07-19 DIAGNOSIS — K922 Gastrointestinal hemorrhage, unspecified: Secondary | ICD-10-CM | POA: Diagnosis present

## 2022-07-19 DIAGNOSIS — K2970 Gastritis, unspecified, without bleeding: Secondary | ICD-10-CM | POA: Diagnosis present

## 2022-07-19 DIAGNOSIS — Z992 Dependence on renal dialysis: Secondary | ICD-10-CM

## 2022-07-19 DIAGNOSIS — D696 Thrombocytopenia, unspecified: Secondary | ICD-10-CM | POA: Diagnosis present

## 2022-07-19 DIAGNOSIS — R197 Diarrhea, unspecified: Principal | ICD-10-CM

## 2022-07-19 DIAGNOSIS — E119 Type 2 diabetes mellitus without complications: Secondary | ICD-10-CM

## 2022-07-19 DIAGNOSIS — N186 End stage renal disease: Secondary | ICD-10-CM | POA: Diagnosis present

## 2022-07-19 DIAGNOSIS — Z20822 Contact with and (suspected) exposure to covid-19: Secondary | ICD-10-CM | POA: Diagnosis present

## 2022-07-19 DIAGNOSIS — K2091 Esophagitis, unspecified with bleeding: Secondary | ICD-10-CM | POA: Diagnosis present

## 2022-07-19 DIAGNOSIS — E1142 Type 2 diabetes mellitus with diabetic polyneuropathy: Secondary | ICD-10-CM | POA: Diagnosis present

## 2022-07-19 DIAGNOSIS — I10 Essential (primary) hypertension: Secondary | ICD-10-CM | POA: Diagnosis present

## 2022-07-19 DIAGNOSIS — I509 Heart failure, unspecified: Secondary | ICD-10-CM | POA: Diagnosis present

## 2022-07-19 DIAGNOSIS — R112 Nausea with vomiting, unspecified: Secondary | ICD-10-CM

## 2022-07-19 DIAGNOSIS — N2581 Secondary hyperparathyroidism of renal origin: Secondary | ICD-10-CM | POA: Diagnosis present

## 2022-07-19 DIAGNOSIS — K920 Hematemesis: Secondary | ICD-10-CM

## 2022-07-19 LAB — CBC WITH DIFFERENTIAL/PLATELET
Abs Immature Granulocytes: 0.03 10*3/uL (ref 0.00–0.07)
Basophils Absolute: 0 10*3/uL (ref 0.0–0.1)
Basophils Relative: 0 %
Eosinophils Absolute: 0 10*3/uL (ref 0.0–0.5)
Eosinophils Relative: 1 %
HCT: 15.6 % — ABNORMAL LOW (ref 39.0–52.0)
Hemoglobin: 5 g/dL — ABNORMAL LOW (ref 13.0–17.0)
Immature Granulocytes: 1 %
Lymphocytes Relative: 11 %
Lymphs Abs: 0.6 10*3/uL — ABNORMAL LOW (ref 0.7–4.0)
MCH: 32.9 pg (ref 26.0–34.0)
MCHC: 32.1 g/dL (ref 30.0–36.0)
MCV: 102.6 fL — ABNORMAL HIGH (ref 80.0–100.0)
Monocytes Absolute: 0.4 10*3/uL (ref 0.1–1.0)
Monocytes Relative: 8 %
Neutro Abs: 4.3 10*3/uL (ref 1.7–7.7)
Neutrophils Relative %: 79 %
Platelets: 106 10*3/uL — ABNORMAL LOW (ref 150–400)
RBC: 1.52 MIL/uL — ABNORMAL LOW (ref 4.22–5.81)
RDW: 18.1 % — ABNORMAL HIGH (ref 11.5–15.5)
WBC: 5.3 10*3/uL (ref 4.0–10.5)
nRBC: 0 % (ref 0.0–0.2)

## 2022-07-19 LAB — CBC
HCT: 15.4 % — ABNORMAL LOW (ref 39.0–52.0)
Hemoglobin: 5 g/dL — ABNORMAL LOW (ref 13.0–17.0)
MCH: 33.3 pg (ref 26.0–34.0)
MCHC: 32.5 g/dL (ref 30.0–36.0)
MCV: 102.7 fL — ABNORMAL HIGH (ref 80.0–100.0)
Platelets: 111 K/uL — ABNORMAL LOW (ref 150–400)
RBC: 1.5 MIL/uL — ABNORMAL LOW (ref 4.22–5.81)
RDW: 17.9 % — ABNORMAL HIGH (ref 11.5–15.5)
WBC: 5.3 K/uL (ref 4.0–10.5)
nRBC: 0 % (ref 0.0–0.2)

## 2022-07-19 LAB — COMPREHENSIVE METABOLIC PANEL
ALT: 33 U/L (ref 0–44)
AST: 31 U/L (ref 15–41)
Albumin: 3 g/dL — ABNORMAL LOW (ref 3.5–5.0)
Alkaline Phosphatase: 221 U/L — ABNORMAL HIGH (ref 38–126)
Anion gap: 13 (ref 5–15)
BUN: 86 mg/dL — ABNORMAL HIGH (ref 6–20)
CO2: 24 mmol/L (ref 22–32)
Calcium: 8.9 mg/dL (ref 8.9–10.3)
Chloride: 96 mmol/L — ABNORMAL LOW (ref 98–111)
Creatinine, Ser: 7.92 mg/dL — ABNORMAL HIGH (ref 0.61–1.24)
GFR, Estimated: 7 mL/min — ABNORMAL LOW (ref >60)
Glucose, Bld: 210 mg/dL — ABNORMAL HIGH (ref 70–99)
Potassium: 6.7 mmol/L (ref 3.5–5.1)
Sodium: 133 mmol/L — ABNORMAL LOW (ref 135–145)
Total Bilirubin: 1 mg/dL (ref 0.3–1.2)
Total Protein: 6.8 g/dL (ref 6.5–8.1)

## 2022-07-19 LAB — LIPASE, BLOOD: Lipase: 29 U/L (ref 11–51)

## 2022-07-19 LAB — TROPONIN I (HIGH SENSITIVITY): Troponin I (High Sensitivity): 51 ng/L — ABNORMAL HIGH (ref <18)

## 2022-07-19 LAB — BRAIN NATRIURETIC PEPTIDE: B Natriuretic Peptide: 4447.3 pg/mL — ABNORMAL HIGH (ref 0.0–100.0)

## 2022-07-19 MED ORDER — PANTOPRAZOLE INFUSION (NEW) - SIMPLE MED
8.0000 mg/h | INTRAVENOUS | Status: DC
  Administered 2022-07-19 – 2022-07-21 (×6): 8 mg/h via INTRAVENOUS
  Filled 2022-07-19 (×2): qty 100
  Filled 2022-07-19: qty 80
  Filled 2022-07-19 (×3): qty 100

## 2022-07-19 MED ORDER — INSULIN ASPART 100 UNIT/ML IJ SOLN
5.0000 [IU] | Freq: Once | INTRAMUSCULAR | Status: AC
  Administered 2022-07-19: 5 [IU] via INTRAVENOUS
  Filled 2022-07-19: qty 1

## 2022-07-19 MED ORDER — LIDOCAINE-PRILOCAINE 2.5-2.5 % EX CREA: 1.0000 | TOPICAL_CREAM | CUTANEOUS | Status: DC | PRN

## 2022-07-19 MED ORDER — SODIUM CHLORIDE 0.9 % IV SOLN: 10.0000 mL/h | Freq: Once | INTRAVENOUS | Status: DC

## 2022-07-19 MED ORDER — PENTAFLUOROPROP-TETRAFLUOROETH EX AERO
1.0000 | INHALATION_SPRAY | CUTANEOUS | Status: DC | PRN
  Filled 2022-07-19: qty 30

## 2022-07-19 MED ORDER — PANTOPRAZOLE 80MG IVPB - SIMPLE MED
80.0000 mg | Freq: Once | INTRAVENOUS | Status: AC
  Administered 2022-07-19: 80 mg via INTRAVENOUS
  Filled 2022-07-19: qty 80

## 2022-07-19 MED ORDER — LIDOCAINE HCL (PF) 1 % IJ SOLN: 5.0000 mL | INTRAMUSCULAR | Status: DC | PRN

## 2022-07-19 MED ORDER — PANTOPRAZOLE 80MG IVPB - SIMPLE MED
80.0000 mg | Freq: Once | INTRAVENOUS | Status: DC
  Filled 2022-07-19: qty 100

## 2022-07-19 MED ORDER — PANTOPRAZOLE SODIUM 40 MG IV SOLR: 40.0000 mg | Freq: Two times a day (BID) | INTRAVENOUS | Status: DC

## 2022-07-19 MED ORDER — SODIUM CHLORIDE 0.9 % IV BOLUS
1000.0000 mL | Freq: Once | INTRAVENOUS | Status: AC
  Administered 2022-07-19: 1000 mL via INTRAVENOUS

## 2022-07-19 MED ORDER — HEPARIN SODIUM (PORCINE) 1000 UNIT/ML DIALYSIS
1000.0000 [IU] | INTRAMUSCULAR | Status: DC | PRN
  Filled 2022-07-19: qty 1

## 2022-07-19 MED ORDER — ALTEPLASE 2 MG IJ SOLR
2.0000 mg | Freq: Once | INTRAMUSCULAR | Status: DC | PRN
  Filled 2022-07-19: qty 2

## 2022-07-19 MED ORDER — ANTICOAGULANT SODIUM CITRATE 4% (200MG/5ML) IV SOLN
5.0000 mL | Status: DC | PRN
  Filled 2022-07-19: qty 5

## 2022-07-19 MED ORDER — ONDANSETRON HCL 4 MG/2ML IJ SOLN
4.0000 mg | Freq: Once | INTRAMUSCULAR | Status: AC
  Administered 2022-07-19: 4 mg via INTRAVENOUS
  Filled 2022-07-19: qty 2

## 2022-07-19 MED ORDER — SODIUM BICARBONATE 8.4 % IV SOLN
50.0000 meq | Freq: Once | INTRAVENOUS | Status: AC
  Administered 2022-07-19: 50 meq via INTRAVENOUS
  Filled 2022-07-19 (×2): qty 50

## 2022-07-19 MED ORDER — DEXTROSE 50 % IV SOLN
1.0000 | Freq: Once | INTRAVENOUS | Status: AC
  Administered 2022-07-19: 50 mL via INTRAVENOUS
  Filled 2022-07-19: qty 50

## 2022-07-19 MED ORDER — PANTOPRAZOLE INFUSION (NEW) - SIMPLE MED: 8.0000 mg/h | INTRAVENOUS | Status: DC

## 2022-07-19 MED ORDER — PANTOPRAZOLE INFUSION (NEW) - SIMPLE MED
8.0000 mg/h | INTRAVENOUS | Status: DC
  Filled 2022-07-19 (×2): qty 100

## 2022-07-19 MED ORDER — CHLORHEXIDINE GLUCONATE CLOTH 2 % EX PADS
6.0000 | MEDICATED_PAD | Freq: Every day | CUTANEOUS | Status: DC
  Administered 2022-07-20: 6 via TOPICAL
  Filled 2022-07-19: qty 6

## 2022-07-19 NOTE — ED Notes (Signed)
Dessie RN aware of assigned bed

## 2022-07-19 NOTE — H&P (Signed)
History and Physical    Patient: Jerry [name] HER:[id number] DOB: [birth year] DOA: 07/19/2022 DOS: the patient was seen and examined on 07/19/2022 PCP: Freddy Finner, NP  Patient coming from: Home  Chief Complaint:  Chief Complaint  Patient presents with   Diarrhea   Cough   HPI: Jerry [name] is a 56 y.o. male with medical history significant of end-stage renal disease on hemodialysis, diabetes, essential hypertension, who came in from home with complaint of diarrhea since yesterday as well as weakness.  He did not report vomiting on arrival however patient started having vomiting in the ER which was coffee-ground emesis.  Has also melena noted.  Patient has also elevated potassium as well as worsening renal function with a creatinine of more than 7.  His vitals are however stable.  Stool guaiac is positive.  No obvious cause of bleeding.  Not on anticoagulation.  Denied any active NSAIDs use.  Patient at this point is presumed to have upper GI bleed with hemoglobin down to 5.  Also hyperkalemia and uremia may be the cause of the GI bleed.  Nonalcoholic no cirrhosis.  GI and nephrology consulted and patient being admitted to the medical service in the stepdown unit.  ICU has been consulted but patient's blood pressure is stable so at this point will be admitted to the hospitalist service.  Review of Systems: As mentioned in the history of present illness. All other systems reviewed and are negative. Past Medical History:  Diagnosis Date   Diabetes mellitus ([place])    ESRD on hemodialysis ([place])    Hypertension    Renal disorder    Past Surgical History:  Procedure Laterality Date   AV FISTULA PLACEMENT Left    FOOT SURGERY     Social History:  reports that he has never smoked. He has never used smokeless tobacco. He reports current alcohol use. He reports that he does not use drugs.  No Known Allergies  Family History  Problem Relation Age of Onset   Kidney failure  Mother    Glaucoma Mother    Kidney failure Father     Prior to Admission medications   Medication Sig Start Date End Date Taking? Authorizing Provider  calcium carbonate (TUMS EX) 750 MG chewable tablet Chew by mouth 3 (three) times daily. With meals    [provider]  gabapentin (NEURONTIN) 300 MG capsule Take 1 capsule (300 mg total) by mouth every Monday, Wednesday, and Friday at 6 PM. After dialysis 04/14/21   Elodia Florence., MD  hydrALAZINE (APRESOLINE) 25 MG tablet Take 1 tablet (25 mg total) by mouth 3 (three) times daily. 04/13/21 06/12/21  Elodia Florence., MD  polyethylene glycol (MIRALAX) 17 g packet Take 17 g by mouth daily. 04/13/21   Elodia Florence., MD  RENVELA 800 MG tablet Take 1,600 mg by mouth 2 (two) times daily. After meals 10/24/20   [provider]  traMADol (ULTRAM) 50 MG tablet Take 100 mg by mouth every 12 (twelve) hours as needed. In the morning and evening. 01/18/21   [provider]    Physical Exam: Vitals:   07/19/22 1917 07/19/22 2100 07/19/22 2130 07/19/22 2200  BP:  (!) 137/50 (!) 131/55 (!) 137/58  Pulse:  80 78 78  Resp:  15 15 13   Temp:      TempSrc:      SpO2:  98% 98% 100%  Weight: 78 kg     Height: 5\' 6"  (1.676  m)      Constitutional: Acutely ill looking, NAD, calm, comfortable Eyes: PERRL, lids and conjunctivae pale ENMT: Mucous membranes are moist. Posterior pharynx clear of any exudate or lesions.Normal dentition.  Neck: normal, supple, no masses, no thyromegaly Respiratory: clear to auscultation bilaterally, no wheezing, no crackles. Normal respiratory effort. No accessory muscle use.  Cardiovascular: Sinus tachycardia, no murmurs / rubs / gallops. No extremity edema. 2+ pedal pulses. No carotid bruits.  Abdomen: no tenderness, no masses palpated. No hepatosplenomegaly. Bowel sounds positive.  Musculoskeletal: Good range of motion, no joint swelling or tenderness, Skin: no rashes, lesions, ulcers.  No induration Neurologic: CN 2-12 grossly intact. Sensation intact, DTR normal. Strength 5/5 in all 4.  Psychiatric: Normal judgment and insight. Alert and oriented x 3.  Anxious mood  Data Reviewed:  Sodium 133, potassium 6.7, chloride 96, CO2 24, glucose 210, BUN 86, creatinine 7.92, alkaline phosphate 221, albumin 3.0, BNP 4447 and troponin 51.  White count is 5.3 with hemoglobin 5.0.  Chest x-ray showed cardiomegaly with mild to moderate interstitial lung markings.  Assessment and Plan:  #1 upper GI bleed: Patient will be admitted.  Initiate IV Protonix.  Serial CBCs to check H&H.  GI consulted.  He will be n.p.o. for EGD in the morning.  Admit to stepdown unit.  Transfuse at least 2 units of packed red blood cells and monitor H&H.  #2 end-stage renal disease with hyperkalemia: Potassium is 6.7.  Nephrology consulted and Dr. Candiss Norse is planning hemodialysis tonight.  #3 symptomatic anemia: Due to GI bleed.  Continue as per #1 above.  #4 uncontrolled diabetes: Sliding scale insulin.  Continue to monitor  #5 essential hypertension: Blood pressures well controlled.  Continue monitoring  #6 hyponatremia: Continue per nephrology.    Advance Care Planning:   Code Status: Prior full  Consults: Dr. Candiss Norse, nephrology, gastroenterology  Family Communication: No family at bedside  Severity of Illness: The appropriate patient status for this patient is INPATIENT. Inpatient status is judged to be reasonable and necessary in order to provide the required intensity of service to ensure the patient's safety. The patient's presenting symptoms, physical exam findings, and initial radiographic and laboratory data in the context of their chronic comorbidities is felt to place them at high risk for further clinical deterioration. Furthermore, it is not anticipated that the patient will be medically stable for discharge from the hospital within 2 midnights of admission.   * I certify that at the point of  admission it is my clinical judgment that the patient will require inpatient hospital care spanning beyond 2 midnights from the point of admission due to high intensity of service, high risk for further deterioration and high frequency of surveillance required.*  AuthorBarbette Merino, MD 07/19/2022 10:38 PM  For on call review [email or web address].

## 2022-07-19 NOTE — ED Notes (Addendum)
Pt actively vomiting in lobby; noted Hgb 5.0; acuity level changed and pt taken to room 2 by EDT [place] for further evaluation; care nurse Gwenevere Ghazi, RNnotified

## 2022-07-19 NOTE — ED Triage Notes (Addendum)
Pt reports diarrhea since yesterday.  No abd pain.  No vomiting.  Pt also has a cough since yesterday.   No fever .  No chest pain or sob.  Pt alert  speech clear.  Pt is dialysis pt.

## 2022-07-19 NOTE — Progress Notes (Signed)
Dialysis staff had been notified of the urgent treatment. Orders have been placed.

## 2022-07-19 NOTE — ED Provider Notes (Signed)
[facility] Provider Note    Event Date/Time   First MD Initiated Contact with Patient 07/19/22 2000     (approximate)   History   Diarrhea and Cough   HPI  Jerry [name] is a 56 y.o. male who got dialysis yesterday.  He reports last night he developed frequent diarrhea this persisted multiple times until shortly before he got here when it stopped.  He vomited once at home today 3 times in the waiting room and then once here in the emergency department.  Here in the emergency department was coffee-ground emesis.  He feels very bad and weak and tired.  He is so weak that he has trouble walking.      Physical Exam   Triage Vital Signs: ED Triage Vitals  Enc Vitals Group     BP 07/19/22 1916 (!) 144/55     Pulse Rate 07/19/22 1916 88     Resp 07/19/22 1916 20     Temp 07/19/22 1916 98.7 F (37.1 C)     Temp Source 07/19/22 1916 Oral     SpO2 07/19/22 1916 95 %     Weight 07/19/22 1917 171 lb 15.3 oz (78 kg)     Height 07/19/22 1917 5\' 6"  (1.676 m)     Head Circumference --      Peak Flow --      Pain Score 07/19/22 1916 0     Pain Loc --      Pain Edu? --      Excl. in [place]? --     Most recent vital signs: Vitals:   07/19/22 2200 07/19/22 2341  BP: (!) 137/58   Pulse: 78 73  Resp: 13 11  Temp:    SpO2: 100% 96%     General: Awake, no distress.  Skin looks fairly pale however. CV:  Good peripheral perfusion.  Heart regular rate and rhythm no audible murmurs Resp:  Normal effort.  Lungs with scattered crackles throughout Abd:  No distention.  Soft and nontender Stool grape jelly stool is present.  It is obviously bloody.   ED Results / Procedures / Treatments   Labs (all labs ordered are listed, but only abnormal results are displayed) Labs Reviewed  COMPREHENSIVE METABOLIC PANEL - Abnormal; Notable for the following components:      Result Value   Sodium 133 (*)    Potassium 6.7 (*)    Chloride 96 (*)    Glucose, Bld  210 (*)    BUN 86 (*)    Creatinine, Ser 7.92 (*)    Albumin 3.0 (*)    Alkaline Phosphatase 221 (*)    GFR, Estimated 7 (*)    All other components within normal limits  CBC - Abnormal; Notable for the following components:   RBC 1.50 (*)    Hemoglobin 5.0 (*)    HCT 15.4 (*)    MCV 102.7 (*)    RDW 17.9 (*)    Platelets 111 (*)    All other components within normal limits  BRAIN NATRIURETIC PEPTIDE - Abnormal; Notable for the following components:   B Natriuretic Peptide 4,447.3 (*)    All other components within normal limits  CBC WITH DIFFERENTIAL/PLATELET - Abnormal; Notable for the following components:   RBC 1.52 (*)    Hemoglobin 5.0 (*)    HCT 15.6 (*)    MCV 102.6 (*)    RDW 18.1 (*)    Platelets 106 (*)  Lymphs Abs 0.6 (*)    All other components within normal limits  TROPONIN I (HIGH SENSITIVITY) - Abnormal; Notable for the following components:   Troponin I (High Sensitivity) 51 (*)    All other components within normal limits  LIPASE, BLOOD  PROCALCITONIN  HEPATITIS B SURFACE ANTIGEN  HEPATITIS B SURFACE ANTIBODY,QUALITATIVE  HEPATITIS B SURFACE ANTIBODY, QUANTITATIVE  HEPATITIS B CORE ANTIBODY, TOTAL  HEPATITIS C ANTIBODY  TYPE AND SCREEN  TYPE AND SCREEN  PREPARE RBC (CROSSMATCH)  ABO/RH     EKG  EKG read and interpreted by me shows normal sinus rhythm rate of 83 left axis no obvious acute ST-T wave changes there are some high peaked T waves in V2 and V3 only.   RADIOLOGY Chest x-ray shows a very enlarged heart and bilateral fluffy infiltrates consistent with CHF   PROCEDURES:  Critical Care performed: Critical care time 1 hour.  This includes talking to the patient getting consent for blood transfusion talking to the patient's wife answering multiple questions from the patient and talking to the renal doctor and the ICU person who the renal doctor asked me to call, and gastroenterology the ICU asked me to call.  I also then called the  hospitalist who wondered why the patient was not going to the ICU and I had to explain.  Procedures   MEDICATIONS ORDERED IN ED: Medications  0.9 %  sodium chloride infusion (has no administration in time range)  pantoprazole (PROTONIX) injection 40 mg (has no administration in time range)  pantoprozole (PROTONIX) 80 mg /NS 100 mL infusion (8 mg/hr Intravenous New Bag/Given 07/19/22 2239)  Chlorhexidine Gluconate Cloth 2 % PADS 6 each (has no administration in time range)  pentafluoroprop-tetrafluoroeth (GEBAUERS) aerosol 1 Application (has no administration in time range)  lidocaine (PF) (XYLOCAINE) 1 % injection 5 mL (has no administration in time range)  lidocaine-prilocaine (EMLA) cream 1 Application (has no administration in time range)  heparin injection 1,000 Units (has no administration in time range)  anticoagulant sodium citrate solution 5 mL (has no administration in time range)  alteplase (CATHFLO ACTIVASE) injection 2 mg (has no administration in time range)  sodium bicarbonate injection 50 mEq (50 mEq Intravenous Given 07/19/22 2115)  dextrose 50 % solution 50 mL (50 mLs Intravenous Given 07/19/22 2048)  insulin aspart (novoLOG) injection 5 Units (5 Units Intravenous Given 07/19/22 2049)  pantoprazole (PROTONIX) 80 mg /NS 100 mL IVPB (0 mg Intravenous Stopped 07/19/22 2109)  ondansetron (ZOFRAN) injection 4 mg (4 mg Intravenous Given 07/19/22 2048)  sodium chloride 0.9 % bolus 1,000 mL (0 mLs Intravenous Stopped 07/19/22 2145)     IMPRESSION / MDM / ASSESSMENT AND PLAN / ED COURSE  I reviewed the triage vital signs and the nursing notes. Discussed patient with Dr. Candiss Norse  Differential diagnosis includes, but is not limited to, GI bleed causing anemia and hyperkalemia lack of dialysis which patient reports is not true.  Upper GI bleed lower GI bleed are all in the differential.  Patient's presentation is most consistent with acute presentation with potential threat to life or  bodily function.  The patient is on the cardiac monitor to evaluate for evidence of arrhythmia and/or significant heart rate changes.  None were seen I should add that review of the patient's old records showed his blood pressure on several occasions was in the 18 0-200 range.  This would make him relatively hypotensive.  I discussed this with everyone I spoke to.  I also arranged treatment for  the patient's hyperkalemia.   FINAL CLINICAL IMPRESSION(S) / ED DIAGNOSES   Final diagnoses:  Diarrhea, unspecified type  Nausea and vomiting, unspecified vomiting type  Coffee ground emesis  Hematochezia  Hyperkalemia     Rx / DC Orders   ED Discharge Orders     None        Note:  This document was prepared using Dragon voice recognition software and may include unintentional dictation errors.   Nena Polio, MD 07/20/22 0001

## 2022-07-20 ENCOUNTER — Encounter: Payer: Self-pay | Admitting: Internal Medicine

## 2022-07-20 ENCOUNTER — Inpatient Hospital Stay: Payer: Medicaid Other | Admitting: Anesthesiology

## 2022-07-20 ENCOUNTER — Encounter
Admission: EM | Disposition: A | Discharge status: Home / Self Care | Payer: Self-pay | Source: Home / Self Care | Attending: Internal Medicine

## 2022-07-20 DIAGNOSIS — D62 Acute posthemorrhagic anemia: Secondary | ICD-10-CM

## 2022-07-20 HISTORY — PX: ESOPHAGOGASTRODUODENOSCOPY (EGD) WITH PROPOFOL: SHX5813

## 2022-07-20 LAB — HEPATITIS B SURFACE ANTIGEN: Hepatitis B Surface Ag: NONREACTIVE

## 2022-07-20 LAB — CBC
HCT: 13.8 % — CL (ref 39.0–52.0)
HCT: 19.4 % — ABNORMAL LOW (ref 39.0–52.0)
HCT: 22 % — ABNORMAL LOW (ref 39.0–52.0)
HCT: 22 % — ABNORMAL LOW (ref 39.0–52.0)
Hemoglobin: 4.7 g/dL — CL (ref 13.0–17.0)
Hemoglobin: 6.5 g/dL — ABNORMAL LOW (ref 13.0–17.0)
Hemoglobin: 7.5 g/dL — ABNORMAL LOW (ref 13.0–17.0)
Hemoglobin: 7.5 g/dL — ABNORMAL LOW (ref 13.0–17.0)
MCH: 33.3 pg (ref 26.0–34.0)
MCH: 31.6 pg (ref 26.0–34.0)
MCH: 31.5 pg (ref 26.0–34.0)
MCH: 31.5 pg (ref 26.0–34.0)
MCHC: 34.1 g/dL (ref 30.0–36.0)
MCHC: 33.5 g/dL (ref 30.0–36.0)
MCHC: 34.1 g/dL (ref 30.0–36.0)
MCHC: 34.1 g/dL (ref 30.0–36.0)
MCV: 97.9 fL (ref 80.0–100.0)
MCV: 94.2 fL (ref 80.0–100.0)
MCV: 92.4 fL (ref 80.0–100.0)
MCV: 92.4 fL (ref 80.0–100.0)
Platelets: 97 10*3/uL — ABNORMAL LOW (ref 150–400)
Platelets: 98 10*3/uL — ABNORMAL LOW (ref 150–400)
Platelets: 93 10*3/uL — ABNORMAL LOW (ref 150–400)
Platelets: 85 10*3/uL — ABNORMAL LOW (ref 150–400)
RBC: 1.41 MIL/uL — ABNORMAL LOW (ref 4.22–5.81)
RBC: 2.06 MIL/uL — ABNORMAL LOW (ref 4.22–5.81)
RBC: 2.38 MIL/uL — ABNORMAL LOW (ref 4.22–5.81)
RBC: 2.38 MIL/uL — ABNORMAL LOW (ref 4.22–5.81)
RDW: 17.6 % — ABNORMAL HIGH (ref 11.5–15.5)
RDW: 18.6 % — ABNORMAL HIGH (ref 11.5–15.5)
RDW: 19.3 % — ABNORMAL HIGH (ref 11.5–15.5)
RDW: 19.5 % — ABNORMAL HIGH (ref 11.5–15.5)
WBC: 5.9 10*3/uL (ref 4.0–10.5)
WBC: 6.1 10*3/uL (ref 4.0–10.5)
WBC: 4.9 10*3/uL (ref 4.0–10.5)
WBC: 4.4 10*3/uL (ref 4.0–10.5)
nRBC: 0 % (ref 0.0–0.2)
nRBC: 0 % (ref 0.0–0.2)
nRBC: 0 % (ref 0.0–0.2)
nRBC: 0 % (ref 0.0–0.2)

## 2022-07-20 LAB — COMPREHENSIVE METABOLIC PANEL
ALT: 29 U/L (ref 0–44)
AST: 23 U/L (ref 15–41)
Albumin: 2.7 g/dL — ABNORMAL LOW (ref 3.5–5.0)
Alkaline Phosphatase: 174 U/L — ABNORMAL HIGH (ref 38–126)
Anion gap: 10 (ref 5–15)
BUN: 53 mg/dL — ABNORMAL HIGH (ref 6–20)
CO2: 28 mmol/L (ref 22–32)
Calcium: 8.3 mg/dL — ABNORMAL LOW (ref 8.9–10.3)
Chloride: 98 mmol/L (ref 98–111)
Creatinine, Ser: 4.04 mg/dL — ABNORMAL HIGH (ref 0.61–1.24)
GFR, Estimated: 16 mL/min — ABNORMAL LOW (ref >60)
Glucose, Bld: 93 mg/dL (ref 70–99)
Potassium: 3.7 mmol/L (ref 3.5–5.1)
Sodium: 136 mmol/L (ref 135–145)
Total Bilirubin: 1 mg/dL (ref 0.3–1.2)
Total Protein: 6 g/dL — ABNORMAL LOW (ref 6.5–8.1)

## 2022-07-20 LAB — GLUCOSE, CAPILLARY
Glucose-Capillary: 94 mg/dL (ref 70–99)
Glucose-Capillary: 94 mg/dL (ref 70–99)
Glucose-Capillary: 110 mg/dL — ABNORMAL HIGH (ref 70–99)
Glucose-Capillary: 139 mg/dL — ABNORMAL HIGH (ref 70–99)

## 2022-07-20 LAB — PROCALCITONIN: Procalcitonin: 1.79 ng/mL

## 2022-07-20 LAB — HEPATITIS B CORE ANTIBODY, TOTAL: Hep B Core Total Ab: NONREACTIVE

## 2022-07-20 LAB — HIV ANTIBODY (ROUTINE TESTING W REFLEX): HIV Screen 4th Generation wRfx: NONREACTIVE

## 2022-07-20 LAB — ABO/RH: ABO/RH(D): O POS

## 2022-07-20 LAB — POTASSIUM: Potassium: 3.6 mmol/L (ref 3.5–5.1)

## 2022-07-20 LAB — HEPATITIS C ANTIBODY: HCV Ab: NONREACTIVE

## 2022-07-20 LAB — PREPARE RBC (CROSSMATCH)

## 2022-07-20 LAB — HEPATITIS B SURFACE ANTIBODY,QUALITATIVE: Hep B S Ab: REACTIVE — AB

## 2022-07-20 SURGERY — ESOPHAGOGASTRODUODENOSCOPY (EGD) WITH PROPOFOL
Anesthesia: General

## 2022-07-20 MED ORDER — ORAL CARE MOUTH RINSE: 15.0000 mL | OROMUCOSAL | Status: DC | PRN

## 2022-07-20 MED ORDER — PANTOPRAZOLE SODIUM 40 MG IV SOLR: 40.0000 mg | Freq: Two times a day (BID) | INTRAVENOUS | Status: DC

## 2022-07-20 MED ORDER — SODIUM CHLORIDE 0.9% IV SOLUTION: Freq: Once | INTRAVENOUS | Status: AC

## 2022-07-20 MED ORDER — INSULIN ASPART 100 UNIT/ML IJ SOLN: 0.0000 [IU] | Freq: Every day | INTRAMUSCULAR | Status: DC

## 2022-07-20 MED ORDER — PROPOFOL 10 MG/ML IV BOLUS
INTRAVENOUS | Status: DC | PRN
  Administered 2022-07-20: 50 mg via INTRAVENOUS

## 2022-07-20 MED ORDER — PANTOPRAZOLE INFUSION (NEW) - SIMPLE MED: 8.0000 mg/h | INTRAVENOUS | Status: DC

## 2022-07-20 MED ORDER — ACETAMINOPHEN 325 MG PO TABS
650.0000 mg | ORAL_TABLET | Freq: Four times a day (QID) | ORAL | Status: DC | PRN
  Administered 2022-07-20 – 2022-07-23 (×2): 650 mg via ORAL
  Filled 2022-07-20 (×2): qty 2

## 2022-07-20 MED ORDER — PANTOPRAZOLE 80MG IVPB - SIMPLE MED: 80.0000 mg | Freq: Once | INTRAVENOUS | Status: DC

## 2022-07-20 MED ORDER — PROPOFOL 500 MG/50ML IV EMUL
INTRAVENOUS | Status: DC | PRN
  Administered 2022-07-20: 100 ug/kg/min via INTRAVENOUS

## 2022-07-20 MED ORDER — INSULIN ASPART 100 UNIT/ML IJ SOLN: 0.0000 [IU] | Freq: Three times a day (TID) | INTRAMUSCULAR | Status: DC

## 2022-07-20 MED ORDER — LIDOCAINE HCL (CARDIAC) PF 100 MG/5ML IV SOSY
PREFILLED_SYRINGE | INTRAVENOUS | Status: DC | PRN
  Administered 2022-07-20: 100 mg via INTRAVENOUS

## 2022-07-20 MED ORDER — PROPOFOL 1000 MG/100ML IV EMUL
INTRAVENOUS | Status: AC
  Filled 2022-07-20: qty 100

## 2022-07-20 NOTE — Assessment & Plan Note (Signed)
BP's stable despite GI bleed.  Soft DBP's. Hold antihypertensives for now. Maintain MAP>65

## 2022-07-20 NOTE — Progress Notes (Signed)
[facility]  ROUNDING NOTE   Subjective:   Jerry [name] is a 56 y.o. male with past medical history of essential hypertension, diabetes, and end stage renal disease on hemodialysis. Patient presented to the emergency department complaining of cough and diarrhea. Patient has been admitted for Hematochezia [K92.1] Hyperkalemia [E87.5] GI bleed [K92.2] Coffee ground emesis [K92.0] Diarrhea, unspecified type [R19.7] Nausea and vomiting, unspecified vomiting type [R11.2]  Patient is known to our practice and receives outpatient dialysis treatments at [facility] on a MWF schedule, supervised by Dr. Candiss Norse.  Last treatment received on Wednesday.  Patient seen and evaluated at bedside in ICU.  Spanish-speaking only, video interpreter used.  Patient denies any recent missed dialysis treatments.  Does report progressive weakness over 2 to 3 days.  Diarrhea started yesterday.  Does endorse poor appetite with nausea however no vomiting.  Chart review states patient vomited coffee-ground emesis on ED arrival.  Denies NSAID use.  Denies chest pain.  Denies known fever or chills.  Labs on ED arrival include sodium 133, potassium 6.7, glucose 210, BUN 86, and creatinine 7.92 with GFR 7.  BNP greater than 4400, and hemoglobin 5.0.  Chest x-ray shows mild to moderate interstitial lung markings with superimposed areas of mid and lower lung atelectasis and/or infiltrate. Dialysis performed to correct potassium   We have been consulted to manage dialysis needs during this admission.   Objective:  Vital signs in last 24 hours:  Temp:  [97.9 F (36.6 C)-98.9 F (37.2 C)] 98.6 F (37 C) (10/06 1018) Pulse Rate:  [73-89] 79 (10/06 1024) Resp:  [8-20] 18 (10/06 1024) BP: (116-144)/(38-63) 137/63 (10/06 1024) SpO2:  [93 %-100 %] 98 % (10/06 1024) Weight:  [76.2 kg-78 kg] 76.2 kg (10/06 0450)  Weight change:  Filed Weights   07/20/22 0050 07/20/22 0420 07/20/22 0450  Weight: 78 kg  77 kg 76.2 kg    Intake/Output: I/O last 3 completed shifts: In: -  Out: 1000 [Other:1000]   Intake/Output this shift:  Total I/O In: 354.3 [Blood:354.3] Out: -   Physical Exam: General: Ill-appearing  Head: Normocephalic, atraumatic. Moist oral mucosal membranes  Eyes: Anicteric  Lungs:  Clear to auscultation, normal effort  Heart: Regular rate and rhythm  Abdomen:  Soft, nontender  Extremities: Trace peripheral edema.  Neurologic: Nonfocal, moving all four extremities  Skin: No lesions  Access: Left aVF    Basic Metabolic Panel: Recent Labs  Lab 07/19/22 1919 07/20/22 0240 07/20/22 0601  NA 133*  --  136  K 6.7* 3.6 3.7  CL 96*  --  98  CO2 24  --  28  GLUCOSE 210*  --  93  BUN 86*  --  53*  CREATININE 7.92*  --  4.04*  CALCIUM 8.9  --  8.3*    Liver Function Tests: Recent Labs  Lab 07/19/22 1919 07/20/22 0601  AST 31 23  ALT 33 29  ALKPHOS 221* 174*  BILITOT 1.0 1.0  PROT 6.8 6.0*  ALBUMIN 3.0* 2.7*   Recent Labs  Lab 07/19/22 1919  LIPASE 29   No results for input(s): "AMMONIA" in the last 168 hours.  CBC: Recent Labs  Lab 07/19/22 1919 07/20/22 0601  WBC 5.3  5.3 5.9  NEUTROABS 4.3  --   HGB 5.0*  5.0* 4.7*  HCT 15.6*  15.4* 13.8*  MCV 102.6*  102.7* 97.9  PLT 106*  111* 97*    Cardiac Enzymes: No results for input(s): "CKTOTAL", "CKMB", "CKMBINDEX", "TROPONINI" in  the last 168 hours.  BNP: Invalid input(s): "POCBNP"  CBG: Recent Labs  Lab 07/20/22 0436 07/20/22 0808  GLUCAP 94 94    Microbiology: Results for orders placed or performed during the hospital encounter of 04/11/21  Resp Panel by RT-PCR (Flu A&B, Covid) Nasopharyngeal Swab     Status: None   Collection Time: 04/11/21  6:03 PM   Specimen: Nasopharyngeal Swab; Nasopharyngeal(NP) swabs in vial transport medium  Result Value Ref Range Status   SARS Coronavirus 2 by RT PCR NEGATIVE NEGATIVE Final    Comment: (NOTE) SARS-CoV-2 target nucleic acids are NOT  DETECTED.  The SARS-CoV-2 RNA is generally detectable in upper respiratory specimens during the acute phase of infection. The lowest concentration of SARS-CoV-2 viral copies this assay can detect is 138 copies/mL. A negative result does not preclude SARS-Cov-2 infection and should not be used as the sole basis for treatment or other patient management decisions. A negative result may occur with  improper specimen collection/handling, submission of specimen other than nasopharyngeal swab, presence of viral mutation(s) within the areas targeted by this assay, and inadequate number of viral copies(<138 copies/mL). A negative result must be combined with clinical observations, patient history, and epidemiological information. The expected result is Negative.  Fact Sheet for Patients:  EntrepreneurPulse.com.au  Fact Sheet for Healthcare Providers:  IncredibleEmployment.be  This test is no t yet approved or cleared by the [place] FDA and  has been authorized for detection and/or diagnosis of SARS-CoV-2 by FDA under an Emergency Use Authorization (EUA). This EUA will remain  in effect (meaning this test can be used) for the duration of the COVID-19 declaration under Section 564(b)(1) of the Act, 21 U.S.C.section 360bbb-3(b)(1), unless the authorization is terminated  or revoked sooner.       Influenza A by PCR NEGATIVE NEGATIVE Final   Influenza B by PCR NEGATIVE NEGATIVE Final    Comment: (NOTE) The Xpert Xpress SARS-CoV-2/FLU/RSV plus assay is intended as an aid in the diagnosis of influenza from Nasopharyngeal swab specimens and should not be used as a sole basis for treatment. Nasal washings and aspirates are unacceptable for Xpert Xpress SARS-CoV-2/FLU/RSV testing.  Fact Sheet for Patients: EntrepreneurPulse.com.au  Fact Sheet for Healthcare Providers: IncredibleEmployment.be  This test is not yet  approved or cleared by the [place] FDA and has been authorized for detection and/or diagnosis of SARS-CoV-2 by FDA under an Emergency Use Authorization (EUA). This EUA will remain in effect (meaning this test can be used) for the duration of the COVID-19 declaration under Section 564(b)(1) of the Act, 21 U.S.C. section 360bbb-3(b)(1), unless the authorization is terminated or revoked.  Performed at [facility], [place], [place] [address]   C Difficile Quick Screen w PCR reflex     Status: None   Collection Time: 04/12/21  2:16 AM   Specimen: STOOL  Result Value Ref Range Status   C Diff antigen NEGATIVE NEGATIVE Final   C Diff toxin NEGATIVE NEGATIVE Final   C Diff interpretation No C. difficile detected.  Final    Comment: Performed at [facility], [address] [place], [place] [address]  Gastrointestinal Panel by PCR , Stool     Status: None   Collection Time: 04/12/21  2:16 AM   Specimen: Stool  Result Value Ref Range Status   Campylobacter species NOT DETECTED NOT DETECTED Final   Plesimonas shigelloides NOT DETECTED NOT DETECTED Final   Salmonella species NOT DETECTED NOT DETECTED Final  Yersinia enterocolitica NOT DETECTED NOT DETECTED Final   Vibrio species NOT DETECTED NOT DETECTED Final   Vibrio cholerae NOT DETECTED NOT DETECTED Final   Enteroaggregative E coli (EAEC) NOT DETECTED NOT DETECTED Final   Enteropathogenic E coli (EPEC) NOT DETECTED NOT DETECTED Final   Enterotoxigenic E coli (ETEC) NOT DETECTED NOT DETECTED Final   Shiga like toxin producing E coli (STEC) NOT DETECTED NOT DETECTED Final   Shigella/Enteroinvasive E coli (EIEC) NOT DETECTED NOT DETECTED Final   Cryptosporidium NOT DETECTED NOT DETECTED Final   Cyclospora cayetanensis NOT DETECTED NOT DETECTED Final   Entamoeba histolytica NOT DETECTED NOT DETECTED Final   Giardia lamblia NOT DETECTED NOT DETECTED Final   Adenovirus F40/41 NOT DETECTED NOT  DETECTED Final   Astrovirus NOT DETECTED NOT DETECTED Final   Norovirus GI/GII NOT DETECTED NOT DETECTED Final   Rotavirus A NOT DETECTED NOT DETECTED Final   Sapovirus (I, II, IV, and V) NOT DETECTED NOT DETECTED Final    Comment: Performed at [facility], [address] [place], [place] [address]    Coagulation Studies: No results for input(s): "LABPROT", "INR" in the last 72 hours.  Urinalysis: No results for input(s): "COLORURINE", "LABSPEC", "PHURINE", "GLUCOSEU", "HGBUR", "BILIRUBINUR", "KETONESUR", "PROTEINUR", "UROBILINOGEN", "NITRITE", "LEUKOCYTESUR" in the last 72 hours.  Invalid input(s): "APPERANCEUR"    Imaging: DG Chest 2 View  Result Date: 07/19/2022 CLINICAL DATA:  Cough and diarrhea. EXAM: CHEST - 2 VIEW COMPARISON:  April 11, 2021 FINDINGS: The cardiac silhouette is moderately enlarged and unchanged in size. There is mild to moderate severity calcification of the aortic arch. Mild to moderate severity diffusely increased interstitial lung markings are seen. Mild superimposed areas of atelectasis and/or infiltrate are noted along the periphery of the mid and lower lung fields, bilaterally. There is no evidence of a pleural effusion or pneumothorax. The visualized skeletal structures are unremarkable. IMPRESSION: 1. Cardiomegaly with mild to moderate severity increased interstitial lung markings. 2. Mild superimposed areas of mid and lower lung field atelectasis and/or infiltrate. Electronically Signed   By: Virgina Norfolk M.D.   On: 07/19/2022 19:52     Medications:    sodium chloride     pantoprazole 8 mg/hr (07/20/22 6010)    Chlorhexidine Gluconate Cloth  6 each Topical Q0600   insulin aspart  0-5 Units Subcutaneous QHS   insulin aspart  0-6 Units Subcutaneous TID WC   [START ON 07/23/2022] pantoprazole  40 mg Intravenous Q12H   mouth rinse  Assessment/ Plan:  Jerry [name] is a 56 y.o.  male with past medical history of essential  hypertension, diabetes, and end stage renal disease on hemodialysis. Patient presented to the emergency department complaining of cough and diarrhea. Patient has been admitted for Hematochezia [K92.1] Hyperkalemia [E87.5] GI bleed [K92.2] Coffee ground emesis [K92.0] Diarrhea, unspecified type [R19.7] Nausea and vomiting, unspecified vomiting type [R11.2]  [facility]/MWF/left aVF/78.5 kg  Hyperkalemia with end-stage renal disease on hemodialysis.  Will maintain outpatient schedule if possible.  Patient received urgent dialysis treatment due to elevated potassium on admission, 6.7.  Corrected potassium 3.7 today.  Next dialysis treatment scheduled for Saturday.  Hepatitis B panel ordered per hospital policy.  2. Anemia of chronic kidney disease with acute blood loss: hemoglobin 8.5.  Lab Results  Component Value Date   HGB 4.7 (LL) 07/20/2022  Coffee-ground emesis and melena noted on ED arrival.  GI consulted with EGD planned after transfusions.  2 unit blood transfusion ordered by primary team.  3. Secondary Hyperparathyroidism: with outpatient labs: PTH 65, phosphorus 5.4, calcium 9.9 on 05/22/22.   Lab Results  Component Value Date   CALCIUM 8.3 (L) 07/20/2022   PHOS 3.6 04/13/2021    Calcium within acceptable range.  Patient prescribed calcium carbonate and sevelamer outpatient.  Currently held  4.  Hypertension with chronic kidney disease.  Home regimen includes hydralazine, currently held.  Blood pressure currently 146/59.   LOS: 1   10/6/202311:23 AM

## 2022-07-20 NOTE — Assessment & Plan Note (Addendum)
Mild, Na 133 on admission, normalized. Recurrent, likely related to volume status, given ESRD, suspect intermittent hypervolemic hyponatremia.  Monitor BMP

## 2022-07-20 NOTE — Progress Notes (Signed)
Lab pending.

## 2022-07-20 NOTE — Consult Note (Addendum)
  GI Inpatient Consult Note  Reason for Consult: Coffee ground emesis and anemia, UGI bleed   Attending Requesting Consult: Dr. Paul Malinda  History of Present Illness: Jerry [name] is a 56 y.o. male seen for evaluation of coffee ground emesis, grape jelly colored stool and acute on chronic anemia is at the request of Dr. Malinda. Patient has a PMH of diabetes mellitus, end-stage renal disease on HD, hypertension.  Patient presented to the [facility] ED for chief complaint of diarrhea onset 2 days ago.  He was dialyzed 2 days ago and later developed frequent diarrhea.  He had vomited once at home and was feeling overall weak, tired, and had trouble walking.He presented to the ED last evening.  He vomited coffee-ground emesis in the ED. He is not on anticoagulation.  Upon presentation to the ED, vital signs were 144/55, 88, 20, 98.7, SPO2 95%. Labs were significant for hemoglobin 5-4.7  down from baseline 9, platelets 106 . He was ordered 2 unit PRBC. Sodium 133, potassium 6.7, BUN 86 creatinine 7.9, albumin 3.0-2.7, alk phos 174, ALT 29, T bili 1.0, Troponin 51, BNP 4447. Hepatitis panel pending.  Imaging studies revealed CXR: IMPRESSION: 1. Cardiomegaly with mild to moderate severity increased interstitial lung markings. 2. Mild superimposed areas of mid and lower lung field atelectasis and/or infiltrate.   Patient's primary language is Spanish, therefore 100% of the history is directly interpreted by language line on a stick- [name] [id number]. Information also obtained by wife who is at bedside.    The patient reports 1 week ago he felt some weakness in his legs.  He developed onset of nausea 2 days ago. He took two doses of Pepto Bismol. He had onset of diarrhea last night and did not look at the color. He had first episode of vomiting last night in the emergency room. He vomited multiple more times in the waiting room. Staff noted grape jelly colored stool to possible maroon. No further diarrhea  and vomiting last night. He has had no abdominal pain, cramps or bloating.  Took Pepto-Bismol twice when nausea started.  He had no abdominal pain, heartburn, reflux or dysphagia.  He does take Tums with meals as per recommendation from his dialysis team.  He has never had any upper GI complaints or heartburn or history of ulcer.  No previous EGD or colonoscopy studies. No ill contacts or  suspicious food consumed.  No medication changes. Reports normal bowel movements and no stomach concerns prior to this event.   Family history is negative for colon cancer colon polyps or GI malignancy. Patient quit all alcohol over 10 years ago when he began hemodialysis.  He denies any tobacco products.  He denies any NSAID use.  For knee pain he takes tramadol, and Tylenol.  Last Colonoscopy: none Last Endoscopy: none  Past Medical History:  Past Medical History:  Diagnosis Date   Diabetes mellitus (HCC)    ESRD on hemodialysis (HCC)    Hypertension    Renal disorder     Problem List: Patient Active Problem List   Diagnosis Date Noted   Hyperkalemia 07/19/2022   GI bleed 07/19/2022   ESRD on hemodialysis (HCC) 04/11/2021   Abdominal pain 04/11/2021   Hyponatremia 04/11/2021   Diabetes mellitus (HCC) 07/21/2013   Hypertension 02/18/2013    Past Surgical History: Past Surgical History:  Procedure Laterality Date   AV FISTULA PLACEMENT Left    FOOT SURGERY      Allergies: No Known Allergies    Home Medications: Medications Prior to Admission  Medication Sig Dispense Refill Last Dose   calcium carbonate (TUMS EX) 750 MG chewable tablet Chew by mouth 3 (three) times daily. With meals      gabapentin (NEURONTIN) 300 MG capsule Take 1 capsule (300 mg total) by mouth every Monday, Wednesday, and Friday at 6 PM. After dialysis 30 capsule 0    hydrALAZINE (APRESOLINE) 25 MG tablet Take 1 tablet (25 mg total) by mouth 3 (three) times daily. 90 tablet 1    polyethylene glycol (MIRALAX) 17 g packet  Take 17 g by mouth daily. 14 each 0    RENVELA 800 MG tablet Take 1,600 mg by mouth 2 (two) times daily. After meals      traMADol (ULTRAM) 50 MG tablet Take 100 mg by mouth every 12 (twelve) hours as needed. In the morning and evening.      Home medication reconciliation was completed with the patient.   Scheduled Inpatient Medications:    Chlorhexidine Gluconate Cloth  6 each Topical Q0600   insulin aspart  0-5 Units Subcutaneous QHS   insulin aspart  0-6 Units Subcutaneous TID WC   [START ON 07/23/2022] pantoprazole  40 mg Intravenous Q12H    Continuous Inpatient Infusions:    sodium chloride     pantoprazole 8 mg/hr (07/20/22 0638)    PRN Inpatient Medications:    Family History: family history includes Glaucoma in his mother; Kidney failure in his father and mother.  The patient's family history is negative for inflammatory bowel disorders, GI malignancy, or solid organ transplantation.  Social History:   reports that he has never smoked. He has never used smokeless tobacco. He reports current alcohol use. He reports that he does not use drugs.   Review of Systems: Constitutional: Weight is stable.  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  ENT: No oral lesions, sore throat.  GI: see HPI.  Heme/Lymph: No easy bruising.  CV: No chest pain.  GU: No hematuria.  Integumentary: He had bites (not hives)  on face, head, back and itchy skin- 1-2 weeks PTA- resolved  Neuro: No headaches.  Psych: No depression/anxiety.  Endocrine: No heat/cold intolerance.  Allergic/Immunologic: No urticaria.  Resp: He had a mild cough- prior to arrival- resolved overnight. No SOB.  Musculoskeletal: Chronic knee pain- stable   Physical Examination: BP (!) 121/54   Pulse 78   Temp 97.9 F (36.6 C) (Axillary)   Resp 20   Ht 5' 6" (1.676 m)   Wt 76.2 kg   SpO2 96%   BMI 27.11 kg/m  Gen: NAD, alert and oriented x 4, Pale HEENT: EOMI, pale conjunctiva, no icterus.  Neck:  supple, no JVD or thyromegaly Chest: CTA bilaterally, no wheezes, crackles, or other adventitious sounds CV: RRR, positive murmur ABd: soft, NT, ND, +BS in all four quadrants; no HSM, guarding, rigidity, or rebound tenderness Ext: no edema Skin: no rash   Data: Lab Results  Component Value Date   WBC 5.9 07/20/2022   HGB 4.7 (LL) 07/20/2022   HCT 13.8 (LL) 07/20/2022   MCV 97.9 07/20/2022   PLT 97 (L) 07/20/2022   Recent Labs  Lab 07/19/22 1919 07/20/22 0601  HGB 5.0*  5.0* 4.7*   Lab Results  Component Value Date   NA 133 (L) 07/19/2022   K 3.6 07/20/2022   CL 96 (L) 07/19/2022   CO2 24 07/19/2022   BUN 86 (H) 07/19/2022   CREATININE 7.92 (H) 07/19/2022     Lab Results  Component Value Date   ALT 33 07/19/2022   AST 31 07/19/2022   ALKPHOS 221 (H) 07/19/2022   BILITOT 1.0 07/19/2022   No results for input(s): "APTT", "INR", "PTT" in the last 168 hours. Assessment/Plan: Jerry [name] is a 56 y.o. male seen for evaluation of coffee ground emesis, diarrhea with grape jelly colored stool and acute on chronic anemia and admitted to ICU for  UGI bleed. He had Pepto Bismol use prior to arrival for nausea. No prior hx of GERD, UGI bleed or NSAID use. No anticoagulation. Negative alcohol use. No prior csy or EGD studies. FH negative for GI malignancy. He has diabetes mellitus, blindness, end-stage renal disease on HD, hypertension. He had elevated BNP, cough mild CHF on CXR- now resolved.  At the time of evaluation, he has had no further episodes of diarrhea or vomiting.  He has acute blood loss anemia. He is awaiting of first unit blood transfusion for hemoglobin 5.  He is hemodynamically stable.  Patient appears weak, pale, but endorses no abdominal pain or tenderness on exam. He denies chest pain or shortness of breath.  - DDx includes acute viral gastroenteritis,  duodenitis, gastritis, PUD, SIBO, gastroparesis, H pylori, electrolyte derangement.   If upper endoscopy is  negative, then will need to consider colonoscopy for further evaluation for right colon or lower GI bleed as well.  He reported acute episode of diarrhea.  This may be secondary to upper GI bleed.  He has had no further diarrhea during admission.  He has no abdominal pain or tenderness to consider ischemia.  No history of diverticulitis.  Considered diverticulosis.  No prior history of colonoscopy and therefore need to consider colon malignancy in differential.  Recommendations: Esophagogastroduodenoscopy planned for today pending patient stability and endoscopy suite availability NPO at now. Labs reveiwed.  Pt receiving additional prbc unit in endoscopy pre-op Protonix 40 mg iv q12 h Hold dvt ppx Monitor H&H.  Transfusion and resuscitation as per primary team Avoid frequent lab draws to prevent lab induced anemia Supportive care and antiemetics as per primary team Maintain two sites IV access Avoid nsaids Monitor for GIB.  Further recommendations pending endoscopy. Please see op report for further details  Stool studies to be done if diarrheas persists.  He will need eventual colonoscopy   Esophagogastroduodenoscopy with possible biopsy, control of bleeding, polypectomy, and interventions as necessary has been discussed with the patient/patient representative. Informed consent was obtained from the patient/patient representative after explaining the indication, nature, and risks of the procedure including but not limited to death, bleeding, perforation, missed neoplasm/lesions, cardiorespiratory compromise, and reaction to medications. Opportunity for questions was given and appropriate answers were provided. Patient/patient representative has verbalized understanding is amenable to undergoing the procedure.    Further recommendations pending EGD. See op report for further details   Thank you for the consult. Please call with questions or concerns.  Denice Paradise, [facility]  Gastroenterology [phone]

## 2022-07-20 NOTE — Hospital Course (Signed)
Jerry [name] is a 56 y.o. male with medical history significant of end-stage renal disease on hemodialysis, diabetes, essential hypertension, who came in from home on evening of 07/19/2022 for evaluation of diarrhea (melena) since the day before as well as generalized weakness.  Patient started having vomiting in the ER which was coffee-ground emesis.  Admitted to the hospital with acute blood loss anemia with initial Hbg of 5.0, elevated potassium > 7.  Two units pRBC's were ordered for transfusion on admission.  Being hemodynamically stable, pt was admitted to hospitalist service, placed in stepdown unit for close monitoring.  Gastroenterology consulted for endoscopic evaluation.  Nephrology consulted to continue hemodialysis.

## 2022-07-20 NOTE — Anesthesia Postprocedure Evaluation (Signed)
Anesthesia Post Note  Patient: Jerry [name]  Procedure(s) Performed: ESOPHAGOGASTRODUODENOSCOPY (EGD) WITH PROPOFOL  Patient location during evaluation: PACU Anesthesia Type: General Level of consciousness: awake and alert, oriented and patient cooperative Pain management: pain level controlled Vital Signs Assessment: post-procedure vital signs reviewed and stable Respiratory status: spontaneous breathing, nonlabored ventilation and respiratory function stable Cardiovascular status: blood pressure returned to baseline and stable Postop Assessment: adequate PO intake Anesthetic complications: no   No notable events documented.   Last Vitals:  Vitals:   07/20/22 1640 07/20/22 1647  BP: (!) 119/49 (!) 127/52  Pulse: 74 75  Resp: 16 20  Temp:    SpO2: (!) 9%     Last Pain:  Vitals:   07/20/22 1620  TempSrc: Temporal  PainSc:                  Darrin Nipper

## 2022-07-20 NOTE — H&P (View-Only) (Signed)
GI Inpatient Consult Note  Reason for Consult: Coffee ground emesis and anemia, UGI bleed   Attending Requesting Consult: Dr. Conni Slipper  History of Present Illness: Jerry [name] is a 56 y.o. male seen for evaluation of coffee ground emesis, grape jelly colored stool and acute on chronic anemia is at the request of Dr. Cinda Quest. Patient has a PMH of diabetes mellitus, end-stage renal disease on HD, hypertension.  Patient presented to the [facility] ED for chief complaint of diarrhea onset 2 days ago.  He was dialyzed 2 days ago and later developed frequent diarrhea.  He had vomited once at home and was feeling overall weak, tired, and had trouble walking.He presented to the ED last evening.  He vomited coffee-ground emesis in the ED. He is not on anticoagulation.  Upon presentation to the ED, vital signs were 144/55, 88, 20, 98.7, SPO2 95%. Labs were significant for hemoglobin 5-4.7  down from baseline 9, platelets 106 . He was ordered 2 unit PRBC. Sodium 133, potassium 6.7, BUN 86 creatinine 7.9, albumin 3.0-2.7, alk phos 174, ALT 29, T bili 1.0, Troponin 51, BNP 4447. Hepatitis panel pending.  Imaging studies revealed CXR: IMPRESSION: 1. Cardiomegaly with mild to moderate severity increased interstitial lung markings. 2. Mild superimposed areas of mid and lower lung field atelectasis and/or infiltrate.   Patient's primary language is Spanish, therefore 100% of the history is directly interpreted by language line on a stick- [name] [phone]. Information also obtained by wife who is at bedside.    The patient reports 1 week ago he felt some weakness in his legs.  He developed onset of nausea 2 days ago. He took two doses of Pepto Bismol. He had onset of diarrhea last night and did not look at the color. He had first episode of vomiting last night in the emergency room. He vomited multiple more times in the waiting room. Staff noted grape jelly colored stool to possible maroon. No further diarrhea  and vomiting last night. He has had no abdominal pain, cramps or bloating.  Took Pepto-Bismol twice when nausea started.  He had no abdominal pain, heartburn, reflux or dysphagia.  He does take Tums with meals as per recommendation from his dialysis team.  He has never had any upper GI complaints or heartburn or history of ulcer.  No previous EGD or colonoscopy studies. No ill contacts or  suspicious food consumed.  No medication changes. Reports normal bowel movements and no stomach concerns prior to this event.   Family history is negative for colon cancer colon polyps or GI malignancy. Patient quit all alcohol over 10 years ago when he began hemodialysis.  He denies any tobacco products.  He denies any NSAID use.  For knee pain he takes tramadol, and Tylenol.  Last Colonoscopy: none Last Endoscopy: none  Past Medical History:  Past Medical History:  Diagnosis Date   Diabetes mellitus ([place])    ESRD on hemodialysis ([place])    Hypertension    Renal disorder     Problem List: Patient Active Problem List   Diagnosis Date Noted   Hyperkalemia 07/19/2022   GI bleed 07/19/2022   ESRD on hemodialysis ([place]) 04/11/2021   Abdominal pain 04/11/2021   Hyponatremia 04/11/2021   Diabetes mellitus ([place]) 07/21/2013   Hypertension 02/18/2013    Past Surgical History: Past Surgical History:  Procedure Laterality Date   AV FISTULA PLACEMENT Left    FOOT SURGERY      Allergies: No Known Allergies  Home Medications: Medications Prior to Admission  Medication Sig Dispense Refill Last Dose   calcium carbonate (TUMS EX) 750 MG chewable tablet Chew by mouth 3 (three) times daily. With meals      gabapentin (NEURONTIN) 300 MG capsule Take 1 capsule (300 mg total) by mouth every Monday, Wednesday, and Friday at 6 PM. After dialysis 30 capsule 0    hydrALAZINE (APRESOLINE) 25 MG tablet Take 1 tablet (25 mg total) by mouth 3 (three) times daily. 90 tablet 1    polyethylene glycol (MIRALAX) 17 g packet  Take 17 g by mouth daily. 14 each 0    RENVELA 800 MG tablet Take 1,600 mg by mouth 2 (two) times daily. After meals      traMADol (ULTRAM) 50 MG tablet Take 100 mg by mouth every 12 (twelve) hours as needed. In the morning and evening.      Home medication reconciliation was completed with the patient.   Scheduled Inpatient Medications:    Chlorhexidine Gluconate Cloth  6 each Topical Q0600   insulin aspart  0-5 Units Subcutaneous QHS   insulin aspart  0-6 Units Subcutaneous TID WC   [START ON 07/23/2022] pantoprazole  40 mg Intravenous Q12H    Continuous Inpatient Infusions:    sodium chloride     pantoprazole 8 mg/hr (07/20/22 5573)    PRN Inpatient Medications:    Family History: family history includes Glaucoma in his mother; Kidney failure in his father and mother.  The patient's family history is negative for inflammatory bowel disorders, GI malignancy, or solid organ transplantation.  Social History:   reports that he has never smoked. He has never used smokeless tobacco. He reports current alcohol use. He reports that he does not use drugs.   Review of Systems: Constitutional: Weight is stable.  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  ENT: No oral lesions, sore throat.  GI: see HPI.  Heme/Lymph: No easy bruising.  CV: No chest pain.  GU: No hematuria.  Integumentary: He had bites (not hives)  on face, head, back and itchy skin- 1-2 weeks PTA- resolved  Neuro: No headaches.  Psych: No depression/anxiety.  Endocrine: No heat/cold intolerance.  Allergic/Immunologic: No urticaria.  Resp: He had a mild cough- prior to arrival- resolved overnight. No SOB.  Musculoskeletal: Chronic knee pain- stable   Physical Examination: BP (!) 121/54   Pulse 78   Temp 97.9 F (36.6 C) (Axillary)   Resp 20   Ht 5' 6" (1.676 m)   Wt 76.2 kg   SpO2 96%   BMI 27.11 kg/m  Gen: NAD, alert and oriented x 4, Pale HEENT: EOMI, pale conjunctiva, no icterus.  Neck:  supple, no JVD or thyromegaly Chest: CTA bilaterally, no wheezes, crackles, or other adventitious sounds CV: RRR, positive murmur ABd: soft, NT, ND, +BS in all four quadrants; no HSM, guarding, rigidity, or rebound tenderness Ext: no edema Skin: no rash   Data: Lab Results  Component Value Date   WBC 5.9 07/20/2022   HGB 4.7 (LL) 07/20/2022   HCT 13.8 (LL) 07/20/2022   MCV 97.9 07/20/2022   PLT 97 (L) 07/20/2022   Recent Labs  Lab 07/19/22 1919 07/20/22 0601  HGB 5.0*  5.0* 4.7*   Lab Results  Component Value Date   NA 133 (L) 07/19/2022   K 3.6 07/20/2022   CL 96 (L) 07/19/2022   CO2 24 07/19/2022   BUN 86 (H) 07/19/2022   CREATININE 7.92 (H) 07/19/2022  Lab Results  Component Value Date   ALT 33 07/19/2022   AST 31 07/19/2022   ALKPHOS 221 (H) 07/19/2022   BILITOT 1.0 07/19/2022   No results for input(s): "APTT", "INR", "PTT" in the last 168 hours. Assessment/Plan: Mr. Jerry [name] is a 56 y.o. male seen for evaluation of coffee ground emesis, diarrhea with grape jelly colored stool and acute on chronic anemia and admitted to ICU for  UGI bleed. He had Pepto Bismol use prior to arrival for nausea. No prior hx of GERD, UGI bleed or NSAID use. No anticoagulation. Negative alcohol use. No prior csy or EGD studies. FH negative for GI malignancy. He has diabetes mellitus, blindness, end-stage renal disease on HD, hypertension. He had elevated BNP, cough mild CHF on CXR- now resolved.  At the time of evaluation, he has had no further episodes of diarrhea or vomiting.  He has acute blood loss anemia. He is awaiting of first unit blood transfusion for hemoglobin 5.  He is hemodynamically stable.  Patient appears weak, pale, but endorses no abdominal pain or tenderness on exam. He denies chest pain or shortness of breath.  - DDx includes acute viral gastroenteritis,  duodenitis, gastritis, PUD, SIBO, gastroparesis, H pylori, electrolyte derangement.   If upper endoscopy is  negative, then will need to consider colonoscopy for further evaluation for right colon or lower GI bleed as well.  He reported acute episode of diarrhea.  This may be secondary to upper GI bleed.  He has had no further diarrhea during admission.  He has no abdominal pain or tenderness to consider ischemia.  No history of diverticulitis.  Considered diverticulosis.  No prior history of colonoscopy and therefore need to consider colon malignancy in differential.  Recommendations: Esophagogastroduodenoscopy planned for today pending patient stability and endoscopy suite availability NPO at now. Labs reveiwed.  Pt receiving additional prbc unit in endoscopy pre-op Protonix 40 mg iv q12 h Hold dvt ppx Monitor H&H.  Transfusion and resuscitation as per primary team Avoid frequent lab draws to prevent lab induced anemia Supportive care and antiemetics as per primary team Maintain two sites IV access Avoid nsaids Monitor for GIB.  Further recommendations pending endoscopy. Please see op report for further details  Stool studies to be done if diarrheas persists.  He will need eventual colonoscopy   Esophagogastroduodenoscopy with possible biopsy, control of bleeding, polypectomy, and interventions as necessary has been discussed with the patient/patient representative. Informed consent was obtained from the patient/patient representative after explaining the indication, nature, and risks of the procedure including but not limited to death, bleeding, perforation, missed neoplasm/lesions, cardiorespiratory compromise, and reaction to medications. Opportunity for questions was given and appropriate answers were provided. Patient/patient representative has verbalized understanding is amenable to undergoing the procedure.    Further recommendations pending EGD. See op report for further details   Thank you for the consult. Please call with questions or concerns.  Denice Paradise, [facility]  Gastroenterology [phone]

## 2022-07-20 NOTE — Assessment & Plan Note (Addendum)
Hbg on admission 5.0>>4.7, due to GI bleeding with melena and coffee-ground emesis in the ED. status post transfusion of 3 units PRBCs on 10/6. Hemoglobin ...7.9 >> 7.8 >> 7.7 >> 8.1 --See GI Bleed

## 2022-07-20 NOTE — Assessment & Plan Note (Addendum)
Pt was having melena at home, in ED had coffee-ground emesis, severely anemic. --GI consulted --EGD today showed non-bleeding gastric ulcers with clean bases, gastritis --Diet advanced and pt tolerating, Hbg improving --No further melenotic stools --Follow up biopsy path --See ABLA --Treated with PPI drip >> IV PPI BID --D/c on Protonix 40 mg PO BID x 8 weeks --No NSAID's or ASA --Repeat EGD in 8 weeks --GI follow up as scheduled --Spanish info sheet regarding diet for peptic ulcer disease included in AVS and discussed in detail

## 2022-07-20 NOTE — Assessment & Plan Note (Addendum)
K was 6.7 on admission, in setting of ESRD and active upper GI bleed.  Treated in the ED with IV insulin +dextrose, improved to K 3.6. --Monitor BMP --Continue dialysis

## 2022-07-20 NOTE — Assessment & Plan Note (Addendum)
On sliding scale Novolog for now Resume home regimen at d/c

## 2022-07-20 NOTE — Anesthesia Preprocedure Evaluation (Addendum)
Anesthesia Evaluation  Patient identified by MRN, date of birth, ID band Patient awake    Reviewed: Allergy & Precautions, NPO status , Patient's Chart, lab work & pertinent test results  History of Anesthesia Complications Negative for: history of anesthetic complications  Airway Mallampati: IV   Neck ROM: full    Dental  (+) Partial Upper, Missing   Pulmonary neg pulmonary ROS,    Pulmonary exam normal        Cardiovascular hypertension, Normal cardiovascular exam     Neuro/Psych negative neurological ROS  negative psych ROS   GI/Hepatic negative GI ROS, Neg liver ROS,   Endo/Other  diabetes  Renal/GU ESRF and DialysisRenal disease  negative genitourinary   Musculoskeletal   Abdominal   Peds  Hematology  (+) Blood dyscrasia, anemia , thrombocytopenia   Anesthesia Other Findings Last HD Thursday for treatment of hyperkalemia. Progressive weakness over last three days. Diarrhea started yesterday.  Does endorse poor appetite with nausea however no vomiting  CXR: IMPRESSION: 1. Cardiomegaly with mild to moderate severity increased interstitial lung markings. 2. Mild superimposed areas of mid and lower lung field atelectasis and/or infiltrate.  Past Medical History: No date: Diabetes mellitus ([place]) No date: ESRD on hemodialysis ([place]) No date: Hypertension No date: Renal disorder    Past Surgical History: No date: AV FISTULA PLACEMENT; Left No date: FOOT SURGERY  BMI    Body Mass Index: 27.11 kg/m      Reproductive/Obstetrics negative OB ROS                            Anesthesia Physical Anesthesia Plan  ASA: 3  Anesthesia Plan: General   Post-op Pain Management:    Induction: Intravenous  PONV Risk Score and Plan: 2 and Propofol infusion and TIVA  Airway Management Planned: Natural Airway  Additional Equipment:   Intra-op Plan:   Post-operative Plan:   Informed  Consent: I have reviewed the patients History and Physical, chart, labs and discussed the procedure including the risks, benefits and alternatives for the proposed anesthesia with the patient or authorized representative who has indicated his/her understanding and acceptance.     Dental Advisory Given  Plan Discussed with: Anesthesiologist, CRNA and Surgeon  Anesthesia Plan Comments: (History and consent obtained in Spanish.  LMA/GETA backup discussed.  Patient consented for risks of anesthesia including but not limited to:  - adverse reactions to medications - damage to eyes, teeth, lips or other oral mucosa - nerve damage due to positioning  - sore throat or hoarseness - damage to heart, brain, nerves, lungs, other parts of body or loss of life  Informed patient about role of CRNA in peri- and intra-operative care.  Patient voiced understanding.)       Anesthesia Quick Evaluation

## 2022-07-20 NOTE — Progress Notes (Signed)
Progress Note   Patient: Jerry [name] XTG:[id number] DOB: 1966-07-07 DOA: 07/19/2022     1 DOS: the patient was seen and examined on 07/20/2022   Brief hospital course: Jerry [name] is a 56 y.o. male with medical history significant of end-stage renal disease on hemodialysis, diabetes, essential hypertension, who came in from home on evening of 07/19/2022 for evaluation of diarrhea (melena) since the day before as well as generalized weakness.  Patient started having vomiting in the ER which was coffee-ground emesis.  Admitted to the hospital with acute blood loss anemia with initial Hbg of 5.0, elevated potassium > 7.  Two units pRBC's were ordered for transfusion on admission.  Being hemodynamically stable, pt was admitted to hospitalist service, placed in stepdown unit for close monitoring.  Gastroenterology consulted for endoscopic evaluation.  Nephrology consulted to continue hemodialysis.     Assessment and Plan: * GI bleed Pt was having melena at home, in ED had coffee-ground emesis, severely anemic. --GI consulted --EGD today showed non-bleeding gastric ulcers with clean bases, gastritis --Diet per GI --Follow up biopsy path --See ABLA --Continue IV PPI --D/c on Protonix 40 mg PO BID x 8 weeks --No NSAID's or ASA --Repeat EGD in 8 weeks --GI follow up as scheduled  ABLA (acute blood loss anemia) Hbg on admission 5.0>>4.7, due to GI bleeding with melena and coffee-ground emesis in the ED. --Two units pRBC's transfused this AM --Trend Hbg, transfuse if < 7.0 --See GI Bleed  Hyperkalemia K was 6.7 on admission, in setting of ESRD and active upper GI bleed.  Treated in the ED with IV insulin +dextrose, improved to K 3.6. --Monitor BMP --Continue dialysis  Hyponatremia Mild, Na 133 on admission, normalized Monitor BMP  ESRD on hemodialysis [facility]) Nephrology following for dilaysis  Diabetes mellitus ([place]) On sliding scale Novolog for  now  Hypertension BP's stable despite GI bleed.  Soft DBP's. Hold antihypertensives for now. Maintain MAP>65        Subjective: Pt seen with assistance of virtual Spanish interpreter today, wife at bedside.  His only complaint is being in pain in his back and bottom from laying in one position in bed.  Denies abdominal pain.  Pt unsure if stools were dark at home, he is blind, wife did not see them.   Physical Exam: Vitals:   07/20/22 1620 07/20/22 1630 07/20/22 1640 07/20/22 1647  BP: 138/73 (!) 112/49 (!) 119/49 (!) 127/52  Pulse: 75 73 74 75  Resp: 16 20 16 20   Temp: 98 F (36.7 C)     TempSrc: Temporal     SpO2: 100% 95% (!) 9%   Weight:      Height:       General exam: awake, no acute distress but appears uncomfortable HEENT: keeps eyes closed, moist mucus membranes, hearing grossly normal  Respiratory system: CTAB, normal respiratory effort. Cardiovascular system: normal S1/S2, RRR, no pedal edema.   Gastrointestinal system: soft, NT, mildly distended Central nervous system: no gross focal neurologic deficits, normal speech Extremities: moves all, no edema, normal tone Skin: dry, intact, normal temperature Psychiatry: anxious mood, congruent affect, judgement and insight appear normal   Data Reviewed:  Notable labs --- Hbg 4.7 >> 6.5 >> 7.5  Family Communication: wife at bedside  Disposition: Status is: Inpatient Remains inpatient appropriate because: remains on IV therapies and closely monitoring severe anemia that required 3 units blood transfusion today.   Planned Discharge Destination: Home    Time spent: 50 minutes  Author:  Ezekiel Slocumb, DO 07/20/2022 6:25 PM  For on call review [email or web address].

## 2022-07-20 NOTE — Assessment & Plan Note (Signed)
Nephrology following for dilaysis

## 2022-07-20 NOTE — Progress Notes (Signed)
   07/20/22 0406  Vitals  Temp 98.4 F (36.9 C)  Temp Source Oral  BP (!) 120/51  MAP (mmHg) 69  BP Location Right Arm  BP Method Automatic  Patient Position (if appropriate) Lying  Pulse Rate 75  Pulse Rate Source Monitor  ECG Heart Rate 75  Resp 15  Oxygen Therapy  SpO2 97 %  O2 Device Room Air  Patient Activity (if Appropriate) In bed  Pulse Oximetry Type Continuous  During Treatment Monitoring  HD Safety Checks Performed Yes  Intra-Hemodialysis Comments Tolerated well;Tx completed  Post Treatment  Dialyzer Clearance Clear  Duration of HD Treatment -hour(s) 3 hour(s)  Liters Processed 72  Fluid Removed 1000 mL  Tolerated HD Treatment Yes  Post-Hemodialysis Comments HD goal met  AVG/AVF Arterial Site Held (minutes) 10 minutes  AVG/AVF Venous Site Held (minutes) 10 minutes  Note  Observations pt alert, no c/o, stable  Fistula / Graft Left Upper arm Arteriovenous fistula  No placement date or time found.   Placed prior to admission: Yes  Orientation: Left  Access Location: Upper arm  Access Type: Arteriovenous fistula  Site Condition No complications  Fistula / Graft Assessment Present;Thrill;Bruit  Status Flushed;Deaccessed  Drainage Description None   TX fin. W/o difficulty.

## 2022-07-20 NOTE — Transfer of Care (Signed)
Immediate Anesthesia Transfer of Care Note  Patient: Jerry [name]  Procedure(s) Performed: ESOPHAGOGASTRODUODENOSCOPY (EGD) WITH PROPOFOL  Patient Location: PACU  Anesthesia Type:General  Level of Consciousness: drowsy  Airway & Oxygen Therapy: Patient Spontanous Breathing and Patient connected to nasal cannula oxygen  Post-op Assessment: Report given to RN  Post vital signs: stable  Last Vitals:  Vitals Value Taken Time  BP    Temp    Pulse    Resp    SpO2      Last Pain:  Vitals:   07/20/22 1549  TempSrc: Temporal  PainSc:          Complications: No notable events documented.

## 2022-07-20 NOTE — Op Note (Signed)
[facility] Gastroenterology Patient Name: Jerry [name] Procedure Date: 07/20/2022 4:04 PM MRN: [id number] Account #: [id number] Date of Birth: 1966/02/03 Admit Type: Inpatient Age: 56 Room: [facility] ENDO ROOM 1 Gender: Male Note Status: [place] Instrument Name: Upper Endoscope [id number] Procedure:             Upper GI endoscopy Indications:           Melena Providers:             Annamaria Helling DO, DO Referring MD:          Neomia Dear (Referring MD) Medicines:             Monitored Anesthesia Care Complications:         No immediate complications. Estimated blood loss:                         Minimal. Procedure:             Pre-Anesthesia Assessment:                        - Prior to the procedure, a History and Physical was                         performed, and patient medications and allergies were                         reviewed. The patient is competent. The risks and                         benefits of the procedure and the sedation options and                         risks were discussed with the patient. All questions                         were answered and informed consent was obtained.                         Patient identification and proposed procedure were                         verified by the physician, the nurse, the anesthetist                         and the technician in the endoscopy suite. Mental                         Status Examination: alert and oriented. Airway                         Examination: normal oropharyngeal airway and neck                         mobility. Respiratory Examination: clear to                         auscultation. CV Examination: RRR, no murmurs, no S3  or S4. Prophylactic Antibiotics: The patient does not                         require prophylactic antibiotics. Prior                         Anticoagulants: The patient has taken no previous                          anticoagulant or antiplatelet agents. ASA Grade                         Assessment: IV - A patient with severe systemic                         disease that is a constant threat to life. After                         reviewing the risks and benefits, the patient was                         deemed in satisfactory condition to undergo the                         procedure. The anesthesia plan was to use monitored                         anesthesia care (MAC). Immediately prior to                         administration of medications, the patient was                         re-assessed for adequacy to receive sedatives. The                         heart rate, respiratory rate, oxygen saturations,                         blood pressure, adequacy of pulmonary ventilation, and                         response to care were monitored throughout the                         procedure. The physical status of the patient was                         re-assessed after the procedure.                        After obtaining informed consent, the endoscope was                         passed under direct vision. Throughout the procedure,                         the patient's blood pressure, pulse, and oxygen  saturations were monitored continuously. The Endoscope                         was introduced through the mouth, and advanced to the                         second part of duodenum. The upper GI endoscopy was                         accomplished without difficulty. The patient tolerated                         the procedure well. Findings:      The duodenal bulb, first portion of the duodenum and second portion of       the duodenum were normal. Estimated blood loss: none.      Three non-bleeding superficial gastric ulcers with a clean ulcer base       (Forrest Class III) were found in the gastric antrum. The largest lesion       was 4 mm in largest dimension. Biopsies were taken  with a cold forceps       for histology. Biopsies were taken with a cold forceps for Helicobacter       pylori testing. Estimated blood loss was minimal.      Area of gastropathy appreciated in greater curvature. Biopsies were       taken with a cold forceps for histology. Estimated blood loss was       minimal.      Scattered moderate inflammation was found in the entire examined stomach.      LA Grade D (one or more mucosal breaks involving at least 75% of       esophageal circumference) esophagitis with no bleeding was found.       Estimated blood loss: none.      A non-bleeding Mallory-Weiss tear with stigmata of recent bleeding was       found. Estimated blood loss: none.      Esophagogastric landmarks were identified: the gastroesophageal junction       was found at 38 cm from the incisors.      The exam of the esophagus was otherwise normal. Impression:            - Normal duodenal bulb, first portion of the duodenum                         and second portion of the duodenum.                        - Non-bleeding gastric ulcers with a clean ulcer base                         (Forrest Class III). Biopsied.                        - Gastritis.                        - LA Grade D esophagitis with no bleeding.                        - Mallory-Weiss tear.                        -  Esophagogastric landmarks identified. Recommendation:        - Return patient to ICU for ongoing care.                        - Full liquid diet.                        - No aspirin, ibuprofen, naproxen, or other                         non-steroidal anti-inflammatory drugs.                        - Continue present medications.                        - Use Protonix (pantoprazole) 40 mg IV BID 72 hours.                         then transition to po for 8 weeks.                        - Await pathology results.                        - Repeat upper endoscopy in 8 weeks to evaluate the                          response to therapy.                        - Return to GI office at appointment to be scheduled.                        - The findings and recommendations were discussed with                         the patient's primary physician.                        - The findings and recommendations were discussed with                         the patient.                        - Recommend outpatient colonoscopy.                        GI to sign off. Available as needed. Procedure Code(s):     --- Professional ---                        [phone], Esophagogastroduodenoscopy, flexible,                         transoral; with biopsy, single or multiple Diagnosis Code(s):     --- Professional ---                        K25.9, Gastric ulcer, unspecified as acute or chronic,  without hemorrhage or perforation                        K29.70, Gastritis, unspecified, without bleeding                        K20.90, Esophagitis, unspecified without bleeding                        K22.6, Gastro-esophageal laceration-hemorrhage syndrome                        K92.1, Melena (includes Hematochezia) CPT copyright 2019 American Medical Association. All rights reserved. The codes documented in this report are preliminary and upon coder review may  be revised to meet current compliance requirements. Attending Participation:      I personally performed the entire procedure. Volney American, DO Annamaria Helling DO, DO 07/20/2022 4:29:57 PM This report has been signed electronically. Number of Addenda: 0 Note Initiated On: 07/20/2022 4:04 PM Estimated Blood Loss:  Estimated blood loss was minimal.      [facility]

## 2022-07-20 NOTE — Interval H&P Note (Signed)
History and Physical Interval Note: Preprocedure H&P from 07/20/22  was reviewed and there was no interval change after seeing and examining the patient.  Written consent was obtained from the patient after discussion of risks, benefits, and alternatives. Patient has consented to proceed with Esophagogastroduodenoscopy with possible intervention   07/20/2022 3:56 PM  Jerry [name]  has presented today for surgery, with the diagnosis of anemia, hematochezia, vomiting.  The various methods of treatment have been discussed with the patient and family. After consideration of risks, benefits and other options for treatment, the patient has consented to  Procedure(s): ESOPHAGOGASTRODUODENOSCOPY (EGD) WITH PROPOFOL (N/A) as a surgical intervention.  The patient's history has been reviewed, patient examined, no change in status, stable for surgery.  I have reviewed the patient's chart and labs.  Questions were answered to the patient's satisfaction.     Annamaria Helling

## 2022-07-21 DIAGNOSIS — G8929 Other chronic pain: Secondary | ICD-10-CM | POA: Diagnosis present

## 2022-07-21 LAB — CBC
HCT: 21.7 % — ABNORMAL LOW (ref 39.0–52.0)
HCT: 21.9 % — ABNORMAL LOW (ref 39.0–52.0)
HCT: 22.4 % — ABNORMAL LOW (ref 39.0–52.0)
HCT: 23.6 % — ABNORMAL LOW (ref 39.0–52.0)
Hemoglobin: 7.4 g/dL — ABNORMAL LOW (ref 13.0–17.0)
Hemoglobin: 7.6 g/dL — ABNORMAL LOW (ref 13.0–17.0)
Hemoglobin: 7.7 g/dL — ABNORMAL LOW (ref 13.0–17.0)
Hemoglobin: 7.9 g/dL — ABNORMAL LOW (ref 13.0–17.0)
MCH: 31.6 pg (ref 26.0–34.0)
MCH: 31.7 pg (ref 26.0–34.0)
MCH: 31.8 pg (ref 26.0–34.0)
MCH: 32.1 pg (ref 26.0–34.0)
MCHC: 33.5 g/dL (ref 30.0–36.0)
MCHC: 33.8 g/dL (ref 30.0–36.0)
MCHC: 34.4 g/dL (ref 30.0–36.0)
MCHC: 35 g/dL (ref 30.0–36.0)
MCV: 91.6 fL (ref 80.0–100.0)
MCV: 91.8 fL (ref 80.0–100.0)
MCV: 94 fL (ref 80.0–100.0)
MCV: 94.8 fL (ref 80.0–100.0)
Platelets: 79 10*3/uL — ABNORMAL LOW (ref 150–400)
Platelets: 81 10*3/uL — ABNORMAL LOW (ref 150–400)
Platelets: 81 10*3/uL — ABNORMAL LOW (ref 150–400)
Platelets: 89 10*3/uL — ABNORMAL LOW (ref 150–400)
RBC: 2.33 MIL/uL — ABNORMAL LOW (ref 4.22–5.81)
RBC: 2.37 MIL/uL — ABNORMAL LOW (ref 4.22–5.81)
RBC: 2.44 MIL/uL — ABNORMAL LOW (ref 4.22–5.81)
RBC: 2.49 MIL/uL — ABNORMAL LOW (ref 4.22–5.81)
RDW: 19.2 % — ABNORMAL HIGH (ref 11.5–15.5)
RDW: 19.4 % — ABNORMAL HIGH (ref 11.5–15.5)
RDW: 19.8 % — ABNORMAL HIGH (ref 11.5–15.5)
RDW: 19.9 % — ABNORMAL HIGH (ref 11.5–15.5)
WBC: 4.1 10*3/uL (ref 4.0–10.5)
WBC: 4.3 10*3/uL (ref 4.0–10.5)
WBC: 4.4 10*3/uL (ref 4.0–10.5)
WBC: 4.5 10*3/uL (ref 4.0–10.5)
nRBC: 0 % (ref 0.0–0.2)
nRBC: 0 % (ref 0.0–0.2)
nRBC: 0 % (ref 0.0–0.2)
nRBC: 0.5 % — ABNORMAL HIGH (ref 0.0–0.2)

## 2022-07-21 LAB — BASIC METABOLIC PANEL
Anion gap: 12 (ref 5–15)
BUN: 73 mg/dL — ABNORMAL HIGH (ref 6–20)
CO2: 25 mmol/L (ref 22–32)
Calcium: 8.7 mg/dL — ABNORMAL LOW (ref 8.9–10.3)
Chloride: 98 mmol/L (ref 98–111)
Creatinine, Ser: 6.74 mg/dL — ABNORMAL HIGH (ref 0.61–1.24)
GFR, Estimated: 9 mL/min — ABNORMAL LOW (ref >60)
Glucose, Bld: 130 mg/dL — ABNORMAL HIGH (ref 70–99)
Potassium: 5 mmol/L (ref 3.5–5.1)
Sodium: 135 mmol/L (ref 135–145)

## 2022-07-21 LAB — GLUCOSE, CAPILLARY
Glucose-Capillary: 126 mg/dL — ABNORMAL HIGH (ref 70–99)
Glucose-Capillary: 125 mg/dL — ABNORMAL HIGH (ref 70–99)
Glucose-Capillary: 119 mg/dL — ABNORMAL HIGH (ref 70–99)
Glucose-Capillary: 120 mg/dL — ABNORMAL HIGH (ref 70–99)
Glucose-Capillary: 116 mg/dL — ABNORMAL HIGH (ref 70–99)

## 2022-07-21 LAB — HEPATITIS B SURFACE ANTIBODY, QUANTITATIVE: Hep B S AB Quant (Post): 20.5 m[IU]/mL (ref >9.9)

## 2022-07-21 MED ORDER — GABAPENTIN 300 MG PO CAPS
300.0000 mg | ORAL_CAPSULE | Freq: Once | ORAL | Status: AC
  Administered 2022-07-21: 300 mg via ORAL
  Filled 2022-07-21: qty 1

## 2022-07-21 MED ORDER — HYDROMORPHONE HCL 1 MG/ML IJ SOLN
0.5000 mg | INTRAMUSCULAR | Status: DC | PRN
  Administered 2022-07-21 – 2022-07-22 (×3): 0.5 mg via INTRAVENOUS
  Filled 2022-07-21: qty 1
  Filled 2022-07-21: qty 0.5
  Filled 2022-07-21: qty 1

## 2022-07-21 MED ORDER — HEPARIN SODIUM (PORCINE) 1000 UNIT/ML IJ SOLN
INTRAMUSCULAR | Status: AC
  Filled 2022-07-21: qty 10

## 2022-07-21 MED ORDER — TRAMADOL HCL 50 MG PO TABS
100.0000 mg | ORAL_TABLET | Freq: Two times a day (BID) | ORAL | Status: DC | PRN
  Administered 2022-07-21 – 2022-07-23 (×3): 100 mg via ORAL
  Filled 2022-07-21 (×4): qty 2

## 2022-07-21 MED ORDER — GABAPENTIN 300 MG PO CAPS: 300.0000 mg | ORAL_CAPSULE | ORAL | Status: DC

## 2022-07-21 NOTE — Progress Notes (Signed)
[facility]  ROUNDING NOTE   Subjective:   Jerry [name] is a 56 y.o. male with past medical history of essential hypertension, diabetes, and end stage renal disease on hemodialysis. Patient presented to the emergency department complaining of cough and diarrhea. Patient has been admitted for Hematochezia [K92.1] Hyperkalemia [E87.5] GI bleed [K92.2] Coffee ground emesis [K92.0] Diarrhea, unspecified type [R19.7] Nausea and vomiting, unspecified vomiting type [R11.2]  Patient is known to our practice and receives outpatient dialysis treatments at [facility] on a MWF schedule, supervised by Dr. Candiss Norse.  Last treatment received on Wednesday.  Patient seen and evaluated at bedside in ICU.  Spanish-speaking only, video interpreter used.   Family member at bedside.  Patient seen during dialysis Tolerating well    HEMODIALYSIS FLOWSHEET:  Blood Flow Rate (mL/min): 400 mL/min Arterial Pressure (mmHg): -130 mmHg Venous Pressure (mmHg): 250 mmHg TMP (mmHg): 4 mmHg Ultrafiltration Rate (mL/min): 543 mL/min Dialysate Flow Rate (mL/min): 300 ml/min Dialysis Fluid Bolus: Normal Saline Bolus Amount (mL): 300 mL  Patient complains of back pain.  Tramadol did not help enough.  Also requesting gabapentin for pain in the feet related to peripheral neuropathy.   Objective:  Vital signs in last 24 hours:  Temp:  [98 F (36.7 C)-99.5 F (37.5 C)] 98.3 F (36.8 C) (10/07 0826) Pulse Rate:  [66-274] 73 (10/07 1100) Resp:  [12-29] 13 (10/07 1100) BP: (112-178)/(49-79) 168/74 (10/07 1100) SpO2:  [9 %-100 %] 100 % (10/07 1100) Weight:  [78.2 kg] 78.2 kg (10/07 0810)  Weight change:  Filed Weights   07/20/22 0420 07/20/22 0450 07/21/22 0810  Weight: 77 kg 76.2 kg 78.2 kg    Intake/Output: I/O last 3 completed shifts: In: 78 [P.O.:360; Blood:1050] Out: 1001 [Other:1000; Stool:1]   Intake/Output this shift:  Total I/O In: 272.6 [I.V.:272.6] Out: -   Physical  Exam: General: Ill-appearing  Head: Normocephalic, atraumatic. Moist oral mucosal membranes  Eyes: Anicteric  Lungs:  Clear to auscultation, normal effort  Heart: Regular rate and rhythm  Abdomen:  Soft, nontender  Extremities: Trace peripheral edema.  Neurologic: Nonfocal, moving all four extremities  Skin: No lesions  Access: Left aVF    Basic Metabolic Panel: Recent Labs  Lab 07/19/22 1919 07/20/22 0240 07/20/22 0601 07/21/22 0544  NA 133*  --  136 135  K 6.7* 3.6 3.7 5.0  CL 96*  --  98 98  CO2 24  --  28 25  GLUCOSE 210*  --  93 130*  BUN 86*  --  53* 73*  CREATININE 7.92*  --  4.04* 6.74*  CALCIUM 8.9  --  8.3* 8.7*     Liver Function Tests: Recent Labs  Lab 07/19/22 1919 07/20/22 0601  AST 31 23  ALT 33 29  ALKPHOS 221* 174*  BILITOT 1.0 1.0  PROT 6.8 6.0*  ALBUMIN 3.0* 2.7*    Recent Labs  Lab 07/19/22 1919  LIPASE 29    No results for input(s): "AMMONIA" in the last 168 hours.  CBC: Recent Labs  Lab 07/19/22 1919 07/20/22 0601 07/20/22 1247 07/20/22 1743 07/20/22 2314 07/21/22 0544  WBC 5.3  5.3 5.9 6.1 4.9 4.4 4.5  NEUTROABS 4.3  --   --   --   --   --   HGB 5.0*  5.0* 4.7* 6.5* 7.5* 7.5* 7.7*  HCT 15.6*  15.4* 13.8* 19.4* 22.0* 22.0* 22.4*  MCV 102.6*  102.7* 97.9 94.2 92.4 92.4 91.8  PLT 106*  111* 97* 98* 93* 85*  89*     Cardiac Enzymes: No results for input(s): "CKTOTAL", "CKMB", "CKMBINDEX", "TROPONINI" in the last 168 hours.  BNP: Invalid input(s): "POCBNP"  CBG: Recent Labs  Lab 07/20/22 0436 07/20/22 0808 07/20/22 1139 07/20/22 2201 07/21/22 0744  GLUCAP 94 94 110* 139* 126*     Microbiology: Results for orders placed or performed during the hospital encounter of 04/11/21  Resp Panel by RT-PCR (Flu A&B, Covid) Nasopharyngeal Swab     Status: None   Collection Time: 04/11/21  6:03 PM   Specimen: Nasopharyngeal Swab; Nasopharyngeal(NP) swabs in vial transport medium  Result Value Ref Range Status   SARS  Coronavirus 2 by RT PCR NEGATIVE NEGATIVE Final    Comment: (NOTE) SARS-CoV-2 target nucleic acids are NOT DETECTED.  The SARS-CoV-2 RNA is generally detectable in upper respiratory specimens during the acute phase of infection. The lowest concentration of SARS-CoV-2 viral copies this assay can detect is 138 copies/mL. A negative result does not preclude SARS-Cov-2 infection and should not be used as the sole basis for treatment or other patient management decisions. A negative result may occur with  improper specimen collection/handling, submission of specimen other than nasopharyngeal swab, presence of viral mutation(s) within the areas targeted by this assay, and inadequate number of viral copies(<138 copies/mL). A negative result must be combined with clinical observations, patient history, and epidemiological information. The expected result is Negative.  Fact Sheet for Patients:  EntrepreneurPulse.com.au  Fact Sheet for Healthcare Providers:  IncredibleEmployment.be  This test is no t yet approved or cleared by the [place] FDA and  has been authorized for detection and/or diagnosis of SARS-CoV-2 by FDA under an Emergency Use Authorization (EUA). This EUA will remain  in effect (meaning this test can be used) for the duration of the COVID-19 declaration under Section 564(b)(1) of the Act, 21 U.S.C.section 360bbb-3(b)(1), unless the authorization is terminated  or revoked sooner.       Influenza A by PCR NEGATIVE NEGATIVE Final   Influenza B by PCR NEGATIVE NEGATIVE Final    Comment: (NOTE) The Xpert Xpress SARS-CoV-2/FLU/RSV plus assay is intended as an aid in the diagnosis of influenza from Nasopharyngeal swab specimens and should not be used as a sole basis for treatment. Nasal washings and aspirates are unacceptable for Xpert Xpress SARS-CoV-2/FLU/RSV testing.  Fact Sheet for  Patients: EntrepreneurPulse.com.au  Fact Sheet for Healthcare Providers: IncredibleEmployment.be  This test is not yet approved or cleared by the [place] FDA and has been authorized for detection and/or diagnosis of SARS-CoV-2 by FDA under an Emergency Use Authorization (EUA). This EUA will remain in effect (meaning this test can be used) for the duration of the COVID-19 declaration under Section 564(b)(1) of the Act, 21 U.S.C. section 360bbb-3(b)(1), unless the authorization is terminated or revoked.  Performed at [facility], [address], [place] [address]   C Difficile Quick Screen w PCR reflex     Status: None   Collection Time: 04/12/21  2:16 AM   Specimen: STOOL  Result Value Ref Range Status   C Diff antigen NEGATIVE NEGATIVE Final   C Diff toxin NEGATIVE NEGATIVE Final   C Diff interpretation No C. difficile detected.  Final    Comment: Performed at [facility], [address] [place], [place] [address]  Gastrointestinal Panel by PCR , Stool     Status: None   Collection Time: 04/12/21  2:16 AM   Specimen: Stool  Result Value Ref Range Status   Campylobacter  species NOT DETECTED NOT DETECTED Final   Plesimonas shigelloides NOT DETECTED NOT DETECTED Final   Salmonella species NOT DETECTED NOT DETECTED Final   Yersinia enterocolitica NOT DETECTED NOT DETECTED Final   Vibrio species NOT DETECTED NOT DETECTED Final   Vibrio cholerae NOT DETECTED NOT DETECTED Final   Enteroaggregative E coli (EAEC) NOT DETECTED NOT DETECTED Final   Enteropathogenic E coli (EPEC) NOT DETECTED NOT DETECTED Final   Enterotoxigenic E coli (ETEC) NOT DETECTED NOT DETECTED Final   Shiga like toxin producing E coli (STEC) NOT DETECTED NOT DETECTED Final   Shigella/Enteroinvasive E coli (EIEC) NOT DETECTED NOT DETECTED Final   Cryptosporidium NOT DETECTED NOT DETECTED Final   Cyclospora cayetanensis NOT DETECTED NOT  DETECTED Final   Entamoeba histolytica NOT DETECTED NOT DETECTED Final   Giardia lamblia NOT DETECTED NOT DETECTED Final   Adenovirus F40/41 NOT DETECTED NOT DETECTED Final   Astrovirus NOT DETECTED NOT DETECTED Final   Norovirus GI/GII NOT DETECTED NOT DETECTED Final   Rotavirus A NOT DETECTED NOT DETECTED Final   Sapovirus (I, II, IV, and V) NOT DETECTED NOT DETECTED Final    Comment: Performed at [facility], [address] [place], [place] [address]    Coagulation Studies: No results for input(s): "LABPROT", "INR" in the last 72 hours.  Urinalysis: No results for input(s): "COLORURINE", "LABSPEC", "PHURINE", "GLUCOSEU", "HGBUR", "BILIRUBINUR", "KETONESUR", "PROTEINUR", "UROBILINOGEN", "NITRITE", "LEUKOCYTESUR" in the last 72 hours.  Invalid input(s): "APPERANCEUR"    Imaging: DG Chest 2 View  Result Date: 07/19/2022 CLINICAL DATA:  Cough and diarrhea. EXAM: CHEST - 2 VIEW COMPARISON:  April 11, 2021 FINDINGS: The cardiac silhouette is moderately enlarged and unchanged in size. There is mild to moderate severity calcification of the aortic arch. Mild to moderate severity diffusely increased interstitial lung markings are seen. Mild superimposed areas of atelectasis and/or infiltrate are noted along the periphery of the mid and lower lung fields, bilaterally. There is no evidence of a pleural effusion or pneumothorax. The visualized skeletal structures are unremarkable. IMPRESSION: 1. Cardiomegaly with mild to moderate severity increased interstitial lung markings. 2. Mild superimposed areas of mid and lower lung field atelectasis and/or infiltrate. Electronically Signed   By: Virgina Norfolk M.D.   On: 07/19/2022 19:52     Medications:    sodium chloride     pantoprazole 8 mg/hr (07/21/22 0736)    Chlorhexidine Gluconate Cloth  6 each Topical Q0600   [START ON 07/23/2022] gabapentin  300 mg Oral Q M,W,F-1800   heparin sodium (porcine)       insulin aspart  0-5 Units  Subcutaneous QHS   insulin aspart  0-6 Units Subcutaneous TID WC   [START ON 07/23/2022] pantoprazole  40 mg Intravenous Q12H   acetaminophen, heparin sodium (porcine), HYDROmorphone (DILAUDID) injection, mouth rinse, traMADol  Assessment/ Plan:  Mr. Jerry [name] is a 56 y.o.  male with past medical history of essential hypertension, diabetes, and end stage renal disease on hemodialysis. Patient presented to the emergency department complaining of cough and diarrhea. Patient has been admitted for Hematochezia [K92.1] Hyperkalemia [E87.5] GI bleed [K92.2] Coffee ground emesis [K92.0] Diarrhea, unspecified type [R19.7] Nausea and vomiting, unspecified vomiting type [R11.2]  [facility]/MWF/left aVF/78.5 kg  Hyperkalemia with end-stage renal disease on hemodialysis.  Will maintain outpatient schedule if possible.  Potassium corrected with hemodialysis.  Seen during dialysis today.  We will plan on resuming MWF schedule starting next week.  2. Anemia of chronic kidney disease with acute blood  loss: hemoglobin 8.5.  Lab Results  Component Value Date   HGB 7.7 (L) 07/21/2022  Coffee-ground emesis and melena noted on ED arrival.  Multiple blood transfusions this admission.  EGD showed nonbleeding gastric ulcers, gastritis, esophagitis, Mallory-Weiss tear.  Avoid nonsteroidals.  3. Secondary Hyperparathyroidism: with outpatient labs: PTH 65, phosphorus 5.4, calcium 9.9 on 05/22/22.   Lab Results  Component Value Date   CALCIUM 8.7 (L) 07/21/2022   PHOS 3.6 04/13/2021    Calcium within acceptable range.  Patient prescribed calcium carbonate and sevelamer outpatient.  Currently held  4.  Hypertension with chronic kidney disease.  Home regimen includes hydralazine, currently held.    LOS: 2 Jerry [name] 10/7/202311:13 AM

## 2022-07-21 NOTE — Progress Notes (Addendum)
Progress Note   Patient: Jerry [name] CXK:[id number] DOB: 06/23/1966 DOA: 07/19/2022     2 DOS: the patient was seen and examined on 07/21/2022   Brief hospital course: Jerry [name] is a 56 y.o. male with medical history significant of end-stage renal disease on hemodialysis, diabetes, essential hypertension, who came in from home on evening of 07/19/2022 for evaluation of diarrhea (melena) since the day before as well as generalized weakness.  Patient started having vomiting in the ER which was coffee-ground emesis.  Admitted to the hospital with acute blood loss anemia with initial Hbg of 5.0, elevated potassium > 7.  Two units pRBC's were ordered for transfusion on admission.  Being hemodynamically stable, pt was admitted to hospitalist service, placed in stepdown unit for close monitoring.  Gastroenterology consulted for endoscopic evaluation.  Nephrology consulted to continue hemodialysis.     Assessment and Plan: * GI bleed Pt was having melena at home, in ED had coffee-ground emesis, severely anemic. --GI consulted --EGD today showed non-bleeding gastric ulcers with clean bases, gastritis --Diet per GI --Follow up biopsy path --See ABLA --Continue IV PPI --D/c on Protonix 40 mg PO BID x 8 weeks --No NSAID's or ASA --Repeat EGD in 8 weeks --GI follow up as scheduled  ABLA (acute blood loss anemia) Hbg on admission 5.0>>4.7, due to GI bleeding with melena and coffee-ground emesis in the ED. status post transfusion of 3 units PRBCs on 10/6. Hemoglobin this morning 7.7 >> 7.6 --Trend Hbg, transfuse if < 7.0 --See GI Bleed  Hyperkalemia K was 6.7 on admission, in setting of ESRD and active upper GI bleed.  Treated in the ED with IV insulin +dextrose, improved to K 3.6. --Monitor BMP --Continue dialysis  Hyponatremia Mild, Na 133 on admission, normalized Monitor BMP  ESRD on hemodialysis [facility]) Nephrology following for dilaysis  Diabetes mellitus  (Yellow Medicine) On sliding scale Novolog for now  Hypertension BP's stable despite GI bleed.  Soft DBP's. Hold antihypertensives for now. Maintain MAP>65  Chronic back pain Resume home tramadol and gabapentin. Low-dose IV Dilaudid as needed for breakthrough pain        Subjective: Pt seen with assistance of virtual Spanish interpreter today, wife at bedside.  He was receiving dialysis in the room in ICU.  He is extremely uncomfortable due to his low back pain which is chronic.  Requesting his home tramadol.  Nursing reported in excess of 10 melanotic stools overnight.  Patient denies any abdominal pain or nausea.  No other acute complaints aside from uncontrolled pain.  Asks when he can go home.   Physical Exam: Vitals:   07/21/22 1219 07/21/22 1221 07/21/22 1300 07/21/22 1400  BP: (!) 174/74  (!) 141/66 (!) 149/58  Pulse: 73  72 73  Resp: 13  13 (!) 22  Temp: 97.8 F (36.6 C)     TempSrc: Oral     SpO2: 100%  100% 96%  Weight:  77.2 kg    Height:       General exam: awake, no acute distress but appears very uncomfortable due to pain, restless HEENT: keeps eyes closed, moist mucus membranes, hearing grossly normal  Respiratory system: CTAB, normal respiratory effort. Cardiovascular system: normal S1/S2, RRR, no pedal edema.   Gastrointestinal system: Soft nontender abdomen nondistended Central nervous system: no gross focal neurologic deficits, normal speech Extremities: moves all, no edema, normal tone Psychiatry: anxious mood, congruent affect, judgement and insight appear normal   Data Reviewed:  Notable labs --- Hbg 4.7 >>  6.5 >> 7.5 >> 7.7 >> 7.6 .  Platelets remain low but overall stable 81k  Family Communication: wife at bedside  Disposition: Status is: Inpatient Remains inpatient appropriate because: Severity of illness with uncontrolled pain, ongoing melena with several BMs overnight, closely monitoring hemoglobin   Planned Discharge Destination: Home    Time  spent: 40 minutes  Author: Ezekiel Slocumb, DO 07/21/2022 3:20 PM  For on call review [email or web address].

## 2022-07-21 NOTE — Assessment & Plan Note (Signed)
Resume home tramadol and gabapentin. Low-dose IV Dilaudid as needed for breakthrough pain

## 2022-07-21 NOTE — Progress Notes (Signed)
Spanish interpreter number # [phone]. Assessment complete with the assistance of the [place] interpreter. Patient complained of pain 10/10. PRN pain medication administered. Patient was very rude during the assessment. Patient accused the RN of trying to make patient and American citizen.

## 2022-07-21 NOTE — Progress Notes (Signed)
   07/21/22 1205  Vitals  Temp 97.8 F (36.6 C)  Temp Source Oral  BP (!) 176/82  MAP (mmHg) 111  BP Location Right Arm  BP Method Automatic  Patient Position (if appropriate) Lying  Pulse Rate 69  Pulse Rate Source Monitor  ECG Heart Rate 70  Resp (!) 22  Oxygen Therapy  SpO2 100 %  O2 Device Room Air  Patient Activity (if Appropriate) In bed  Pulse Oximetry Type Continuous  During Treatment Monitoring  Blood Flow Rate (mL/min) 200 mL/min  HD Safety Checks Performed Yes  Intra-Hemodialysis Comments Tx completed  Post Treatment  Dialyzer Clearance Clear  Duration of HD Treatment -hour(s) 3.5 hour(s)  Hemodialysis Intake (mL) 0 mL  Liters Processed 84  Fluid Removed 1000 mL  Tolerated HD Treatment Yes  AVG/AVF Arterial Site Held (minutes) 10 minutes  AVG/AVF Venous Site Held (minutes) 10 minutes  Fistula / Graft Left Upper arm Arteriovenous fistula  No placement date or time found.   Placed prior to admission: Yes  Orientation: Left  Access Location: Upper arm  Access Type: Arteriovenous fistula  Site Condition No complications  Fistula / Graft Assessment Present;Thrill;Bruit  Status Deaccessed  Needle Size 15  Drainage Description None

## 2022-07-21 NOTE — Progress Notes (Signed)
Interpreter [phone] called for spanish translation to in form patient and spouse of room change.

## 2022-07-22 DIAGNOSIS — D696 Thrombocytopenia, unspecified: Secondary | ICD-10-CM | POA: Diagnosis present

## 2022-07-22 LAB — CBC
HCT: 23.4 % — ABNORMAL LOW (ref 39.0–52.0)
Hemoglobin: 7.8 g/dL — ABNORMAL LOW (ref 13.0–17.0)
MCH: 32 pg (ref 26.0–34.0)
MCHC: 33.3 g/dL (ref 30.0–36.0)
MCV: 95.9 fL (ref 80.0–100.0)
Platelets: 74 10*3/uL — ABNORMAL LOW (ref 150–400)
RBC: 2.44 MIL/uL — ABNORMAL LOW (ref 4.22–5.81)
RDW: 19.8 % — ABNORMAL HIGH (ref 11.5–15.5)
WBC: 4.9 10*3/uL (ref 4.0–10.5)
nRBC: 0 % (ref 0.0–0.2)

## 2022-07-22 LAB — BASIC METABOLIC PANEL
Anion gap: 9 (ref 5–15)
BUN: 39 mg/dL — ABNORMAL HIGH (ref 6–20)
CO2: 28 mmol/L (ref 22–32)
Calcium: 8.6 mg/dL — ABNORMAL LOW (ref 8.9–10.3)
Chloride: 92 mmol/L — ABNORMAL LOW (ref 98–111)
Creatinine, Ser: 5.23 mg/dL — ABNORMAL HIGH (ref 0.61–1.24)
GFR, Estimated: 12 mL/min — ABNORMAL LOW (ref >60)
Glucose, Bld: 94 mg/dL (ref 70–99)
Potassium: 3.9 mmol/L (ref 3.5–5.1)
Sodium: 129 mmol/L — ABNORMAL LOW (ref 135–145)

## 2022-07-22 LAB — HEMOGLOBIN AND HEMATOCRIT, BLOOD
HCT: 23.2 % — ABNORMAL LOW (ref 39.0–52.0)
Hemoglobin: 7.7 g/dL — ABNORMAL LOW (ref 13.0–17.0)

## 2022-07-22 LAB — GLUCOSE, CAPILLARY
Glucose-Capillary: 95 mg/dL (ref 70–99)
Glucose-Capillary: 137 mg/dL — ABNORMAL HIGH (ref 70–99)
Glucose-Capillary: 129 mg/dL — ABNORMAL HIGH (ref 70–99)
Glucose-Capillary: 156 mg/dL — ABNORMAL HIGH (ref 70–99)

## 2022-07-22 MED ORDER — PANTOPRAZOLE SODIUM 40 MG IV SOLR
40.0000 mg | Freq: Two times a day (BID) | INTRAVENOUS | Status: DC
  Administered 2022-07-22 – 2022-07-23 (×3): 40 mg via INTRAVENOUS
  Filled 2022-07-22 (×3): qty 10

## 2022-07-22 MED ORDER — GABAPENTIN 300 MG PO CAPS
300.0000 mg | ORAL_CAPSULE | Freq: Two times a day (BID) | ORAL | Status: DC
  Administered 2022-07-22 – 2022-07-23 (×3): 300 mg via ORAL
  Filled 2022-07-22 (×3): qty 1

## 2022-07-22 NOTE — Progress Notes (Signed)
[facility]  ROUNDING NOTE   Subjective:   Jerry [name] is a 56 y.o. male with past medical history of essential hypertension, diabetes, and end stage renal disease on hemodialysis. Patient presented to the emergency department complaining of cough and diarrhea. Patient has been admitted for Hematochezia [K92.1] Hyperkalemia [E87.5] GI bleed [K92.2] Coffee ground emesis [K92.0] Diarrhea, unspecified type [R19.7] Nausea and vomiting, unspecified vomiting type [R11.2]  Patient is known to our practice and receives outpatient dialysis treatments at [facility] on a MWF schedule, supervised by Dr. Candiss Norse.  Last treatment received on Wednesday.  Patient seen and evaluated at bedside in ICU.  Spanish-speaking only, video interpreter used.   Family member at bedside.  Patient resting well this morning, appears more comfortable than yesterday. Visit conducted with video spanish interpretor. Patient states his pain well controlled today. Appetite remains poor but improving. Denies nausea and vomiting.    Objective:  Vital signs in last 24 hours:  Temp:  [97.8 F (36.6 C)-98.2 F (36.8 C)] 98 F (36.7 C) (10/08 0827) Pulse Rate:  [65-73] 68 (10/08 0827) Resp:  [8-22] 12 (10/08 0827) BP: (136-176)/(58-84) 167/75 (10/08 0827) SpO2:  [93 %-100 %] 100 % (10/08 0827) Weight:  [77.2 kg-78.6 kg] 78.6 kg (10/07 2319)  Weight change:  Filed Weights   07/21/22 0810 07/21/22 1221 07/21/22 2319  Weight: 78.2 kg 77.2 kg 78.6 kg    Intake/Output: I/O last 3 completed shifts: In: 832.5 [P.O.:360; I.V.:472.5] Out: 1001 [Other:1000; Stool:1]   Intake/Output this shift:  No intake/output data recorded.  Physical Exam: General: NAD  Head: Normocephalic, atraumatic. Moist oral mucosal membranes  Eyes: Anicteric  Lungs:  Clear to auscultation, normal effort  Heart: Regular rate and rhythm  Abdomen:  Soft, nontender  Extremities: Trace peripheral edema.  Neurologic:  Nonfocal, moving all four extremities  Skin: No lesions  Access: Left aVF    Basic Metabolic Panel: Recent Labs  Lab 07/19/22 1919 07/20/22 0240 07/20/22 0601 07/21/22 0544 07/22/22 0711  NA 133*  --  136 135 129*  K 6.7* 3.6 3.7 5.0 3.9  CL 96*  --  98 98 92*  CO2 24  --  28 25 28   GLUCOSE 210*  --  93 130* 94  BUN 86*  --  53* 73* 39*  CREATININE 7.92*  --  4.04* 6.74* 5.23*  CALCIUM 8.9  --  8.3* 8.7* 8.6*     Liver Function Tests: Recent Labs  Lab 07/19/22 1919 07/20/22 0601  AST 31 23  ALT 33 29  ALKPHOS 221* 174*  BILITOT 1.0 1.0  PROT 6.8 6.0*  ALBUMIN 3.0* 2.7*    Recent Labs  Lab 07/19/22 1919  LIPASE 29    No results for input(s): "AMMONIA" in the last 168 hours.  CBC: Recent Labs  Lab 07/19/22 1919 07/20/22 0601 07/21/22 0544 07/21/22 1052 07/21/22 1650 07/21/22 2339 07/22/22 0711  WBC 5.3  5.3   < > 4.5 4.3 4.4 4.1 4.9  NEUTROABS 4.3  --   --   --   --   --   --   HGB 5.0*  5.0*   < > 7.7* 7.6* 7.4* 7.9* 7.8*  HCT 15.6*  15.4*   < > 22.4* 21.7* 21.9* 23.6* 23.4*  MCV 102.6*  102.7*   < > 91.8 91.6 94.0 94.8 95.9  PLT 106*  111*   < > 89* 81* 79* 81* 74*   < > = values in this interval not displayed.  Cardiac Enzymes: No results for input(s): "CKTOTAL", "CKMB", "CKMBINDEX", "TROPONINI" in the last 168 hours.  BNP: Invalid input(s): "POCBNP"  CBG: Recent Labs  Lab 07/21/22 1137 07/21/22 1706 07/21/22 2132 07/21/22 2323 07/22/22 0800  GLUCAP 125* 119* 120* 116* 95     Microbiology: Results for orders placed or performed during the hospital encounter of 04/11/21  Resp Panel by RT-PCR (Flu A&B, Covid) Nasopharyngeal Swab     Status: None   Collection Time: 04/11/21  6:03 PM   Specimen: Nasopharyngeal Swab; Nasopharyngeal(NP) swabs in vial transport medium  Result Value Ref Range Status   SARS Coronavirus 2 by RT PCR NEGATIVE NEGATIVE Final    Comment: (NOTE) SARS-CoV-2 target nucleic acids are NOT  DETECTED.  The SARS-CoV-2 RNA is generally detectable in upper respiratory specimens during the acute phase of infection. The lowest concentration of SARS-CoV-2 viral copies this assay can detect is 138 copies/mL. A negative result does not preclude SARS-Cov-2 infection and should not be used as the sole basis for treatment or other patient management decisions. A negative result may occur with  improper specimen collection/handling, submission of specimen other than nasopharyngeal swab, presence of viral mutation(s) within the areas targeted by this assay, and inadequate number of viral copies(<138 copies/mL). A negative result must be combined with clinical observations, patient history, and epidemiological information. The expected result is Negative.  Fact Sheet for Patients:  EntrepreneurPulse.com.au  Fact Sheet for Healthcare Providers:  IncredibleEmployment.be  This test is no t yet approved or cleared by the [place] FDA and  has been authorized for detection and/or diagnosis of SARS-CoV-2 by FDA under an Emergency Use Authorization (EUA). This EUA will remain  in effect (meaning this test can be used) for the duration of the COVID-19 declaration under Section 564(b)(1) of the Act, 21 U.S.C.section 360bbb-3(b)(1), unless the authorization is terminated  or revoked sooner.       Influenza A by PCR NEGATIVE NEGATIVE Final   Influenza B by PCR NEGATIVE NEGATIVE Final    Comment: (NOTE) The Xpert Xpress SARS-CoV-2/FLU/RSV plus assay is intended as an aid in the diagnosis of influenza from Nasopharyngeal swab specimens and should not be used as a sole basis for treatment. Nasal washings and aspirates are unacceptable for Xpert Xpress SARS-CoV-2/FLU/RSV testing.  Fact Sheet for Patients: EntrepreneurPulse.com.au  Fact Sheet for Healthcare Providers: IncredibleEmployment.be  This test is not yet  approved or cleared by the [place] FDA and has been authorized for detection and/or diagnosis of SARS-CoV-2 by FDA under an Emergency Use Authorization (EUA). This EUA will remain in effect (meaning this test can be used) for the duration of the COVID-19 declaration under Section 564(b)(1) of the Act, 21 U.S.C. section 360bbb-3(b)(1), unless the authorization is terminated or revoked.  Performed at [facility], [place], [place] [address]   C Difficile Quick Screen w PCR reflex     Status: None   Collection Time: 04/12/21  2:16 AM   Specimen: STOOL  Result Value Ref Range Status   C Diff antigen NEGATIVE NEGATIVE Final   C Diff toxin NEGATIVE NEGATIVE Final   C Diff interpretation No C. difficile detected.  Final    Comment: Performed at [facility], [address] [place], [place] [address]  Gastrointestinal Panel by PCR , Stool     Status: None   Collection Time: 04/12/21  2:16 AM   Specimen: Stool  Result Value Ref Range Status   Campylobacter species NOT DETECTED NOT DETECTED  Final   Plesimonas shigelloides NOT DETECTED NOT DETECTED Final   Salmonella species NOT DETECTED NOT DETECTED Final   Yersinia enterocolitica NOT DETECTED NOT DETECTED Final   Vibrio species NOT DETECTED NOT DETECTED Final   Vibrio cholerae NOT DETECTED NOT DETECTED Final   Enteroaggregative E coli (EAEC) NOT DETECTED NOT DETECTED Final   Enteropathogenic E coli (EPEC) NOT DETECTED NOT DETECTED Final   Enterotoxigenic E coli (ETEC) NOT DETECTED NOT DETECTED Final   Shiga like toxin producing E coli (STEC) NOT DETECTED NOT DETECTED Final   Shigella/Enteroinvasive E coli (EIEC) NOT DETECTED NOT DETECTED Final   Cryptosporidium NOT DETECTED NOT DETECTED Final   Cyclospora cayetanensis NOT DETECTED NOT DETECTED Final   Entamoeba histolytica NOT DETECTED NOT DETECTED Final   Giardia lamblia NOT DETECTED NOT DETECTED Final   Adenovirus F40/41 NOT DETECTED NOT  DETECTED Final   Astrovirus NOT DETECTED NOT DETECTED Final   Norovirus GI/GII NOT DETECTED NOT DETECTED Final   Rotavirus A NOT DETECTED NOT DETECTED Final   Sapovirus (I, II, IV, and V) NOT DETECTED NOT DETECTED Final    Comment: Performed at [facility], [address] [place], [place] [address]    Coagulation Studies: No results for input(s): "LABPROT", "INR" in the last 72 hours.  Urinalysis: No results for input(s): "COLORURINE", "LABSPEC", "PHURINE", "GLUCOSEU", "HGBUR", "BILIRUBINUR", "KETONESUR", "PROTEINUR", "UROBILINOGEN", "NITRITE", "LEUKOCYTESUR" in the last 72 hours.  Invalid input(s): "APPERANCEUR"    Imaging: No results found.   Medications:    sodium chloride      Chlorhexidine Gluconate Cloth  6 each Topical Q0600   [START ON 07/23/2022] gabapentin  300 mg Oral Q M,W,F-1800   insulin aspart  0-5 Units Subcutaneous QHS   insulin aspart  0-6 Units Subcutaneous TID WC   pantoprazole  40 mg Intravenous Q12H   acetaminophen, HYDROmorphone (DILAUDID) injection, mouth rinse, traMADol  Assessment/ Plan:  Jerry [name] is a 56 y.o.  male with past medical history of essential hypertension, diabetes, and end stage renal disease on hemodialysis. Patient presented to the emergency department complaining of cough and diarrhea. Patient has been admitted for Hematochezia [K92.1] Hyperkalemia [E87.5] GI bleed [K92.2] Coffee ground emesis [K92.0] Diarrhea, unspecified type [R19.7] Nausea and vomiting, unspecified vomiting type [R11.2]  [facility]/MWF/left aVF/78.5 kg  Hyperkalemia with end-stage renal disease on hemodialysis.  Will maintain outpatient schedule if possible.  Potassium corrected with hemodialysis.  Received dialysis yesterday, UF 1L achieved. Next treatment scheduled for Monday.   2. Anemia of chronic kidney disease with acute blood loss: hemoglobin 8.5.  Lab Results  Component Value Date   HGB 7.8 (L) 07/22/2022   Coffee-ground emesis and melena noted on ED arrival.  Multiple blood transfusions this admission.  EGD showed nonbleeding gastric ulcers, gastritis, esophagitis, Mallory-Weiss tear.  Avoid nonsteroidals. Hemoglobin stable today.   3. Secondary Hyperparathyroidism: with outpatient labs: PTH 65, phosphorus 5.4, calcium 9.9 on 05/22/22.   Lab Results  Component Value Date   CALCIUM 8.6 (L) 07/22/2022   PHOS 3.6 04/13/2021    Patient prescribed calcium carbonate and sevelamer outpatient.  Will monitor bone minerals during this admission. Currently held  4.  Hypertension with chronic kidney disease.  Home regimen includes hydralazine, currently held.    LOS: 3   10/8/202311:36 AM

## 2022-07-22 NOTE — Assessment & Plan Note (Signed)
Platelets on admission 111k, slowly trended down, 81 >> 74k today. --Avoid heparin products --SCD's

## 2022-07-22 NOTE — Progress Notes (Addendum)
Progress Note   Patient: Jerry [name] [id number]:[id number] DOB: [birth year] DOA: 07/19/2022     3 DOS: the patient was seen and examined on 07/22/2022   Brief hospital course: Jerry [name] is a 56 y.o. male with medical history significant of end-stage renal disease on hemodialysis, diabetes, essential hypertension, who came in from home on evening of 07/19/2022 for evaluation of diarrhea (melena) since the day before as well as generalized weakness.  Patient started having vomiting in the ER which was coffee-ground emesis.  Admitted to the hospital with acute blood loss anemia with initial Hbg of 5.0, elevated potassium > 7.  Two units pRBC's were ordered for transfusion on admission.  Being hemodynamically stable, pt was admitted to hospitalist service, placed in stepdown unit for close monitoring.  Gastroenterology consulted for endoscopic evaluation.  Nephrology consulted to continue hemodialysis.     Assessment and Plan: * GI bleed Pt was having melena at home, in ED had coffee-ground emesis, severely anemic. --GI consulted --EGD today showed non-bleeding gastric ulcers with clean bases, gastritis --Diet per GI --Follow up biopsy path --See ABLA --Continue IV PPI BID --D/c on Protonix 40 mg PO BID x 8 weeks --No NSAID's or ASA --Repeat EGD in 8 weeks --GI follow up as scheduled --Spanish info sheet regarding diet for peptic ulcer disease included in AVS and discussed in detail  ABLA (acute blood loss anemia) Hbg on admission 5.0>>4.7, due to GI bleeding with melena and coffee-ground emesis in the ED. status post transfusion of 3 units PRBCs on 10/6. Hemoglobin ...7.9 >> 7.8 >> 7.7 --Trend Hbg, transfuse if < 7.0 --See GI Bleed  Hyperkalemia K was 6.7 on admission, in setting of ESRD and active upper GI bleed.  Treated in the ED with IV insulin +dextrose, improved to K 3.6. --Monitor BMP --Continue dialysis  Hyponatremia Mild, Na 133 on admission,  normalized Monitor BMP  ESRD on hemodialysis [facility]) Nephrology following for dilaysis  Diabetes mellitus ([place]) On sliding scale Novolog for now  Hypertension BP's stable despite GI bleed.  Soft DBP's. Hold antihypertensives for now. Maintain MAP>65  Thrombocytopenia (HCC) Platelets on admission 111k, slowly trended down, 81 >> 74k today. --Avoid heparin products --SCD's  Chronic back pain Resume home tramadol and gabapentin. Low-dose IV Dilaudid as needed for breakthrough pain        Subjective: Pt seen with assistance of virtual Spanish interpreter today, wife at bedside.  His back pain is better controlled today, a lot more comfortable, resting well.  Reports tolerating soft diet, no abdominal pain or n/v.  We discussed diet to follow given his ulcers in great detail.  He and wife asked about many specific foods and drinks, state it will be difficult to avoid spicy.  He otherwise says feeling okay, hopes to go home today or tomorrow.   Physical Exam: Vitals:   07/21/22 2319 07/22/22 0455 07/22/22 0827 07/22/22 1604  BP: (!) 165/84 (!) 159/75 (!) 167/75 (!) 151/70  Pulse: 70 68 68 67  Resp: 16 16 12 12   Temp: 98.2 F (36.8 C) 98 F (36.7 C) 98 F (36.7 C) 98.1 F (36.7 C)  TempSrc: Oral Oral  Oral  SpO2: 93% 99% 100% 96%  Weight: 78.6 kg     Height: 5\' 6"  (1.676 m)      General exam: awake, no acute distress  HEENT: keeps eyes closed, moist mucus membranes, hearing grossly normal  Respiratory system: on room air, normal respiratory effort. Cardiovascular system: RRR, no pedal edema.  Gastrointestinal system: Soft nontender abdomen nondistended Central nervous system: A&O, no gross focal neurologic deficits, normal speech Extremities: moves all, no edema, normal tone Psychiatry: anxious mood, congruent affect, judgement and insight appear normal   Data Reviewed:  Notable labs --- Hbg 4.7 >> 6.5 >> 7.5 >> 7.7 >> 7.6>>7.9>>7.8>>7.7 .   Platelets remain low  but overall stable 74k  Family Communication: wife at bedside  Disposition: Status is: Inpatient Remains inpatient appropriate because: remains on IV PPI and will monitor tolerance for advanced diet as well as Hbg for 24 more hours.  Expect d/c tomorrow after dialysis.   Planned Discharge Destination: Home    Time spent: 45 minutes  Author: Ezekiel Slocumb, DO 07/22/2022 5:28 PM  For on call review [email or web address].

## 2022-07-22 NOTE — Evaluation (Signed)
Physical Therapy Evaluation Patient Details Name: Jerry [name] MRN: [id number] DOB: 11/23/1965 Today's Date: 07/22/2022  History of Present Illness  Pt is a 56 y.o. male with past medical history of essential hypertension, diabetes, and end stage renal disease on hemodialysis, vision impaired. Patient presented to the emergency department complaining of cough and diarrhea. Admitted for hematochezia, hyperkalemia, GI bleed.   Clinical Impression  Patient alert, interpretor utilized throughout, pt denied pain. Per pt report at baseline he is modI for mobility with SPC (does endorse some furniture walking), modI for ADLs, said he can also normally feed himself once it is in front of him.  Supine <> sit with supervision, good sitting balance. Sit <> stand with RW and CGA, cued for hand placement. He ambulated ~23ft with RW and constant hands on assist for RW due to vision impairment, but did not need physical assistance to ambulate or stand.  Overall the patient demonstrated deficits (see "PT Problem List") that impede the patient's functional abilities, safety, and mobility and would benefit from skilled PT intervention. Recommendation at this time is HHPT with frequent/constant supervision assistance to maximize safety, mobility and function.        Recommendations for follow up therapy are one component of a multi-disciplinary discharge planning process, led by the attending physician.  Recommendations may be updated based on patient status, additional functional criteria and insurance authorization.  Follow Up Recommendations Home health PT      Assistance Recommended at Discharge Frequent or constant Supervision/Assistance  Patient can return home with the following  Assistance with cooking/housework;Assist for transportation;Help with stairs or ramp for entrance;Assistance with feeding;A little help with walking and/or transfers;A little help with bathing/dressing/bathroom;Direct  supervision/assist for medications management    Equipment Recommendations Rolling walker (2 wheels)  Recommendations for Other Services       Functional Status Assessment Patient has had a recent decline in their functional status and demonstrates the ability to make significant improvements in function in a reasonable and predictable amount of time.     Precautions / Restrictions Precautions Precautions: Fall Restrictions Weight Bearing Restrictions: No      Mobility  Bed Mobility Overal bed mobility: Needs Assistance Bed Mobility: Supine to Sit, Sit to Supine     Supine to sit: Supervision, HOB elevated Sit to supine: Supervision        Transfers Overall transfer level: Needs assistance Equipment used: Rolling walker (2 wheels) Transfers: Sit to/from Stand Sit to Stand: Min guard           General transfer comment: cued for hand placement    Ambulation/Gait Ambulation/Gait assistance: Min guard Gait Distance (Feet): 80 Feet Assistive device: Rolling walker (2 wheels)         General Gait Details: constant facilitation of hands on assist for RW due to vision impairment  Stairs            Wheelchair Mobility    Modified Rankin (Stroke Patients Only)       Balance Overall balance assessment: Needs assistance Sitting-balance support: Feet supported Sitting balance-Leahy Scale: Good     Standing balance support: Bilateral upper extremity supported Standing balance-Leahy Scale: Fair Standing balance comment: improved stability with RW                             Pertinent Vitals/Pain Pain Assessment Pain Assessment: No/denies pain    Home Living Family/patient expects to be discharged to:: Private residence Living Arrangements:  Spouse/significant other Available Help at Discharge: Family Type of Home: Mobile home Home Access: Ramped entrance       Home Layout: One level Home Equipment: Cane - single point       Prior Function Prior Level of Function : Independent/Modified Independent;Needs assist             Mobility Comments: ambulatory with SPC, but did endorse using the wall at home as needed ADLs Comments: needs assist for IADLs, modI for ADLs (assistance if needed more for vision impairment than weakness/cognition)     Hand Dominance        Extremity/Trunk Assessment   Upper Extremity Assessment Upper Extremity Assessment: Overall WFL for tasks assessed    Lower Extremity Assessment Lower Extremity Assessment: Generalized weakness (able to lift against gravity)       Communication   Communication: Interpreter utilized  Cognition Arousal/Alertness: Awake/alert Behavior During Therapy: WFL for tasks assessed/performed Overall Cognitive Status: Within Functional Limits for tasks assessed                                          General Comments      Exercises     Assessment/Plan    PT Assessment Patient needs continued PT services  PT Problem List Decreased mobility;Decreased strength;Decreased activity tolerance;Decreased balance       PT Treatment Interventions Therapeutic exercise;DME instruction;Gait training;Balance training;Stair training;Neuromuscular re-education;Functional mobility training;Therapeutic activities;Patient/family education    PT Goals (Current goals can be found in the Care Plan section)  Acute Rehab PT Goals Patient Stated Goal: to feel better PT Goal Formulation: With patient Time For Goal Achievement: 08/05/22 Potential to Achieve Goals: Good    Frequency Min 2X/week     Co-evaluation               AM-PAC PT "6 Clicks" Mobility  Outcome Measure Help needed turning from your back to your side while in a flat bed without using bedrails?: None Help needed moving from lying on your back to sitting on the side of a flat bed without using bedrails?: None Help needed moving to and from a bed to a chair  (including a wheelchair)?: None Help needed standing up from a chair using your arms (e.g., wheelchair or bedside chair)?: None Help needed to walk in hospital room?: A Little Help needed climbing 3-5 steps with a railing? : A Little 6 Click Score: 22    End of Session Equipment Utilized During Treatment: Gait belt Activity Tolerance: Patient tolerated treatment well Patient left: in bed;with call bell/phone within reach;with bed alarm set Nurse Communication: Mobility status PT Visit Diagnosis: Other abnormalities of gait and mobility (R26.89);Difficulty in walking, not elsewhere classified (R26.2);Muscle weakness (generalized) (M62.81)    Time: 2878-6767 PT Time Calculation (min) (ACUTE ONLY): 25 min   Charges:   PT Evaluation $PT Eval Low Complexity: 1 Low PT Treatments $Therapeutic Activity: 8-22 mins        Lieutenant Diego PT, DPT 10:08 AM,07/22/22

## 2022-07-22 NOTE — TOC Initial Note (Signed)
Transition of Care (TOC) - Initial/Assessment Note    Patient Details  Name: Jerry [name] MRN: [id number] Date of Birth: 05/31/1966  Transition of Care (TOC) CM/SW Contact:     E , LCSW Phone Number: 07/22/2022, 2:44 PM  Clinical Narrative:                 CSW met with patient, spouse, daughter at bedside. They declined Interpreter tablet, elected for daughter Sobeida to interpret.  Patient lives with his wife who is home with him 24/7.  [facility], next appointment is Tuesday per daughter.  Patient has no HH history. Patient declines charity HHPT, per daughter he does not feel he needs it.  Patient has a cane at home. Agreeable to RW being ordered. Referral for charity RW made to Jasmine with [employer] for delivery to bedside today prior to discharge.  Patient's family asked about financial assistance for hospital bill, CSW provided contact number and website for [facility] Financial Assistance.    Expected Discharge Plan: Home/Self Care Barriers to Discharge: Barriers Resolved   Patient Goals and CMS Choice Patient states their goals for this hospitalization and ongoing recovery are:: home with spouse CMS Medicare.gov Compare Post Acute Care list provided to:: Patient Choice offered to / list presented to : Patient, Adult Children, Spouse  Expected Discharge Plan and Services Expected Discharge Plan: Home/Self Care       Living arrangements for the past 2 months: Single Family Home                 DME Arranged: [place] DME Agency: [employer] Date DME Agency Contacted: 07/22/22   Representative spoke with at DME Agency: Jasmine            Prior Living Arrangements/Services Living arrangements for the past 2 months: Single Family Home Lives with:: Spouse Patient language and need for interpreter reviewed:: Yes Do you feel safe going back to the place where you live?: Yes      Need for Family Participation in Patient  Care: Yes (Comment) Care giver support system in place?: Yes (comment) Current home services: DME Criminal Activity/Legal Involvement Pertinent to Current Situation/Hospitalization: No - Comment as needed  Activities of Daily Living      Permission Sought/Granted Permission sought to share information with : Facility Contact Representative, Family Supports Permission granted to share information with : Yes, Verbal Permission Granted     Permission granted to share info w AGENCY: [employer]  Permission granted to share info w Relationship: daughter - Sobeida [phone]     Emotional Assessment       Orientation: : Oriented to Self, Oriented to Place, Oriented to  Time, Oriented to Situation Alcohol / Substance Use: Not Applicable Psych Involvement: No (comment)  Admission diagnosis:  Hematochezia [K92.1] Hyperkalemia [E87.5] GI bleed [K92.2] Coffee ground emesis [K92.0] Diarrhea, unspecified type [R19.7] Nausea and vomiting, unspecified vomiting type [R11.2] Patient Active Problem List   Diagnosis Date Noted   Chronic back pain 07/21/2022   ABLA (acute blood loss anemia) 07/20/2022   Hyperkalemia 07/19/2022   GI bleed 07/19/2022   ESRD on hemodialysis (HCC) 04/11/2021   Abdominal pain 04/11/2021   Hyponatremia 04/11/2021   Diabetes mellitus (HCC) 07/21/2013   Hypertension 02/18/2013   PCP:  Rubio, Jessica, NP Pharmacy:   [facility] - [place] - [address]  [place] [address] Phone: [phone] Fax: [phone]     Social   Determinants of Health (SDOH) Interventions    Readmission Risk Interventions     No data to display

## 2022-07-23 ENCOUNTER — Encounter: Payer: Self-pay | Admitting: Gastroenterology

## 2022-07-23 DIAGNOSIS — K254 Chronic or unspecified gastric ulcer with hemorrhage: Secondary | ICD-10-CM

## 2022-07-23 LAB — CBC
HCT: 24.1 % — ABNORMAL LOW (ref 39.0–52.0)
Hemoglobin: 8.1 g/dL — ABNORMAL LOW (ref 13.0–17.0)
MCH: 32.4 pg (ref 26.0–34.0)
MCHC: 33.6 g/dL (ref 30.0–36.0)
MCV: 96.4 fL (ref 80.0–100.0)
Platelets: 78 10*3/uL — ABNORMAL LOW (ref 150–400)
RBC: 2.5 MIL/uL — ABNORMAL LOW (ref 4.22–5.81)
RDW: 19.8 % — ABNORMAL HIGH (ref 11.5–15.5)
WBC: 4.8 10*3/uL (ref 4.0–10.5)
nRBC: 0.4 % — ABNORMAL HIGH (ref 0.0–0.2)

## 2022-07-23 LAB — TYPE AND SCREEN
ABO/RH(D): O POS
Antibody Screen: NEGATIVE
Unit division: 0
Unit division: 0
Unit division: 0
Unit division: 0
Unit division: 0
Unit division: 0

## 2022-07-23 LAB — BPAM RBC
Blood Product Expiration Date: 202311072359
Blood Product Expiration Date: 202311082359
Blood Product Expiration Date: 202311102359
Blood Product Expiration Date: 202311112359
Blood Product Expiration Date: 202311112359
Blood Product Expiration Date: 202311122359
ISSUE DATE / TIME: 202310060804
ISSUE DATE / TIME: 202310060951
ISSUE DATE / TIME: 202310061341
Unit Type and Rh: 5100
Unit Type and Rh: 5100
Unit Type and Rh: 5100
Unit Type and Rh: 5100
Unit Type and Rh: 5100
Unit Type and Rh: 5100

## 2022-07-23 LAB — BASIC METABOLIC PANEL
Anion gap: 13 (ref 5–15)
BUN: 51 mg/dL — ABNORMAL HIGH (ref 6–20)
CO2: 26 mmol/L (ref 22–32)
Calcium: 8.5 mg/dL — ABNORMAL LOW (ref 8.9–10.3)
Chloride: 91 mmol/L — ABNORMAL LOW (ref 98–111)
Creatinine, Ser: 7.85 mg/dL — ABNORMAL HIGH (ref 0.61–1.24)
GFR, Estimated: 7 mL/min — ABNORMAL LOW (ref >60)
Glucose, Bld: 145 mg/dL — ABNORMAL HIGH (ref 70–99)
Potassium: 4.4 mmol/L (ref 3.5–5.1)
Sodium: 130 mmol/L — ABNORMAL LOW (ref 135–145)

## 2022-07-23 LAB — GLUCOSE, CAPILLARY
Glucose-Capillary: 137 mg/dL — ABNORMAL HIGH (ref 70–99)
Glucose-Capillary: 131 mg/dL — ABNORMAL HIGH (ref 70–99)
Glucose-Capillary: 123 mg/dL — ABNORMAL HIGH (ref 70–99)
Glucose-Capillary: 163 mg/dL — ABNORMAL HIGH (ref 70–99)

## 2022-07-23 LAB — PREPARE RBC (CROSSMATCH)

## 2022-07-23 LAB — HEMOGLOBIN A1C
Hgb A1c MFr Bld: 6.4 % — ABNORMAL HIGH (ref 4.8–5.6)
Mean Plasma Glucose: 137 mg/dL

## 2022-07-23 MED ORDER — PANTOPRAZOLE SODIUM 40 MG PO TBEC: 40.0000 mg | DELAYED_RELEASE_TABLET | Freq: Two times a day (BID) | ORAL | 1 refills | Status: DC

## 2022-07-23 NOTE — TOC Transition Note (Signed)
Transition of Care [facility]) - CM/SW Discharge Note   Patient Details  Name: Jerry [name] MRN: [id number] Date of Birth: [birth year]  Transition of Care [facility]) CM/SW Contact:  Beverly Sessions, RN Phone Number: 07/23/2022, 10:19 AM   Clinical Narrative:     Patient to discharge today.  Confirmed RW was delivered to room yesterday Protonix was [facility] [place] Family to transport   Final next level of care: Home/Self Care Barriers to Discharge: Barriers Resolved   Patient Goals and CMS Choice Patient states their goals for this hospitalization and ongoing recovery are:: home with spouse CMS Medicare.gov Compare Post Acute Care list provided to:: Patient Choice offered to / list presented to : Patient, Adult Children, Spouse  Discharge Placement                       Discharge Plan and Services                DME Arranged: Walker rolling DME Agency: [employer] Date DME Agency Contacted: 07/22/22   Representative spoke with at DME Agency: [place]) Interventions     Readmission Risk Interventions     No data to display

## 2022-07-23 NOTE — Plan of Care (Signed)
  Problem: Health Behavior/Discharge Planning: Goal: Ability to manage health-related needs will improve Outcome: Progressing   Problem: Clinical Measurements: Goal: Will remain free from infection Outcome: Progressing   Problem: Clinical Measurements: Goal: Diagnostic test results will improve Outcome: Progressing   Problem: Activity: Goal: Risk for activity intolerance will decrease Outcome: Progressing

## 2022-07-23 NOTE — Progress Notes (Signed)
Hemodialysis Note  Received patient in bed to unit. Alert and oriented. Informed consent signed and in chart.   Treatment initiated:0905 Treatment completed:1217  Patient tolerated treatment well. Transported back to the room alert, without acute distress. Report given to patient's RN.  Access used: LUA AVF Access issues:None   Total UF removed: 999.6 ml Medications given:None  Post HD VS: Stable 142/57 . 68, 15, 97.7 Post HD weight: 74.8kg  Treatment time reduced by nephrology team during rounds.   Forrest Moron, RN [facility]

## 2022-07-23 NOTE — Progress Notes (Signed)
[facility]  ROUNDING NOTE   Subjective:   Jerry [name] is a 56 y.o. male with past medical history of essential hypertension, diabetes, and end stage renal disease on hemodialysis. Patient presented to the emergency department complaining of cough and diarrhea. Patient has been admitted for Hematochezia [K92.1] Hyperkalemia [E87.5] GI bleed [K92.2] Coffee ground emesis [K92.0] Diarrhea, unspecified type [R19.7] Nausea and vomiting, unspecified vomiting type [R11.2]  Patient is known to our practice and receives outpatient dialysis treatments at [facility] on a MWF schedule, supervised by Dr. Candiss [name].  Last treatment received on Wednesday.  Patient seen and evaluated at bedside in ICU.  Spanish-speaking only, video interpreter used.   Family member at bedside.  Patient seen and evaluated during dialysis   HEMODIALYSIS FLOWSHEET:  Blood Flow Rate (mL/min): 400 mL/min Arterial Pressure (mmHg): -160 mmHg Venous Pressure (mmHg): 240 mmHg TMP (mmHg): 5 mmHg Ultrafiltration Rate (mL/min): 629 mL/min Dialysate Flow Rate (mL/min): 300 ml/min Dialysis Fluid Bolus: Normal Saline Bolus Amount (mL): 300 mL  Tolerated treatment well   Objective:  Vital signs in last 24 hours:  Temp:  [98.1 F (36.7 C)-98.7 F (37.1 C)] 98.6 F (37 C) (10/09 0749) Pulse Rate:  [66-75] 68 (10/09 1208) Resp:  [12-18] 15 (10/09 1208) BP: (129-173)/(57-107) 142/57 (10/09 1208) SpO2:  [96 %-100 %] 100 % (10/09 1208) Weight:  [76.5 kg] 76.5 kg (10/09 0931)  Weight change:  Filed Weights   07/21/22 1221 07/21/22 2319 07/23/22 0931  Weight: 77.2 kg 78.6 kg 76.5 kg    Intake/Output: I/O last 3 completed shifts: In: 630.4 [P.O.:540; I.V.:90.4] Out: 0    Intake/Output this shift:  No intake/output data recorded.  Physical Exam: General: NAD  Head: Normocephalic, atraumatic. Moist oral mucosal membranes  Eyes: Anicteric  Lungs:  Clear to auscultation, normal effort   Heart: Regular rate and rhythm  Abdomen:  Soft, nontender  Extremities: No peripheral edema.  Neurologic: Nonfocal, moving all four extremities  Skin: No lesions  Access: Left aVF    Basic Metabolic Panel: Recent Labs  Lab 07/19/22 1919 07/20/22 0240 07/20/22 0601 07/21/22 0544 07/22/22 0711 07/23/22 0552  NA 133*  --  136 135 129* 130*  K 6.7* 3.6 3.7 5.0 3.9 4.4  CL 96*  --  98 98 92* 91*  CO2 24  --  28 25 28 26   GLUCOSE 210*  --  93 130* 94 145*  BUN 86*  --  53* 73* 39* 51*  CREATININE 7.92*  --  4.04* 6.74* 5.23* 7.85*  CALCIUM 8.9  --  8.3* 8.7* 8.6* 8.5*     Liver Function Tests: Recent Labs  Lab 07/19/22 1919 07/20/22 0601  AST 31 23  ALT 33 29  ALKPHOS 221* 174*  BILITOT 1.0 1.0  PROT 6.8 6.0*  ALBUMIN 3.0* 2.7*    Recent Labs  Lab 07/19/22 1919  LIPASE 29    No results for input(s): "AMMONIA" in the last 168 hours.  CBC: Recent Labs  Lab 07/19/22 1919 07/20/22 0601 07/21/22 1052 07/21/22 1650 07/21/22 2339 07/22/22 0711 07/22/22 1308 07/23/22 0552  WBC 5.3  5.3   < > 4.3 4.4 4.1 4.9  --  4.8  NEUTROABS 4.3  --   --   --   --   --   --   --   HGB 5.0*  5.0*   < > 7.6* 7.4* 7.9* 7.8* 7.7* 8.1*  HCT 15.6*  15.4*   < > 21.7* 21.9* 23.6* 23.4* 23.2*  24.1*  MCV 102.6*  102.7*   < > 91.6 94.0 94.8 95.9  --  96.4  PLT 106*  111*   < > 81* 79* 81* 74*  --  78*   < > = values in this interval not displayed.     Cardiac Enzymes: No results for input(s): "CKTOTAL", "CKMB", "CKMBINDEX", "TROPONINI" in the last 168 hours.  BNP: Invalid input(s): "POCBNP"  CBG: Recent Labs  Lab 07/22/22 0800 07/22/22 1152 07/22/22 1643 07/22/22 2056 07/23/22 0757  GLUCAP 95 137* 129* 156* 123*     Microbiology: Results for orders placed or performed during the hospital encounter of 04/11/21  Resp Panel by RT-PCR (Flu A&B, Covid) Nasopharyngeal Swab     Status: None   Collection Time: 04/11/21  6:03 PM   Specimen: Nasopharyngeal Swab;  Nasopharyngeal(NP) swabs in vial transport medium  Result Value Ref Range Status   SARS Coronavirus 2 by RT PCR NEGATIVE NEGATIVE Final    Comment: (NOTE) SARS-CoV-2 target nucleic acids are NOT DETECTED.  The SARS-CoV-2 RNA is generally detectable in upper respiratory specimens during the acute phase of infection. The lowest concentration of SARS-CoV-2 viral copies this assay can detect is 138 copies/mL. A negative result does not preclude SARS-Cov-2 infection and should not be used as the sole basis for treatment or other patient management decisions. A negative result may occur with  improper specimen collection/handling, submission of specimen other than nasopharyngeal swab, presence of viral mutation(s) within the areas targeted by this assay, and inadequate number of viral copies(<138 copies/mL). A negative result must be combined with clinical observations, patient history, and epidemiological information. The expected result is Negative.  Fact Sheet for Patients:  EntrepreneurPulse.com.au  Fact Sheet for Healthcare Providers:  IncredibleEmployment.be  This test is no t yet approved or cleared by the [place] FDA and  has been authorized for detection and/or diagnosis of SARS-CoV-2 by FDA under an Emergency Use Authorization (EUA). This EUA will remain  in effect (meaning this test can be used) for the duration of the COVID-19 declaration under Section 564(b)(1) of the Act, 21 U.S.C.section 360bbb-3(b)(1), unless the authorization is terminated  or revoked sooner.       Influenza A by PCR NEGATIVE NEGATIVE Final   Influenza B by PCR NEGATIVE NEGATIVE Final    Comment: (NOTE) The Xpert Xpress SARS-CoV-2/FLU/RSV plus assay is intended as an aid in the diagnosis of influenza from Nasopharyngeal swab specimens and should not be used as a sole basis for treatment. Nasal washings and aspirates are unacceptable for Xpert Xpress  SARS-CoV-2/FLU/RSV testing.  Fact Sheet for Patients: EntrepreneurPulse.com.au  Fact Sheet for Healthcare Providers: IncredibleEmployment.be  This test is not yet approved or cleared by the [place] FDA and has been authorized for detection and/or diagnosis of SARS-CoV-2 by FDA under an Emergency Use Authorization (EUA). This EUA will remain in effect (meaning this test can be used) for the duration of the COVID-19 declaration under Section 564(b)(1) of the Act, 21 U.S.C. section 360bbb-3(b)(1), unless the authorization is terminated or revoked.  Performed at [facility], [place], [place] [address]   C Difficile Quick Screen w PCR reflex     Status: None   Collection Time: 04/12/21  2:16 AM   Specimen: STOOL  Result Value Ref Range Status   C Diff antigen NEGATIVE NEGATIVE Final   C Diff toxin NEGATIVE NEGATIVE Final   C Diff interpretation No C. difficile detected.  Final    Comment: Performed  at [facility], [address] [place], [place] [address]  Gastrointestinal Panel by PCR , Stool     Status: None   Collection Time: 04/12/21  2:16 AM   Specimen: Stool  Result Value Ref Range Status   Campylobacter species NOT DETECTED NOT DETECTED Final   Plesimonas shigelloides NOT DETECTED NOT DETECTED Final   Salmonella species NOT DETECTED NOT DETECTED Final   Yersinia enterocolitica NOT DETECTED NOT DETECTED Final   Vibrio species NOT DETECTED NOT DETECTED Final   Vibrio cholerae NOT DETECTED NOT DETECTED Final   Enteroaggregative E coli (EAEC) NOT DETECTED NOT DETECTED Final   Enteropathogenic E coli (EPEC) NOT DETECTED NOT DETECTED Final   Enterotoxigenic E coli (ETEC) NOT DETECTED NOT DETECTED Final   Shiga like toxin producing E coli (STEC) NOT DETECTED NOT DETECTED Final   Shigella/Enteroinvasive E coli (EIEC) NOT DETECTED NOT DETECTED Final   Cryptosporidium NOT DETECTED NOT DETECTED Final    Cyclospora cayetanensis NOT DETECTED NOT DETECTED Final   Entamoeba histolytica NOT DETECTED NOT DETECTED Final   Giardia lamblia NOT DETECTED NOT DETECTED Final   Adenovirus F40/41 NOT DETECTED NOT DETECTED Final   Astrovirus NOT DETECTED NOT DETECTED Final   Norovirus GI/GII NOT DETECTED NOT DETECTED Final   Rotavirus A NOT DETECTED NOT DETECTED Final   Sapovirus (I, II, IV, and V) NOT DETECTED NOT DETECTED Final    Comment: Performed at [name], [address] [place], [place] [address]    Coagulation Studies: No results for input(s): "LABPROT", "INR" in the last 72 hours.  Urinalysis: No results for input(s): "COLORURINE", "LABSPEC", "PHURINE", "GLUCOSEU", "HGBUR", "BILIRUBINUR", "KETONESUR", "PROTEINUR", "UROBILINOGEN", "NITRITE", "LEUKOCYTESUR" in the last 72 hours.  Invalid input(s): "APPERANCEUR"    Imaging: No results found.   Medications:    sodium chloride      Chlorhexidine Gluconate Cloth  6 each Topical Q0600   gabapentin  300 mg Oral BID   insulin aspart  0-5 Units Subcutaneous QHS   insulin aspart  0-6 Units Subcutaneous TID WC   pantoprazole  40 mg Intravenous Q12H   acetaminophen, HYDROmorphone (DILAUDID) injection, mouth rinse, traMADol  Assessment/ Plan:  Mr. Jerry [name] is a 57 y.o.  male with past medical history of essential hypertension, diabetes, and end stage renal disease on hemodialysis. Patient presented to the emergency department complaining of cough and diarrhea. Patient has been admitted for Hematochezia [K92.1] Hyperkalemia [E87.5] GI bleed [K92.2] Coffee ground emesis [K92.0] Diarrhea, unspecified type [R19.7] Nausea and vomiting, unspecified vomiting type [R11.2]  [facility]/MWF/left aVF/78.5 kg  Hyperkalemia with end-stage renal disease on hemodialysis.  Will maintain outpatient schedule if possible.  Potassium resolved with dialysis. Receiving dialysis with UF goal 1L as tolerated. Next treatment  scheduled for Wednesday.   2. Anemia of chronic kidney disease with acute blood loss: hemoglobin 8.5.  Lab Results  Component Value Date   HGB 8.1 (L) 07/23/2022  Coffee-ground emesis and melena noted on ED arrival.  Multiple blood transfusions this admission.  EGD showed nonbleeding gastric ulcers, gastritis, esophagitis, Mallory-Weiss tear.  Avoid nonsteroidals. Hemoglobin slowly improving.   3. Secondary Hyperparathyroidism: with outpatient labs: PTH 65, phosphorus 5.4, calcium 9.9 on 05/22/22.   Lab Results  Component Value Date   CALCIUM 8.5 (L) 07/23/2022   PHOS 3.6 04/13/2021    Calcium within desired range. All binders remain held  4.  Hypertension with chronic kidney disease.  Home regimen includes hydralazine, currently held.  Blood pressure 149/67 during dialysis.  LOS: [place] 10/9/202312:15 PM

## 2022-07-23 NOTE — Discharge Summary (Signed)
Physician Discharge Summary   Patient: Jerry [name] MRN: [id number] DOB: [birth year]  Admit date:     07/19/2022  Discharge date: 07/23/22  Discharge Physician: Ezekiel Slocumb   PCP: Freddy Finner, NP   Recommendations at discharge:   Follow up with Gastroenterology in about 8 weeks Repeat CBC in 1 week Follow up with Primary care in 1-2 weeks Follow up with Nephrology and for dialysis per usual schedule   Discharge Diagnoses: Active Problems:   ABLA (acute blood loss anemia)   Hyponatremia   Hyperkalemia   ESRD on hemodialysis (HCC)   Diabetes mellitus ([place])   Hypertension   Chronic back pain   Thrombocytopenia (HCC)  Principal Problem (Resolved):   GI bleed  Hospital Course: Jerry [name] is a 56 y.o. male with medical history significant of end-stage renal disease on hemodialysis, diabetes, essential hypertension, who came in from home on evening of 07/19/2022 for evaluation of diarrhea (melena) since the day before as well as generalized weakness.  Patient started having vomiting in the ER which was coffee-ground emesis.  Admitted to the hospital with acute blood loss anemia with initial Hbg of 5.0, elevated potassium > 7.  Two units pRBC's were ordered for transfusion on admission.  Being hemodynamically stable, pt was admitted to hospitalist service, placed in stepdown unit for close monitoring.  Gastroenterology consulted for endoscopic evaluation.  Nephrology consulted to continue hemodialysis.     Assessment and Plan: * GI bleed-resolved as of 07/23/2022 Pt was having melena at home, in ED had coffee-ground emesis, severely anemic. --GI consulted --EGD today showed non-bleeding gastric ulcers with clean bases, gastritis --Diet advanced and pt tolerating, Hbg improving --No further melenotic stools --Follow up biopsy path --See ABLA --Treated with PPI drip >> IV PPI BID --D/c on Protonix 40 mg PO BID x 8 weeks --No NSAID's or ASA --Repeat  EGD in 8 weeks --GI follow up as scheduled --Spanish info sheet regarding diet for peptic ulcer disease included in AVS and discussed in detail  ABLA (acute blood loss anemia) Hbg on admission 5.0>>4.7, due to GI bleeding with melena and coffee-ground emesis in the ED. status post transfusion of 3 units PRBCs on 10/6. Hemoglobin ...7.9 >> 7.8 >> 7.7 >> 8.1 --See GI Bleed  Hyperkalemia K was 6.7 on admission, in setting of ESRD and active upper GI bleed.  Treated in the ED with IV insulin +dextrose, improved to K 3.6. --Monitor BMP --Continue dialysis  Hyponatremia Mild, Na 133 on admission, normalized. Recurrent, likely related to volume status, given ESRD, suspect intermittent hypervolemic hyponatremia.  Monitor BMP  ESRD on hemodialysis [facility]) Nephrology following for dilaysis  Diabetes mellitus ([place]) On sliding scale Novolog for now Resume home regimen at d/c  Hypertension BP's stable despite GI bleed.  Soft DBP's. Hold antihypertensives for now. Maintain MAP>65  Thrombocytopenia (HCC) Platelets on admission 111k, slowly trended down, 81 >> 74k today. --Avoid heparin products --SCD's  Chronic back pain Resume home tramadol and gabapentin. Low-dose IV Dilaudid as needed for breakthrough pain         Consultants: Gastroenterology, nephrology Procedures performed: EGD  Disposition: Home Diet recommendation:  Discharge Diet Orders (From admission, onward)     Start     Ordered   07/23/22 0000  Diet - low sodium heart healthy        07/23/22 0849           Cardiac and Carb modified diet DISCHARGE MEDICATION: Allergies as of 07/23/2022   No Known Allergies  Medication List     TAKE these medications    acetaminophen 650 MG CR tablet Commonly known as: TYLENOL Take 1,300 mg by mouth 2 (two) times daily as needed for pain.   calcium carbonate 500 MG chewable tablet Commonly known as: TUMS - dosed in mg elemental calcium Chew 3 tablets by  mouth 3 (three) times daily. With meals   DAYQUIL MULTI-SYMPTOM PO Take 15 mLs by mouth daily as needed (cough).   gabapentin 300 MG capsule Commonly known as: NEURONTIN Take 1 capsule (300 mg total) by mouth every Monday, Wednesday, and Friday at 6 PM. After dialysis   pantoprazole 40 MG tablet Commonly known as: Protonix Take 1 tablet (40 mg total) by mouth 2 (two) times daily.   Renvela 800 MG tablet Generic drug: sevelamer carbonate Take 1,600 mg by mouth 2 (two) times daily. After meals   traMADol 50 MG tablet Commonly known as: ULTRAM Take 100 mg by mouth every 12 (twelve) hours as needed. In the morning and evening.        Discharge Exam: Filed Weights   07/23/22 0931 07/23/22 1231 07/23/22 1234  Weight: 76.5 kg 75.8 kg 74.8 kg   General exam: sleeping in dialysis, woke briefly, no acute distress HEENT: eyes closed, moist mucus membranes, hearing grossly normal  Respiratory system: on room air, normal respiratory effort. Cardiovascular system: RRR, no pedal edema.   Central nervous system: no gross focal neurologic deficits, normal speech Extremities: LUE fistula accessed, no peripheral edema   Condition at discharge: stable  The results of significant diagnostics from this hospitalization (including imaging, microbiology, ancillary and laboratory) are listed below for reference.   Imaging Studies: DG Chest 2 View  Result Date: 07/19/2022 CLINICAL DATA:  Cough and diarrhea. EXAM: CHEST - 2 VIEW COMPARISON:  April 11, 2021 FINDINGS: The cardiac silhouette is moderately enlarged and unchanged in size. There is mild to moderate severity calcification of the aortic arch. Mild to moderate severity diffusely increased interstitial lung markings are seen. Mild superimposed areas of atelectasis and/or infiltrate are noted along the periphery of the mid and lower lung fields, bilaterally. There is no evidence of a pleural effusion or pneumothorax. The visualized skeletal  structures are unremarkable. IMPRESSION: 1. Cardiomegaly with mild to moderate severity increased interstitial lung markings. 2. Mild superimposed areas of mid and lower lung field atelectasis and/or infiltrate. Electronically Signed   By: Virgina Norfolk M.D.   On: 07/19/2022 19:52    Microbiology: Results for orders placed or performed during the hospital encounter of 04/11/21  Resp Panel by RT-PCR (Flu A&B, Covid) Nasopharyngeal Swab     Status: None   Collection Time: 04/11/21  6:03 PM   Specimen: Nasopharyngeal Swab; Nasopharyngeal(NP) swabs in vial transport medium  Result Value Ref Range Status   SARS Coronavirus 2 by RT PCR NEGATIVE NEGATIVE Final    Comment: (NOTE) SARS-CoV-2 target nucleic acids are NOT DETECTED.  The SARS-CoV-2 RNA is generally detectable in upper respiratory specimens during the acute phase of infection. The lowest concentration of SARS-CoV-2 viral copies this assay can detect is 138 copies/mL. A negative result does not preclude SARS-Cov-2 infection and should not be used as the sole basis for treatment or other patient management decisions. A negative result may occur with  improper specimen collection/handling, submission of specimen other than nasopharyngeal swab, presence of viral mutation(s) within the areas targeted by this assay, and inadequate number of viral copies(<138 copies/mL). A negative result must be combined with clinical observations, patient  history, and epidemiological information. The expected result is Negative.  Fact Sheet for Patients:  EntrepreneurPulse.com.au  Fact Sheet for Healthcare Providers:  IncredibleEmployment.be  This test is no t yet approved or cleared by the [place] FDA and  has been authorized for detection and/or diagnosis of SARS-CoV-2 by FDA under an Emergency Use Authorization (EUA). This EUA will remain  in effect (meaning this test can be used) for the duration of  the COVID-19 declaration under Section 564(b)(1) of the Act, 21 U.S.C.section 360bbb-3(b)(1), unless the authorization is terminated  or revoked sooner.       Influenza A by PCR NEGATIVE NEGATIVE Final   Influenza B by PCR NEGATIVE NEGATIVE Final    Comment: (NOTE) The Xpert Xpress SARS-CoV-2/FLU/RSV plus assay is intended as an aid in the diagnosis of influenza from Nasopharyngeal swab specimens and should not be used as a sole basis for treatment. Nasal washings and aspirates are unacceptable for Xpert Xpress SARS-CoV-2/FLU/RSV testing.  Fact Sheet for Patients: EntrepreneurPulse.com.au  Fact Sheet for Healthcare Providers: IncredibleEmployment.be  This test is not yet approved or cleared by the [place] FDA and has been authorized for detection and/or diagnosis of SARS-CoV-2 by FDA under an Emergency Use Authorization (EUA). This EUA will remain in effect (meaning this test can be used) for the duration of the COVID-19 declaration under Section 564(b)(1) of the Act, 21 U.S.C. section 360bbb-3(b)(1), unless the authorization is terminated or revoked.  Performed at [facility], [place], [place] [address]   C Difficile Quick Screen w PCR reflex     Status: None   Collection Time: 04/12/21  2:16 AM   Specimen: STOOL  Result Value Ref Range Status   C Diff antigen NEGATIVE NEGATIVE Final   C Diff toxin NEGATIVE NEGATIVE Final   C Diff interpretation No C. difficile detected.  Final    Comment: Performed at [facility], [address] [place], [place] [address]  Gastrointestinal Panel by PCR , Stool     Status: None   Collection Time: 04/12/21  2:16 AM   Specimen: Stool  Result Value Ref Range Status   Campylobacter species NOT DETECTED NOT DETECTED Final   Plesimonas shigelloides NOT DETECTED NOT DETECTED Final   Salmonella species NOT DETECTED NOT DETECTED Final   Yersinia enterocolitica NOT  DETECTED NOT DETECTED Final   Vibrio species NOT DETECTED NOT DETECTED Final   Vibrio cholerae NOT DETECTED NOT DETECTED Final   Enteroaggregative E coli (EAEC) NOT DETECTED NOT DETECTED Final   Enteropathogenic E coli (EPEC) NOT DETECTED NOT DETECTED Final   Enterotoxigenic E coli (ETEC) NOT DETECTED NOT DETECTED Final   Shiga like toxin producing E coli (STEC) NOT DETECTED NOT DETECTED Final   Shigella/Enteroinvasive E coli (EIEC) NOT DETECTED NOT DETECTED Final   Cryptosporidium NOT DETECTED NOT DETECTED Final   Cyclospora cayetanensis NOT DETECTED NOT DETECTED Final   Entamoeba histolytica NOT DETECTED NOT DETECTED Final   Giardia lamblia NOT DETECTED NOT DETECTED Final   Adenovirus F40/41 NOT DETECTED NOT DETECTED Final   Astrovirus NOT DETECTED NOT DETECTED Final   Norovirus GI/GII NOT DETECTED NOT DETECTED Final   Rotavirus A NOT DETECTED NOT DETECTED Final   Sapovirus (I, II, IV, and V) NOT DETECTED NOT DETECTED Final    Comment: Performed at [facility], [address] [place], [place] [address]    Labs: CBC: Recent Labs  Lab 07/19/22 1919 07/20/22 0601 07/21/22 1052 07/21/22 1650 07/21/22 2339 07/22/22 3716  07/22/22 1308 07/23/22 0552  WBC 5.3  5.3   < > 4.3 4.4 4.1 4.9  --  4.8  NEUTROABS 4.3  --   --   --   --   --   --   --   HGB 5.0*  5.0*   < > 7.6* 7.4* 7.9* 7.8* 7.7* 8.1*  HCT 15.6*  15.4*   < > 21.7* 21.9* 23.6* 23.4* 23.2* 24.1*  MCV 102.6*  102.7*   < > 91.6 94.0 94.8 95.9  --  96.4  PLT 106*  111*   < > 81* 79* 81* 74*  --  78*   < > = values in this interval not displayed.   Basic Metabolic Panel: Recent Labs  Lab 07/19/22 1919 07/20/22 0240 07/20/22 0601 07/21/22 0544 07/22/22 0711 07/23/22 0552  NA 133*  --  136 135 129* 130*  K 6.7* 3.6 3.7 5.0 3.9 4.4  CL 96*  --  98 98 92* 91*  CO2 24  --  28 25 28 26   GLUCOSE 210*  --  93 130* 94 145*  BUN 86*  --  53* 73* 39* 51*  CREATININE 7.92*  --  4.04* 6.74* 5.23* 7.85*   CALCIUM 8.9  --  8.3* 8.7* 8.6* 8.5*   Liver Function Tests: Recent Labs  Lab 07/19/22 1919 07/20/22 0601  AST 31 23  ALT 33 29  ALKPHOS 221* 174*  BILITOT 1.0 1.0  PROT 6.8 6.0*  ALBUMIN 3.0* 2.7*   CBG: Recent Labs  Lab 07/22/22 1152 07/22/22 1643 07/22/22 2056 07/23/22 0757 07/23/22 1307  GLUCAP 137* 129* 156* 123* 163*    Discharge time spent: less than 30 minutes.  Signed: Ezekiel Slocumb, DO [facility] 07/23/2022

## 2022-07-23 NOTE — Plan of Care (Signed)
IV removed.  Interpreter used to explain medication and discharge instructions.  Discharge instructions given to patient in both [place] and [place]. Walker for home use given to patient. Patient to be discharged with daughter to home.

## 2022-07-24 LAB — SURGICAL PATHOLOGY

## 2022-07-24 LAB — PREPARE RBC (CROSSMATCH)

## 2022-07-27 ENCOUNTER — Encounter: Payer: Self-pay | Admitting: Primary Care

## 2022-09-10 ENCOUNTER — Other Ambulatory Visit: Payer: Self-pay

## 2022-09-10 ENCOUNTER — Emergency Department: Payer: Medicaid Other

## 2022-09-10 DIAGNOSIS — J8489 Other specified interstitial pulmonary diseases: Secondary | ICD-10-CM | POA: Diagnosis present

## 2022-09-10 DIAGNOSIS — I480 Paroxysmal atrial fibrillation: Secondary | ICD-10-CM | POA: Diagnosis not present

## 2022-09-10 DIAGNOSIS — Z79899 Other long term (current) drug therapy: Secondary | ICD-10-CM

## 2022-09-10 DIAGNOSIS — I161 Hypertensive emergency: Secondary | ICD-10-CM | POA: Diagnosis present

## 2022-09-10 DIAGNOSIS — M25562 Pain in left knee: Secondary | ICD-10-CM | POA: Diagnosis present

## 2022-09-10 DIAGNOSIS — Z992 Dependence on renal dialysis: Secondary | ICD-10-CM

## 2022-09-10 DIAGNOSIS — T380X5A Adverse effect of glucocorticoids and synthetic analogues, initial encounter: Secondary | ICD-10-CM | POA: Diagnosis present

## 2022-09-10 DIAGNOSIS — K259 Gastric ulcer, unspecified as acute or chronic, without hemorrhage or perforation: Secondary | ICD-10-CM | POA: Diagnosis present

## 2022-09-10 DIAGNOSIS — Z515 Encounter for palliative care: Secondary | ICD-10-CM

## 2022-09-10 DIAGNOSIS — I4510 Unspecified right bundle-branch block: Secondary | ICD-10-CM | POA: Diagnosis present

## 2022-09-10 DIAGNOSIS — N186 End stage renal disease: Secondary | ICD-10-CM | POA: Diagnosis present

## 2022-09-10 DIAGNOSIS — Z8719 Personal history of other diseases of the digestive system: Secondary | ICD-10-CM

## 2022-09-10 DIAGNOSIS — G8929 Other chronic pain: Secondary | ICD-10-CM | POA: Diagnosis present

## 2022-09-10 DIAGNOSIS — M25561 Pain in right knee: Secondary | ICD-10-CM | POA: Diagnosis present

## 2022-09-10 DIAGNOSIS — I951 Orthostatic hypotension: Secondary | ICD-10-CM | POA: Diagnosis present

## 2022-09-10 DIAGNOSIS — I5043 Acute on chronic combined systolic (congestive) and diastolic (congestive) heart failure: Secondary | ICD-10-CM | POA: Diagnosis present

## 2022-09-10 DIAGNOSIS — E663 Overweight: Secondary | ICD-10-CM | POA: Diagnosis present

## 2022-09-10 DIAGNOSIS — H547 Unspecified visual loss: Secondary | ICD-10-CM | POA: Diagnosis present

## 2022-09-10 DIAGNOSIS — Z6829 Body mass index (BMI) 29.0-29.9, adult: Secondary | ICD-10-CM

## 2022-09-10 DIAGNOSIS — Z841 Family history of disorders of kidney and ureter: Secondary | ICD-10-CM

## 2022-09-10 DIAGNOSIS — K7210 Chronic hepatic failure without coma: Secondary | ICD-10-CM | POA: Diagnosis present

## 2022-09-10 DIAGNOSIS — E1165 Type 2 diabetes mellitus with hyperglycemia: Secondary | ICD-10-CM | POA: Diagnosis present

## 2022-09-10 DIAGNOSIS — K7031 Alcoholic cirrhosis of liver with ascites: Secondary | ICD-10-CM | POA: Diagnosis present

## 2022-09-10 DIAGNOSIS — E1122 Type 2 diabetes mellitus with diabetic chronic kidney disease: Secondary | ICD-10-CM | POA: Diagnosis present

## 2022-09-10 DIAGNOSIS — M545 Low back pain, unspecified: Secondary | ICD-10-CM | POA: Diagnosis present

## 2022-09-10 DIAGNOSIS — Z83511 Family history of glaucoma: Secondary | ICD-10-CM

## 2022-09-10 DIAGNOSIS — Z66 Do not resuscitate: Secondary | ICD-10-CM | POA: Diagnosis not present

## 2022-09-10 DIAGNOSIS — D696 Thrombocytopenia, unspecified: Secondary | ICD-10-CM | POA: Diagnosis not present

## 2022-09-10 DIAGNOSIS — J189 Pneumonia, unspecified organism: Secondary | ICD-10-CM | POA: Diagnosis present

## 2022-09-10 DIAGNOSIS — N2581 Secondary hyperparathyroidism of renal origin: Secondary | ICD-10-CM | POA: Diagnosis present

## 2022-09-10 DIAGNOSIS — I132 Hypertensive heart and chronic kidney disease with heart failure and with stage 5 chronic kidney disease, or end stage renal disease: Principal | ICD-10-CM | POA: Diagnosis present

## 2022-09-10 DIAGNOSIS — Z20822 Contact with and (suspected) exposure to covid-19: Secondary | ICD-10-CM | POA: Diagnosis present

## 2022-09-10 DIAGNOSIS — D631 Anemia in chronic kidney disease: Secondary | ICD-10-CM | POA: Diagnosis present

## 2022-09-10 DIAGNOSIS — J9601 Acute respiratory failure with hypoxia: Secondary | ICD-10-CM | POA: Diagnosis present

## 2022-09-10 DIAGNOSIS — K2970 Gastritis, unspecified, without bleeding: Secondary | ICD-10-CM | POA: Diagnosis present

## 2022-09-10 DIAGNOSIS — E872 Acidosis, unspecified: Secondary | ICD-10-CM | POA: Diagnosis present

## 2022-09-10 LAB — CBC
HCT: 28.9 % — ABNORMAL LOW (ref 39.0–52.0)
Hemoglobin: 10.3 g/dL — ABNORMAL LOW (ref 13.0–17.0)
MCH: 34.2 pg — ABNORMAL HIGH (ref 26.0–34.0)
MCHC: 35.6 g/dL (ref 30.0–36.0)
MCV: 96 fL (ref 80.0–100.0)
Platelets: 200 10*3/uL (ref 150–400)
RBC: 3.01 MIL/uL — ABNORMAL LOW (ref 4.22–5.81)
RDW: 19.4 % — ABNORMAL HIGH (ref 11.5–15.5)
WBC: 8 10*3/uL (ref 4.0–10.5)
nRBC: 0 % (ref 0.0–0.2)

## 2022-09-10 LAB — BASIC METABOLIC PANEL
Anion gap: 18 — ABNORMAL HIGH (ref 5–15)
BUN: 66 mg/dL — ABNORMAL HIGH (ref 6–20)
CO2: 21 mmol/L — ABNORMAL LOW (ref 22–32)
Calcium: 9.4 mg/dL (ref 8.9–10.3)
Chloride: 96 mmol/L — ABNORMAL LOW (ref 98–111)
Creatinine, Ser: 6.71 mg/dL — ABNORMAL HIGH (ref 0.61–1.24)
GFR, Estimated: 9 mL/min — ABNORMAL LOW (ref >60)
Glucose, Bld: 126 mg/dL — ABNORMAL HIGH (ref 70–99)
Potassium: 4.2 mmol/L (ref 3.5–5.1)
Sodium: 135 mmol/L (ref 135–145)

## 2022-09-10 LAB — BRAIN NATRIURETIC PEPTIDE: B Natriuretic Peptide: 3275.5 pg/mL — ABNORMAL HIGH (ref 0.0–100.0)

## 2022-09-10 LAB — TROPONIN I (HIGH SENSITIVITY): Troponin I (High Sensitivity): 62 ng/L — ABNORMAL HIGH (ref <18)

## 2022-09-10 NOTE — ED Notes (Addendum)
First Nurse Note: Patient to ED via [facility] from dialysis for increased weakness and SOB over the past week. Patient received a full treatment today.  185/87 74 HR 97.5 99% 2L- placed on [place] for comfort per EMS ( initially 93% on RA)

## 2022-09-10 NOTE — ED Triage Notes (Addendum)
Per First Nurse patient arrived by EMS for weakness over past two weeks. Patient coming from dialysis  on 2L [place].  Patient is spanish speaking. Appears in no distress at this time. Patient complains of chest pain lower back pain and knee pain.  Lung sounds clear bil.

## 2022-09-10 NOTE — ED Notes (Signed)
OXYGEN TANK CHANGED OUT AT THIS TIME AND 2L OXYGEN RE-INITIATED BY NASAL CANULA.

## 2022-09-10 NOTE — ED Provider Triage Note (Signed)
Emergency Medicine Provider Triage Evaluation Note  Jerry [name] , a 56 y.o. male  was evaluated in triage.  Pt complains of generalized weakness, chest pain and right knee pain while at dialysis.  Patient was able to finish dialysis before coming to the emergency department.  Patient requires Patent attorney.  Review of Systems  Positive: Patient has weakness, chest pain and right knee pain.  Negative: No numbness or tingling.   Physical Exam  BP (!) 173/87 (BP Location: Right Arm)   Pulse 75   Temp 98.3 F (36.8 C) (Oral)   Resp 20   Ht 5\' 6"  (1.676 m)   Wt 78 kg   SpO2 96%   BMI 27.75 kg/m  Gen:   Awake, no distress   Resp:  Normal effort  MSK:   Moves extremities without difficulty  Other:    Medical Decision Making  Medically screening exam initiated at 3:47 PM.  Appropriate orders placed.  Jerry [name] was informed that the remainder of the evaluation will be completed by another provider, this initial triage assessment does not replace that evaluation, and the importance of remaining in the ED until their evaluation is complete.     Vallarie Mare [place], [place] 09/10/22 1551

## 2022-09-11 ENCOUNTER — Inpatient Hospital Stay: Payer: Medicaid Other

## 2022-09-11 ENCOUNTER — Inpatient Hospital Stay (HOSPITAL_COMMUNITY)
Admit: 2022-09-11 | Discharge: 2022-09-11 | Disposition: A | Discharge status: Home / Self Care | Payer: Medicaid Other | Attending: Internal Medicine | Admitting: Internal Medicine

## 2022-09-11 ENCOUNTER — Inpatient Hospital Stay
Admission: EM | Admit: 2022-09-11 | Discharge: 2022-09-23 | DRG: 291 | Disposition: A | Discharge status: Hospice | Payer: Medicaid Other | Attending: Internal Medicine | Admitting: Internal Medicine

## 2022-09-11 DIAGNOSIS — J8489 Other specified interstitial pulmonary diseases: Secondary | ICD-10-CM | POA: Diagnosis present

## 2022-09-11 DIAGNOSIS — Z20822 Contact with and (suspected) exposure to covid-19: Secondary | ICD-10-CM | POA: Diagnosis present

## 2022-09-11 DIAGNOSIS — R0600 Dyspnea, unspecified: Secondary | ICD-10-CM

## 2022-09-11 DIAGNOSIS — J189 Pneumonia, unspecified organism: Secondary | ICD-10-CM | POA: Diagnosis present

## 2022-09-11 DIAGNOSIS — G8929 Other chronic pain: Secondary | ICD-10-CM | POA: Diagnosis not present

## 2022-09-11 DIAGNOSIS — K279 Peptic ulcer, site unspecified, unspecified as acute or chronic, without hemorrhage or perforation: Secondary | ICD-10-CM | POA: Insufficient documentation

## 2022-09-11 DIAGNOSIS — J849 Interstitial pulmonary disease, unspecified: Secondary | ICD-10-CM | POA: Diagnosis not present

## 2022-09-11 DIAGNOSIS — D696 Thrombocytopenia, unspecified: Secondary | ICD-10-CM | POA: Diagnosis not present

## 2022-09-11 DIAGNOSIS — E119 Type 2 diabetes mellitus without complications: Secondary | ICD-10-CM

## 2022-09-11 DIAGNOSIS — I132 Hypertensive heart and chronic kidney disease with heart failure and with stage 5 chronic kidney disease, or end stage renal disease: Secondary | ICD-10-CM | POA: Diagnosis present

## 2022-09-11 DIAGNOSIS — K7031 Alcoholic cirrhosis of liver with ascites: Secondary | ICD-10-CM | POA: Diagnosis present

## 2022-09-11 DIAGNOSIS — I5043 Acute on chronic combined systolic (congestive) and diastolic (congestive) heart failure: Secondary | ICD-10-CM

## 2022-09-11 DIAGNOSIS — R531 Weakness: Secondary | ICD-10-CM | POA: Diagnosis not present

## 2022-09-11 DIAGNOSIS — N186 End stage renal disease: Secondary | ICD-10-CM | POA: Diagnosis present

## 2022-09-11 DIAGNOSIS — Z515 Encounter for palliative care: Secondary | ICD-10-CM | POA: Diagnosis not present

## 2022-09-11 DIAGNOSIS — J810 Acute pulmonary edema: Principal | ICD-10-CM

## 2022-09-11 DIAGNOSIS — Z6829 Body mass index (BMI) 29.0-29.9, adult: Secondary | ICD-10-CM | POA: Diagnosis not present

## 2022-09-11 DIAGNOSIS — D638 Anemia in other chronic diseases classified elsewhere: Secondary | ICD-10-CM | POA: Diagnosis not present

## 2022-09-11 DIAGNOSIS — E8770 Fluid overload, unspecified: Secondary | ICD-10-CM | POA: Diagnosis present

## 2022-09-11 DIAGNOSIS — K7460 Unspecified cirrhosis of liver: Secondary | ICD-10-CM | POA: Diagnosis not present

## 2022-09-11 DIAGNOSIS — N2581 Secondary hyperparathyroidism of renal origin: Secondary | ICD-10-CM | POA: Diagnosis present

## 2022-09-11 DIAGNOSIS — Z789 Other specified health status: Secondary | ICD-10-CM | POA: Diagnosis not present

## 2022-09-11 DIAGNOSIS — M549 Dorsalgia, unspecified: Secondary | ICD-10-CM | POA: Diagnosis not present

## 2022-09-11 DIAGNOSIS — I161 Hypertensive emergency: Secondary | ICD-10-CM | POA: Diagnosis present

## 2022-09-11 DIAGNOSIS — J9601 Acute respiratory failure with hypoxia: Secondary | ICD-10-CM | POA: Diagnosis present

## 2022-09-11 DIAGNOSIS — D631 Anemia in chronic kidney disease: Secondary | ICD-10-CM | POA: Diagnosis present

## 2022-09-11 DIAGNOSIS — Z992 Dependence on renal dialysis: Secondary | ICD-10-CM

## 2022-09-11 DIAGNOSIS — I5031 Acute diastolic (congestive) heart failure: Secondary | ICD-10-CM | POA: Diagnosis not present

## 2022-09-11 DIAGNOSIS — I480 Paroxysmal atrial fibrillation: Secondary | ICD-10-CM | POA: Diagnosis not present

## 2022-09-11 DIAGNOSIS — Z66 Do not resuscitate: Secondary | ICD-10-CM | POA: Diagnosis not present

## 2022-09-11 DIAGNOSIS — I951 Orthostatic hypotension: Secondary | ICD-10-CM

## 2022-09-11 DIAGNOSIS — E1165 Type 2 diabetes mellitus with hyperglycemia: Secondary | ICD-10-CM | POA: Diagnosis present

## 2022-09-11 DIAGNOSIS — E1122 Type 2 diabetes mellitus with diabetic chronic kidney disease: Secondary | ICD-10-CM | POA: Diagnosis present

## 2022-09-11 DIAGNOSIS — R7989 Other specified abnormal findings of blood chemistry: Secondary | ICD-10-CM

## 2022-09-11 DIAGNOSIS — T380X5A Adverse effect of glucocorticoids and synthetic analogues, initial encounter: Secondary | ICD-10-CM | POA: Diagnosis present

## 2022-09-11 DIAGNOSIS — E8729 Other acidosis: Secondary | ICD-10-CM

## 2022-09-11 DIAGNOSIS — R188 Other ascites: Secondary | ICD-10-CM | POA: Diagnosis not present

## 2022-09-11 DIAGNOSIS — E872 Acidosis, unspecified: Secondary | ICD-10-CM | POA: Diagnosis present

## 2022-09-11 DIAGNOSIS — K259 Gastric ulcer, unspecified as acute or chronic, without hemorrhage or perforation: Secondary | ICD-10-CM | POA: Diagnosis present

## 2022-09-11 DIAGNOSIS — E663 Overweight: Secondary | ICD-10-CM | POA: Diagnosis present

## 2022-09-11 DIAGNOSIS — K7210 Chronic hepatic failure without coma: Secondary | ICD-10-CM | POA: Diagnosis present

## 2022-09-11 LAB — BASIC METABOLIC PANEL
Anion gap: 15 (ref 5–15)
BUN: 83 mg/dL — ABNORMAL HIGH (ref 6–20)
CO2: 20 mmol/L — ABNORMAL LOW (ref 22–32)
Calcium: 9 mg/dL (ref 8.9–10.3)
Chloride: 95 mmol/L — ABNORMAL LOW (ref 98–111)
Creatinine, Ser: 8.35 mg/dL — ABNORMAL HIGH (ref 0.61–1.24)
GFR, Estimated: 7 mL/min — ABNORMAL LOW (ref >60)
Glucose, Bld: 165 mg/dL — ABNORMAL HIGH (ref 70–99)
Potassium: 4.8 mmol/L (ref 3.5–5.1)
Sodium: 130 mmol/L — ABNORMAL LOW (ref 135–145)

## 2022-09-11 LAB — ECHOCARDIOGRAM COMPLETE
AR max vel: 1.62 cm2
AV Area VTI: 1.83 cm2
AV Area mean vel: 1.61 cm2
AV Mean grad: 6.3 mmHg
AV Peak grad: 10.8 mmHg
Ao pk vel: 1.64 m/s
Area-P 1/2: 3.63 cm2
MV VTI: 1.16 cm2
S' Lateral: 3.9 cm

## 2022-09-11 LAB — PROCALCITONIN: Procalcitonin: 2.28 ng/mL

## 2022-09-11 LAB — CBC
HCT: 26.5 % — ABNORMAL LOW (ref 39.0–52.0)
Hemoglobin: 9.2 g/dL — ABNORMAL LOW (ref 13.0–17.0)
MCH: 33.7 pg (ref 26.0–34.0)
MCHC: 34.7 g/dL (ref 30.0–36.0)
MCV: 97.1 fL (ref 80.0–100.0)
Platelets: 185 10*3/uL (ref 150–400)
RBC: 2.73 MIL/uL — ABNORMAL LOW (ref 4.22–5.81)
RDW: 19.4 % — ABNORMAL HIGH (ref 11.5–15.5)
WBC: 7.8 10*3/uL (ref 4.0–10.5)
nRBC: 0 % (ref 0.0–0.2)

## 2022-09-11 LAB — BLOOD GAS, VENOUS
Acid-base deficit: 2.6 mmol/L — ABNORMAL HIGH (ref 0.0–2.0)
Bicarbonate: 21.8 mmol/L (ref 20.0–28.0)
O2 Saturation: 76.5 %
Patient temperature: 37
pCO2, Ven: 36 mmHg — ABNORMAL LOW (ref 44–60)
pH, Ven: 7.39 (ref 7.25–7.43)
pO2, Ven: 49 mmHg — ABNORMAL HIGH (ref 32–45)

## 2022-09-11 LAB — HIV ANTIBODY (ROUTINE TESTING W REFLEX): HIV Screen 4th Generation wRfx: NONREACTIVE

## 2022-09-11 LAB — RESP PANEL BY RT-PCR (FLU A&B, COVID) ARPGX2
Influenza A by PCR: NEGATIVE
Influenza B by PCR: NEGATIVE
SARS Coronavirus 2 by RT PCR: NEGATIVE

## 2022-09-11 LAB — CBG MONITORING, ED
Glucose-Capillary: 125 mg/dL — ABNORMAL HIGH (ref 70–99)
Glucose-Capillary: 126 mg/dL — ABNORMAL HIGH (ref 70–99)
Glucose-Capillary: 120 mg/dL — ABNORMAL HIGH (ref 70–99)

## 2022-09-11 LAB — GLUCOSE, CAPILLARY: Glucose-Capillary: 126 mg/dL — ABNORMAL HIGH (ref 70–99)

## 2022-09-11 LAB — D-DIMER, QUANTITATIVE: D-Dimer, Quant: 3.43 ug/mL-FEU — ABNORMAL HIGH (ref 0.00–0.50)

## 2022-09-11 LAB — TROPONIN I (HIGH SENSITIVITY): Troponin I (High Sensitivity): 63 ng/L — ABNORMAL HIGH (ref <18)

## 2022-09-11 MED ORDER — TRAMADOL HCL 50 MG PO TABS
50.0000 mg | ORAL_TABLET | Freq: Four times a day (QID) | ORAL | Status: DC | PRN
  Administered 2022-09-11 – 2022-09-15 (×7): 50 mg via ORAL
  Filled 2022-09-11 (×7): qty 1

## 2022-09-11 MED ORDER — ONDANSETRON HCL 4 MG PO TABS: 4.0000 mg | ORAL_TABLET | Freq: Four times a day (QID) | ORAL | Status: DC | PRN

## 2022-09-11 MED ORDER — FUROSEMIDE 10 MG/ML IJ SOLN
40.0000 mg | Freq: Once | INTRAMUSCULAR | Status: AC
  Administered 2022-09-11: 40 mg via INTRAVENOUS
  Filled 2022-09-11: qty 4

## 2022-09-11 MED ORDER — CHLORHEXIDINE GLUCONATE CLOTH 2 % EX PADS
6.0000 | MEDICATED_PAD | Freq: Every day | CUTANEOUS | Status: DC
  Administered 2022-09-12 – 2022-09-21 (×7): 6 via TOPICAL

## 2022-09-11 MED ORDER — LIDOCAINE-PRILOCAINE 2.5-2.5 % EX CREA: 1.0000 | TOPICAL_CREAM | CUTANEOUS | Status: DC | PRN

## 2022-09-11 MED ORDER — ONDANSETRON HCL 4 MG/2ML IJ SOLN
4.0000 mg | Freq: Four times a day (QID) | INTRAMUSCULAR | Status: DC | PRN
  Administered 2022-09-18 – 2022-09-23 (×2): 4 mg via INTRAVENOUS
  Filled 2022-09-11 (×2): qty 2

## 2022-09-11 MED ORDER — HEPARIN SODIUM (PORCINE) 1000 UNIT/ML DIALYSIS: 1000.0000 [IU] | INTRAMUSCULAR | Status: DC | PRN

## 2022-09-11 MED ORDER — GABAPENTIN 300 MG PO CAPS
600.0000 mg | ORAL_CAPSULE | Freq: Once | ORAL | Status: AC
  Administered 2022-09-11: 600 mg via ORAL
  Filled 2022-09-11: qty 2

## 2022-09-11 MED ORDER — TRAMADOL HCL 50 MG PO TABS
50.0000 mg | ORAL_TABLET | Freq: Once | ORAL | Status: AC
  Administered 2022-09-11: 50 mg via ORAL
  Filled 2022-09-11: qty 1

## 2022-09-11 MED ORDER — ACETAMINOPHEN 325 MG PO TABS
650.0000 mg | ORAL_TABLET | Freq: Four times a day (QID) | ORAL | Status: DC | PRN
  Administered 2022-09-11 – 2022-09-23 (×3): 650 mg via ORAL
  Filled 2022-09-11 (×3): qty 2

## 2022-09-11 MED ORDER — SODIUM CHLORIDE 0.9 % IV SOLN
2.0000 g | INTRAVENOUS | Status: DC
  Administered 2022-09-11 – 2022-09-12 (×2): 2 g via INTRAVENOUS
  Filled 2022-09-11 (×2): qty 20

## 2022-09-11 MED ORDER — INSULIN ASPART 100 UNIT/ML IJ SOLN
0.0000 [IU] | Freq: Every day | INTRAMUSCULAR | Status: DC
  Administered 2022-09-15: 2 [IU] via SUBCUTANEOUS
  Filled 2022-09-11: qty 1

## 2022-09-11 MED ORDER — INSULIN ASPART 100 UNIT/ML IJ SOLN
0.0000 [IU] | Freq: Three times a day (TID) | INTRAMUSCULAR | Status: DC
  Administered 2022-09-14: 1 [IU] via SUBCUTANEOUS
  Administered 2022-09-15: 4 [IU] via SUBCUTANEOUS
  Administered 2022-09-15: 1 [IU] via SUBCUTANEOUS
  Administered 2022-09-15 – 2022-09-16 (×2): 2 [IU] via SUBCUTANEOUS
  Administered 2022-09-16 (×2): 3 [IU] via SUBCUTANEOUS
  Administered 2022-09-17: 2 [IU] via SUBCUTANEOUS
  Administered 2022-09-17: 3 [IU] via SUBCUTANEOUS
  Administered 2022-09-18: 1 [IU] via SUBCUTANEOUS
  Administered 2022-09-18: 2 [IU] via SUBCUTANEOUS
  Administered 2022-09-20: 1 [IU] via SUBCUTANEOUS
  Filled 2022-09-11 (×14): qty 1

## 2022-09-11 MED ORDER — HYDRALAZINE HCL 20 MG/ML IJ SOLN
5.0000 mg | INTRAMUSCULAR | Status: DC | PRN
  Administered 2022-09-12 – 2022-09-16 (×3): 5 mg via INTRAVENOUS
  Filled 2022-09-11 (×3): qty 1

## 2022-09-11 MED ORDER — GUAIFENESIN ER 600 MG PO TB12
600.0000 mg | ORAL_TABLET | Freq: Two times a day (BID) | ORAL | Status: DC
  Administered 2022-09-11 – 2022-09-20 (×21): 600 mg via ORAL
  Filled 2022-09-11 (×20): qty 1

## 2022-09-11 MED ORDER — ACETAMINOPHEN 500 MG PO TABS
1000.0000 mg | ORAL_TABLET | Freq: Once | ORAL | Status: AC
  Administered 2022-09-11: 1000 mg via ORAL
  Filled 2022-09-11: qty 2

## 2022-09-11 MED ORDER — ALBUTEROL SULFATE (2.5 MG/3ML) 0.083% IN NEBU: 2.5000 mg | INHALATION_SOLUTION | RESPIRATORY_TRACT | Status: DC | PRN

## 2022-09-11 MED ORDER — PENTAFLUOROPROP-TETRAFLUOROETH EX AERO: 1.0000 | INHALATION_SPRAY | CUTANEOUS | Status: DC | PRN

## 2022-09-11 MED ORDER — ACETAMINOPHEN 650 MG RE SUPP: 650.0000 mg | Freq: Four times a day (QID) | RECTAL | Status: DC | PRN

## 2022-09-11 MED ORDER — LIDOCAINE HCL (PF) 1 % IJ SOLN: 5.0000 mL | INTRAMUSCULAR | Status: DC | PRN

## 2022-09-11 MED ORDER — ALTEPLASE 2 MG IJ SOLR: 2.0000 mg | Freq: Once | INTRAMUSCULAR | Status: DC | PRN

## 2022-09-11 MED ORDER — PANTOPRAZOLE SODIUM 40 MG PO TBEC
40.0000 mg | DELAYED_RELEASE_TABLET | Freq: Once | ORAL | Status: AC
  Administered 2022-09-11: 40 mg via ORAL
  Filled 2022-09-11: qty 1

## 2022-09-11 MED ORDER — HEPARIN SODIUM (PORCINE) 5000 UNIT/ML IJ SOLN
5000.0000 [IU] | Freq: Three times a day (TID) | INTRAMUSCULAR | Status: DC
  Administered 2022-09-11 – 2022-09-21 (×30): 5000 [IU] via SUBCUTANEOUS
  Filled 2022-09-11 (×30): qty 1

## 2022-09-11 MED ORDER — ANTICOAGULANT SODIUM CITRATE 4% (200MG/5ML) IV SOLN: 5.0000 mL | Status: DC | PRN

## 2022-09-11 MED ORDER — TECHNETIUM TO 99M ALBUMIN AGGREGATED
4.3900 | Freq: Once | INTRAVENOUS | Status: AC | PRN
  Administered 2022-09-11: 4.39 via INTRAVENOUS

## 2022-09-11 MED ORDER — SODIUM CHLORIDE 0.9 % IV SOLN
500.0000 mg | INTRAVENOUS | Status: DC
  Administered 2022-09-11 – 2022-09-12 (×2): 500 mg via INTRAVENOUS
  Filled 2022-09-11 (×2): qty 5

## 2022-09-11 NOTE — Assessment & Plan Note (Addendum)
History of upper GI bleed 07/2022 EGD on 10/9 showed nonbleeding gastric ulcers and gastritis

## 2022-09-11 NOTE — Assessment & Plan Note (Deleted)
Procalcitonin was 2.28 so will treat for pneumonia Rocephin and azithromycin COVID and flu were negative Antitussives, DuoNebs as needed

## 2022-09-11 NOTE — Assessment & Plan Note (Addendum)
Elevated blood pressure on arrival.

## 2022-09-11 NOTE — Progress Notes (Signed)
*  PRELIMINARY RESULTS* Echocardiogram 2D Echocardiogram has been performed.  Sherrie Sport 09/11/2022, 10:11 AM

## 2022-09-11 NOTE — ED Notes (Signed)
Patient to Nuclear Medicine at this time.

## 2022-09-11 NOTE — Progress Notes (Signed)
   Follow Up Note  HPI: 56 year old Spanish-speaking male with past medical history of end-stage renal disease on hemodialysis and hypertension hospitalized 2 months ago for acute GI bleed from gastric ulcers requiring 3 units packed red blood cells who presented to the emergency room on 11/27 afternoon from dialysis with shortness of breath.  Patient had completed all previous sessions of dialysis.  Workup revealed mild hypoxia and underlying multifocal airway disease and elevated procalcitonin consistent with pneumonia as well as hypertensive urgency with systolic blood pressure of 170's.  Patient admitted to the hospitalist service.  Pt admitted earlier this morning.  In the emergency department  Exam: CV: Regular rate and rhythm, S1-S2 Lungs: Bilateral crackles Abd: Soft, nontender, nondistended, hypoactive bowel sounds Ext: No clubbing cyanosis or edema  Principal Problem:   Acute dyspnea Active Problems:   CAP (community acquired pneumonia)   Elevated d-dimer, possible PE   High anion gap metabolic acidosis   Hypertensive emergency   ESRD on hemodialysis (HCC)   Diabetes mellitus (HCC)   Chronic back pain   Fluid overload   PUD (peptic ulcer disease) Patient responding well to antibiotics.  Now 100% on room air and more comfortable as day progresses.  Repeat labs in the morning.

## 2022-09-11 NOTE — Progress Notes (Addendum)
[facility]  ROUNDING NOTE   Subjective:   Jerry [name] is a 56 year old Spanish-speaking male with past medical conditions including hypertension, chronic back pain, diabetes, and end-stage renal disease on hemodialysis.  Patient presents to the emergency department from his dialysis center with shortness of breath and weakness.  Patient has been admitted for Fluid overload [E87.70]  Patient is known to our practice and receives outpatient dialysis treatments at [facility] on a MWF schedule, supervised by Dr. Candiss Norse.  Patient's treatment was terminated with about 10 minutes remaining due to these complaints.  Video Spanish interpreter utilized for this visit.  Patient states shortness of breath and weakness began 1 to 2 weeks prior.  Progressively worsening.  Denies known fever or chills.  Denies nausea, vomiting, or diarrhea.  Reports room air at baseline.  Has maintained all outpatient dialysis treatments.  Labs on ED arrival significant for serum bicarb 21, BUN 66, creatinine 6.71 with GFR 9, BNP greater than 3200, troponin 62, and hemoglobin 10.3.  Respiratory panel negative for influenza and COVID-19.  Chest x-ray suspicious for pulmonary edema versus multifocal pneumonia.Lower extremity Doppler negative for DVT.  We have been consulted to continue dialysis needs during this admission.   Objective:  Vital signs in last 24 hours:  Temp:  [98 F (36.7 C)-98.5 F (36.9 C)] 98.5 F (36.9 C) (11/28 1411) Pulse Rate:  [70-78] 70 (11/28 1230) Resp:  [15-18] 16 (11/28 1230) BP: (134-158)/(68-78) 158/72 (11/28 1230) SpO2:  [93 %-98 %] 98 % (11/28 1230)  Weight change:  Filed Weights   09/10/22 1544  Weight: 78 kg    Intake/Output: I/O last 3 completed shifts: In: 100 [IV Piggyback:100] Out: -    Intake/Output this shift:  No intake/output data recorded.  Physical Exam: General: NAD  Head: Normocephalic, atraumatic. Moist oral mucosal membranes   Eyes: Anicteric  Lungs:  Right-sided rhonchi, normal effort, [place] O2  Heart: Regular rate and rhythm  Abdomen:  Soft, nontender, nondistended  Extremities: No peripheral edema.  Neurologic: Nonfocal, moving all four extremities  Skin: No lesions  Access: Left aVF    Basic Metabolic Panel: Recent Labs  Lab 09/10/22 1550 09/11/22 0252  NA 135 130*  K 4.2 4.8  CL 96* 95*  CO2 21* 20*  GLUCOSE 126* 165*  BUN 66* 83*  CREATININE 6.71* 8.35*  CALCIUM 9.4 9.0    Liver Function Tests: No results for input(s): "AST", "ALT", "ALKPHOS", "BILITOT", "PROT", "ALBUMIN" in the last 168 hours. No results for input(s): "LIPASE", "AMYLASE" in the last 168 hours. No results for input(s): "AMMONIA" in the last 168 hours.  CBC: Recent Labs  Lab 09/10/22 1550 09/11/22 0252  WBC 8.0 7.8  HGB 10.3* 9.2*  HCT 28.9* 26.5*  MCV 96.0 97.1  PLT 200 185    Cardiac Enzymes: No results for input(s): "CKTOTAL", "CKMB", "CKMBINDEX", "TROPONINI" in the last 168 hours.  BNP: Invalid input(s): "POCBNP"  CBG: Recent Labs  Lab 09/11/22 0716 09/11/22 1213  GLUCAP 125* 126*    Microbiology: Results for orders placed or performed during the hospital encounter of 09/11/22  Resp Panel by RT-PCR (Flu A&B, Covid) Anterior Nasal Swab     Status: None   Collection Time: 09/11/22 12:31 AM   Specimen: Anterior Nasal Swab  Result Value Ref Range Status   SARS Coronavirus 2 by RT PCR NEGATIVE NEGATIVE Final    Comment: (NOTE) SARS-CoV-2 target nucleic acids are NOT DETECTED.  The SARS-CoV-2 RNA is generally detectable in upper respiratory  specimens during the acute phase of infection. The lowest concentration of SARS-CoV-2 viral copies this assay can detect is 138 copies/mL. A negative result does not preclude SARS-Cov-2 infection and should not be used as the sole basis for treatment or other patient management decisions. A negative result may occur with  improper specimen collection/handling,  submission of specimen other than nasopharyngeal swab, presence of viral mutation(s) within the areas targeted by this assay, and inadequate number of viral copies(<138 copies/mL). A negative result must be combined with clinical observations, patient history, and epidemiological information. The expected result is Negative.  Fact Sheet for Patients:  EntrepreneurPulse.com.au  Fact Sheet for Healthcare Providers:  IncredibleEmployment.be  This test is no t yet approved or cleared by the [place] FDA and  has been authorized for detection and/or diagnosis of SARS-CoV-2 by FDA under an Emergency Use Authorization (EUA). This EUA will remain  in effect (meaning this test can be used) for the duration of the COVID-19 declaration under Section 564(b)(1) of the Act, 21 U.S.C.section 360bbb-3(b)(1), unless the authorization is terminated  or revoked sooner.       Influenza A by PCR NEGATIVE NEGATIVE Final   Influenza B by PCR NEGATIVE NEGATIVE Final    Comment: (NOTE) The Xpert Xpress SARS-CoV-2/FLU/RSV plus assay is intended as an aid in the diagnosis of influenza from Nasopharyngeal swab specimens and should not be used as a sole basis for treatment. Nasal washings and aspirates are unacceptable for Xpert Xpress SARS-CoV-2/FLU/RSV testing.  Fact Sheet for Patients: EntrepreneurPulse.com.au  Fact Sheet for Healthcare Providers: IncredibleEmployment.be  This test is not yet approved or cleared by the [place] FDA and has been authorized for detection and/or diagnosis of SARS-CoV-2 by FDA under an Emergency Use Authorization (EUA). This EUA will remain in effect (meaning this test can be used) for the duration of the COVID-19 declaration under Section 564(b)(1) of the Act, 21 U.S.C. section 360bbb-3(b)(1), unless the authorization is terminated or revoked.  Performed at [facility], [address] [place], [place] [address]   Culture, blood (routine x 2) Call MD if unable to obtain prior to antibiotics being given     Status: None (Preliminary result)   Collection Time: 09/11/22  5:22 AM   Specimen: BLOOD  Result Value Ref Range Status   Specimen Description BLOOD RIGHT FA  Final   Special Requests   Final    BOTTLES DRAWN AEROBIC AND ANAEROBIC Blood Culture results may not be optimal due to an inadequate volume of blood received in culture bottles   Culture   Final    NO GROWTH <12 HOURS Performed at [facility], [address], [place], [place] [address]    Report Status PENDING  Incomplete    Coagulation Studies: No results for input(s): "LABPROT", "INR" in the last 72 hours.  Urinalysis: No results for input(s): "COLORURINE", "LABSPEC", "PHURINE", "GLUCOSEU", "HGBUR", "BILIRUBINUR", "KETONESUR", "PROTEINUR", "UROBILINOGEN", "NITRITE", "LEUKOCYTESUR" in the last 72 hours.  Invalid input(s): "APPERANCEUR"    Imaging: NM Pulmonary Perfusion  Result Date: 09/11/2022 CLINICAL DATA:  Intermediate probability EXAM: NUCLEAR MEDICINE PERFUSION LUNG SCAN TECHNIQUE: Perfusion images were obtained in multiple projections after intravenous injection of radiopharmaceutical. Ventilation scans intentionally deferred if perfusion scan and chest x-ray adequate for interpretation during COVID 19 epidemic. RADIOPHARMACEUTICALS:  4.39 mCi Tc-65m MAA IV COMPARISON:  Chest x-ray dated September 10, 2022 FINDINGS: Heterogeneous perfusion of the bilateral lungs which is likely due to diffuse airspace disease seen on prior chest x-ray. Assessment  for segmental perfusion defects is limited and findings are nondiagnostic. IMPRESSION: Nondiagnostic exam. Recommend further evaluation with chest CTA. Electronically Signed   By: Yetta Glassman M.D.   On: 09/11/2022 11:20   US Venous Img Lower Bilateral (DVT)  Result Date: 09/11/2022 CLINICAL DATA:  56 year old male with history  of acute respiratory failure with hypoxia. Elevated D-dimer. EXAM: BILATERAL LOWER EXTREMITY VENOUS DOPPLER ULTRASOUND TECHNIQUE: Gray-scale sonography with compression, as well as color and duplex ultrasound, were performed to evaluate the deep venous system(s) from the level of the common femoral vein through the popliteal and proximal calf veins. COMPARISON:  None Available. FINDINGS: VENOUS Normal compressibility of the common femoral, superficial femoral, and popliteal veins, as well as the visualized calf veins. Visualized portions of profunda femoral vein and great saphenous vein unremarkable. No filling defects to suggest DVT on grayscale or color Doppler imaging. Doppler waveforms show normal direction of venous flow, normal respiratory plasticity and response to augmentation. Limited views of the contralateral common femoral vein are unremarkable. OTHER None. Limitations: none IMPRESSION: Negative. Electronically Signed   By: Vinnie Langton M.D.   On: 09/11/2022 06:22   DG Chest 2 View  Result Date: 09/10/2022 CLINICAL DATA:  Chest pain EXAM: CHEST - 2 VIEW COMPARISON:  07/19/2022 FINDINGS: Enlarged cardiac silhouette. Ectatic aorta. There is patchy airspace disease superimposed on interstitial chronic lung disease. Increased opacities in the LEFT and RIGHT lower lobe. No pneumothorax. IMPRESSION: Patchy bilateral airspace disease superimposed on interstitial lung disease. Findings concerning for multifocal pneumonia versus pulmonary edema. Electronically Signed   By: Suzy Bouchard M.D.   On: 09/10/2022 16:09     Medications:    azithromycin Stopped (09/11/22 0751)   cefTRIAXone (ROCEPHIN)  IV Stopped (09/11/22 0602)    guaiFENesin  600 mg Oral BID   heparin  5,000 Units Subcutaneous Q8H   insulin aspart  0-5 Units Subcutaneous QHS   insulin aspart  0-6 Units Subcutaneous TID WC   acetaminophen **OR** acetaminophen, albuterol, hydrALAZINE, ondansetron **OR** ondansetron (ZOFRAN) IV,  traMADol  Assessment/ Plan:  Jerry [name] is a 56 y.o.  male with past medical conditions including hypertension, chronic back pain, diabetes, and end-stage renal disease on hemodialysis.  Patient presents to the emergency department from his dialysis center with shortness of breath and weakness.  Patient has been admitted for Fluid overload [E87.70]  [facility]/MWF/left aVF  End-stage renal disease on hemodialysis.  Last treatment completed on Monday.  Next treatment scheduled for Wednesday.  2. Anemia of chronic kidney disease Lab Results  Component Value Date   HGB 9.2 (L) 09/11/2022    Hemoglobin within desired target.  Patient receives Mircera at outpatient clinic.  3. Secondary Hyperparathyroidism: with outpatient labs: PTH 818, phosphorus 9.1, calcium 8.9 on 08/27/22.   Lab Results  Component Value Date   CALCIUM 9.0 09/11/2022   PHOS 3.6 04/13/2021    Calcium within acceptable range.  Phosphorus elevated in outpatient setting.  Currently prescribed calcium carbonate and Renvela outpatient.  4.  Community-acquired pneumonia, suspected.  Chest x-ray suspicious for multifocal pneumonia.  Primary team has ordered Rocephin and azithromycin.  Respiratory panel negative.   LOS: 0   11/28/20233:51 PM

## 2022-09-11 NOTE — Assessment & Plan Note (Deleted)
Last hemoglobin A1c 6.4 in October 2023 Patient is diet controlled Sliding scale insulin coverage

## 2022-09-11 NOTE — ED Notes (Signed)
Interpreter [place] used for this assessment.

## 2022-09-11 NOTE — ED Notes (Signed)
MD Caren Macadam requesting pain medication for CP.

## 2022-09-11 NOTE — ED Provider Notes (Signed)
[facility] Provider Note    Event Date/Time   First MD Initiated Contact with Patient 09/11/22 0132     (approximate)   History   Shortness of Breath   HPI  Jerry [name] is a 56 y.o. male with history of hypertension, diabetes, end-stage renal disease on hemodialysis Monday, Wednesday and Friday who presents to the emergency department shortness of breath with exertion and at rest.  States symptoms were present prior to dialysis but continued despite a full dialysis session yesterday.  He has not missed any recent dialysis.  No fevers, cough, chest pain, lower extremity swelling.  Does not wear oxygen at home.  Also complains of chronic back pain and bilateral knee pain for which she takes Neurontin, tramadol and Tylenol.  No numbness, tingling, focal weakness, bowel incontinence.  States he no longer makes urine.  Denies any injury to his knees or back.   History provided by patient and wife using Spanish interpreter.    Past Medical History:  Diagnosis Date   Diabetes mellitus ([place])    ESRD on hemodialysis ([place])    GI bleed 07/19/2022   Hypertension    Renal disorder     Past Surgical History:  Procedure Laterality Date   AV FISTULA PLACEMENT Left    ESOPHAGOGASTRODUODENOSCOPY (EGD) WITH PROPOFOL N/A 07/20/2022   Procedure: ESOPHAGOGASTRODUODENOSCOPY (EGD) WITH PROPOFOL;  Surgeon: Annamaria Helling, DO;  Location: [facility] ENDOSCOPY;  Service: Gastroenterology;  Laterality: N/A;   FOOT SURGERY      MEDICATIONS:  Prior to Admission medications   Medication Sig Start Date End Date Taking? Authorizing Provider  acetaminophen (TYLENOL) 650 MG CR tablet Take 1,300 mg by mouth 2 (two) times daily as needed for pain.    [provider]  calcium carbonate (TUMS - DOSED IN MG ELEMENTAL CALCIUM) 500 MG chewable tablet Chew 3 tablets by mouth 3 (three) times daily. With meals    [provider]  gabapentin (NEURONTIN) 300 MG  capsule Take 1 capsule (300 mg total) by mouth every Monday, Wednesday, and Friday at 6 PM. After dialysis 04/14/21   Elodia Florence., MD  pantoprazole (PROTONIX) 40 MG tablet Take 1 tablet (40 mg total) by mouth 2 (two) times daily. 07/23/22 09/21/22  Nicole Kindred A, DO  Pseudoephedrine-APAP-DM (DAYQUIL MULTI-SYMPTOM PO) Take 15 mLs by mouth daily as needed (cough).    [provider]  RENVELA 800 MG tablet Take 1,600 mg by mouth 2 (two) times daily. After meals 10/24/20   [provider]  traMADol (ULTRAM) 50 MG tablet Take 100 mg by mouth every 12 (twelve) hours as needed. In the morning and evening. 01/18/21   [provider]    Physical Exam   Triage Vital Signs: ED Triage Vitals  Enc Vitals Group     BP 09/10/22 1546 (!) 173/87     Pulse Rate 09/10/22 1546 75     Resp 09/10/22 1546 20     Temp 09/10/22 1546 98.3 F (36.8 C)     Temp Source 09/10/22 1546 Oral     SpO2 09/10/22 1546 96 %     Weight 09/10/22 1544 171 lb 15.3 oz (78 kg)     Height 09/10/22 1544 5\' 6"  (1.676 m)     Head Circumference --      Peak Flow --      Pain Score 09/10/22 1544 8     Pain Loc --      Pain Edu? --  Excl. in [place]? --     Most recent vital signs: Vitals:   09/11/22 0530 09/11/22 0540  BP: (!) 153/77 (!) 153/77  Pulse: 75 75  Resp:  15  Temp:  98.3 F (36.8 C)  SpO2: 94% 94%    CONSTITUTIONAL: Alert and oriented and responds appropriately to questions.  [place] ill-appearing HEAD: Normocephalic, atraumatic EYES: Conjunctivae clear, pupils appear equal, sclera nonicteric ENT: normal nose; moist mucous membranes NECK: Supple, normal ROM CARD: RRR; S1 and S2 appreciated; no murmurs, no clicks, no rubs, no gallops RESP: Normal chest excursion without splinting or tachypnea; breath sounds clear and equal bilaterally; no wheezes, no rhonchi, no rales, patient's sats were 88% on room air at rest during my evaluation, speaking full sentences ABD/GI: Normal  bowel sounds; non-distended; soft, non-tender, no rebound, no guarding, no peritoneal signs BACK: The back appears normal no midline spinal tenderness or step-off or deformity EXT: Normal ROM in all joints; no deformity noted, no edema; no cyanosis, no calf tenderness or calf swelling, full range of motion in both knees without soft tissue swelling, ecchymosis, redness or warmth.  No bony deformity of his knees.  Extremities warm well-perfused.  Compartments are soft.  Graft in the left upper extremity with normal thrill, bruit with no overlying redness, warmth or tenderness. SKIN: Normal color for age and race; warm; no rash on exposed skin NEURO: Moves all extremities equally, normal speech, reports normal sensation diffusely, no saddle anesthesia, no hyperreflexia PSYCH: The patient's mood and manner are appropriate.   ED Results / Procedures / Treatments   LABS: (all labs ordered are listed, but only abnormal results are displayed) Labs Reviewed  BASIC METABOLIC PANEL - Abnormal; Notable for the following components:      Result Value   Chloride 96 (*)    CO2 21 (*)    Glucose, Bld 126 (*)    BUN 66 (*)    Creatinine, Ser 6.71 (*)    GFR, Estimated 9 (*)    Anion gap 18 (*)    All other components within normal limits  CBC - Abnormal; Notable for the following components:   RBC 3.01 (*)    Hemoglobin 10.3 (*)    HCT 28.9 (*)    MCH 34.2 (*)    RDW 19.4 (*)    All other components within normal limits  BRAIN NATRIURETIC PEPTIDE - Abnormal; Notable for the following components:   B Natriuretic Peptide 3,275.5 (*)    All other components within normal limits  BLOOD GAS, VENOUS - Abnormal; Notable for the following components:   pCO2, Ven 36 (*)    pO2, Ven 49 (*)    Acid-base deficit 2.6 (*)    All other components within normal limits  D-DIMER, QUANTITATIVE - Abnormal; Notable for the following components:   D-Dimer, Quant 3.43 (*)    All other components within normal  limits  CBC - Abnormal; Notable for the following components:   RBC 2.73 (*)    Hemoglobin 9.2 (*)    HCT 26.5 (*)    RDW 19.4 (*)    All other components within normal limits  BASIC METABOLIC PANEL - Abnormal; Notable for the following components:   Sodium 130 (*)    Chloride 95 (*)    CO2 20 (*)    Glucose, Bld 165 (*)    BUN 83 (*)    Creatinine, Ser 8.35 (*)    GFR, Estimated 7 (*)    All other components within  normal limits  CBG MONITORING, ED - Abnormal; Notable for the following components:   Glucose-Capillary 125 (*)    All other components within normal limits  TROPONIN I (HIGH SENSITIVITY) - Abnormal; Notable for the following components:   Troponin I (High Sensitivity) 62 (*)    All other components within normal limits  TROPONIN I (HIGH SENSITIVITY) - Abnormal; Notable for the following components:   Troponin I (High Sensitivity) 63 (*)    All other components within normal limits  RESP PANEL BY RT-PCR (FLU A&B, COVID) ARPGX2  CULTURE, BLOOD (ROUTINE X 2)  EXPECTORATED SPUTUM ASSESSMENT W GRAM STAIN, RFLX TO RESP C  CULTURE, BLOOD (ROUTINE X 2)  PROCALCITONIN  URINALYSIS, ROUTINE W REFLEX MICROSCOPIC  HIV ANTIBODY (ROUTINE TESTING W REFLEX)  LEGIONELLA PNEUMOPHILA SEROGP 1 UR AG  STREP PNEUMONIAE URINARY ANTIGEN     EKG:  EKG Interpretation  Date/Time:  Monday September 10 2022 15:47:23 EST Ventricular Rate:  76 PR Interval:  190 QRS Duration: 114 QT Interval:  448 QTC Calculation: 504 R Axis:   152 Text Interpretation: Sinus rhythm with Premature atrial complexes with Abberant conduction Right axis deviation Incomplete right bundle branch block Possible Right ventricular hypertrophy Prolonged QT Abnormal ECG When compared with ECG of 19-Jul-2022 20:16, PREVIOUS ECG IS PRESENT Confirmed by Pryor Curia [phone]) on 09/11/2022 8:03:40 AM         RADIOLOGY: My personal review and interpretation of imaging:  Chest x-ray shows pulmonary edema.  I have  personally reviewed all radiology reports.   US Venous Img Lower Bilateral (DVT)  Result Date: 09/11/2022 CLINICAL DATA:  56 year old male with history of acute respiratory failure with hypoxia. Elevated D-dimer. EXAM: BILATERAL LOWER EXTREMITY VENOUS DOPPLER ULTRASOUND TECHNIQUE: Gray-scale sonography with compression, as well as color and duplex ultrasound, were performed to evaluate the deep venous system(s) from the level of the common femoral vein through the popliteal and proximal calf veins. COMPARISON:  None Available. FINDINGS: VENOUS Normal compressibility of the common femoral, superficial femoral, and popliteal veins, as well as the visualized calf veins. Visualized portions of profunda femoral vein and great saphenous vein unremarkable. No filling defects to suggest DVT on grayscale or color Doppler imaging. Doppler waveforms show normal direction of venous flow, normal respiratory plasticity and response to augmentation. Limited views of the contralateral common femoral vein are unremarkable. OTHER None. Limitations: none IMPRESSION: Negative. Electronically Signed   By: Vinnie Langton M.D.   On: 09/11/2022 06:22   DG Chest 2 View  Result Date: 09/10/2022 CLINICAL DATA:  Chest pain EXAM: CHEST - 2 VIEW COMPARISON:  07/19/2022 FINDINGS: Enlarged cardiac silhouette. Ectatic aorta. There is patchy airspace disease superimposed on interstitial chronic lung disease. Increased opacities in the LEFT and RIGHT lower lobe. No pneumothorax. IMPRESSION: Patchy bilateral airspace disease superimposed on interstitial lung disease. Findings concerning for multifocal pneumonia versus pulmonary edema. Electronically Signed   By: Suzy Bouchard M.D.   On: 09/10/2022 16:09     PROCEDURES:  Critical Care performed: Yes, see critical care procedure note(s)   CRITICAL CARE Performed by: Cyril Mourning Sharron Simpson   Total critical care time: 40 minutes  Critical care time was exclusive of separately billable  procedures and treating other patients.  Critical care was necessary to treat or prevent imminent or life-threatening deterioration.  Critical care was time spent personally by me on the following activities: development of treatment plan with patient and/or surrogate as well as nursing, discussions with consultants, evaluation of patient's response to treatment, examination  of patient, obtaining history from patient or surrogate, ordering and performing treatments and interventions, ordering and review of laboratory studies, ordering and review of radiographic studies, pulse oximetry and re-evaluation of patient's condition.   Marland Kitchen1-3 Lead EKG Interpretation  Performed by: Medina Degraffenreid, Delice Bison, DO Authorized by: Analissa Bayless, Delice Bison, DO     Interpretation: normal     ECG rate:  75   ECG rate assessment: normal     Rhythm: sinus rhythm     Ectopy: PVCs     Conduction: normal       IMPRESSION / MDM / ASSESSMENT AND PLAN / ED COURSE  I reviewed the triage vital signs and the nursing notes.    Patient here with complaints of shortness of breath.  Hypoxic on room air here and does not wear oxygen chronically.  The patient is on the cardiac monitor to evaluate for evidence of arrhythmia and/or significant heart rate changes.   DIFFERENTIAL DIAGNOSIS (includes but not limited to):   Pulmonary edema, CHF, ACS, PE, pneumonia  As for his knee pain there is no sign of fracture, septic arthritis, gout, DVT, arterial obstruction, cellulitis.  Suspect that this is chronic pain from arthritis.   As for his back pain, this also appears to be chronic in nature.  There is no neurologic deficit or red flag symptoms to suggest cauda equina, epidural abscess or hematoma, discitis or osteomyelitis, transverse myelitis, fracture, critical spinal stenosis.   Patient's presentation is most consistent with acute presentation with potential threat to life or bodily function.   PLAN: Labs obtained from triage.   Patient has stable anemia.  No leukocytosis.  Normal potassium.  Troponin slightly elevated but this is appears to be his baseline and they are flat.  COVID and flu were negative.  Chest x-ray reviewed and interpreted by myself and the radiologist and shows acute pulmonary edema.  No infiltrate.  Patient will need admission given his new oxygen requirement and likely dialysis in the morning.  This does not need to be done emergently.  Will discuss with the hospitalist.   [place] ED: Medications  hydrALAZINE (APRESOLINE) injection 5 mg (has no administration in time range)  acetaminophen (TYLENOL) tablet 650 mg (has no administration in time range)    Or  acetaminophen (TYLENOL) suppository 650 mg (has no administration in time range)  ondansetron (ZOFRAN) tablet 4 mg (has no administration in time range)    Or  ondansetron (ZOFRAN) injection 4 mg (has no administration in time range)  heparin injection 5,000 Units (5,000 Units Subcutaneous Given 09/11/22 0521)  insulin aspart (novoLOG) injection 0-6 Units ( Subcutaneous Not Given 09/11/22 0718)  insulin aspart (novoLOG) injection 0-5 Units (has no administration in time range)  albuterol (PROVENTIL) (2.5 MG/3ML) 0.083% nebulizer solution 2.5 mg (has no administration in time range)  guaiFENesin (MUCINEX) 12 hr tablet 600 mg (600 mg Oral Given 09/11/22 0255)  cefTRIAXone (ROCEPHIN) 2 g in sodium chloride 0.9 % 100 mL IVPB (0 g Intravenous Stopped 09/11/22 0602)  azithromycin (ZITHROMAX) 500 mg in sodium chloride 0.9 % 250 mL IVPB (0 mg Intravenous Stopped 09/11/22 0751)  gabapentin (NEURONTIN) capsule 600 mg (600 mg Oral Given 09/11/22 0122)  pantoprazole (PROTONIX) EC tablet 40 mg (40 mg Oral Given 09/11/22 0122)  acetaminophen (TYLENOL) tablet 1,000 mg (1,000 mg Oral Given 09/11/22 0122)  traMADol (ULTRAM) tablet 50 mg (50 mg Oral Given 09/11/22 0122)  furosemide (LASIX) injection 40 mg (40 mg Intravenous Given 09/11/22 0256)  ED COURSE:  Consulted and discussed patient's case with hospitalist, Dr. Damita Dunnings.  I have recommended admission and consulting physician agrees and will place admission orders.  Patient (and family if present) agree with this plan.   I reviewed all nursing notes, vitals, pertinent previous records.  All labs, EKGs, imaging ordered have been independently reviewed and interpreted by myself.     OUTSIDE RECORDS REVIEWED: Reviewed patient's last admission to the hospital on 07/19/2022.       FINAL CLINICAL IMPRESSION(S) / ED DIAGNOSES   Final diagnoses:  Acute pulmonary edema (HCC)  Acute respiratory failure with hypoxia ([place])     Rx / DC Orders   ED Discharge Orders     None        Note:  This document was prepared using Dragon voice recognition software and may include unintentional dictation errors.   Helen Cuff, Delice Bison, DO 09/11/22 [phone]

## 2022-09-11 NOTE — ED Notes (Signed)
Pt ate 50% of meal tray assisted by wife.

## 2022-09-11 NOTE — Assessment & Plan Note (Deleted)
Will get VQ scan and lower extremity Dopplers

## 2022-09-11 NOTE — H&P (Signed)
History and Physical    Patient: Jerry [name] JGG:[id number] DOB: 1966-02-11 DOA: 09/11/2022 DOS: the patient was seen and examined on 09/11/2022 PCP: Freddy Finner, NP  Patient coming from: Home  Chief Complaint:  Chief Complaint  Patient presents with   Shortness of Breath    HPI: Jerry [name] is a 56 y.o. male with medical history significant for ESRD on HD MWF, diet-controlled diabetes, essential hypertension, chronic back pain, hospitalized from 10/6 to 10/9 with acute GI blood loss anemia requiring 3 units PRBCs, and with nonbleeding gastric ulcers and gastritis on EGD, who presents to the ED from dialysis with a complaint of shortness of breath that started a week prior.  Patient denies any missed dialysis sessions and he was able to complete his dialysis session prior to being sent to the ED.  He denied cough, fever or chills or chest pain.  He denied lower extremity pain or swelling.  No recent nausea, vomiting or diarrhea .  He did endorse low back pain and knee pain which is typical for his chronic musculoskeletal pain.  O2 sat with EMS was 93% and he was placed on 2 L O2 for transport. ED course and dataReview: BP 173/87 with pulse 75, respirations 20 and O2 sat 96% on room air.  Afebrile.  Labs significant for troponin 63 and BNP 3275(improved from 4447 on 10/5).  WBC normal, hemoglobin 10.3, improved from 8.1 a month ago.  BMP in keeping with dialysis status but notable for anion gap of 18.  Potassium normal, bicarb 21 and blood glucose 126.  COVID and flu negative. EKG, personally viewed and interpreted showing sinus at 76 with incomplete RBBB. Chest x-ray significant for patchy bilateral airspace disease superimposed on interstitial lung disease, consistent with multifocal pneumonia versus pulmonary edema. Patient received his home pain meds of Tylenol, tramadol and gabapentin for his complaints of pain.  He also received his home Protonix.  Discharge was  attempted however patient desaturated to 88% after removal of oxygen.  Hospitalist consulted for admission.   Review of Systems: As mentioned in the history of present illness. All other systems reviewed and are negative.  Past Medical History:  Diagnosis Date   Diabetes mellitus ([place])    ESRD on hemodialysis ([place])    GI bleed 07/19/2022   Hypertension    Renal disorder    Past Surgical History:  Procedure Laterality Date   AV FISTULA PLACEMENT Left    ESOPHAGOGASTRODUODENOSCOPY (EGD) WITH PROPOFOL N/A 07/20/2022   Procedure: ESOPHAGOGASTRODUODENOSCOPY (EGD) WITH PROPOFOL;  Surgeon: Annamaria Helling, DO;  Location: [facility] ENDOSCOPY;  Service: Gastroenterology;  Laterality: N/A;   FOOT SURGERY     Social History:  reports that he has never smoked. He has never used smokeless tobacco. He reports current alcohol use. He reports that he does not use drugs.  No Known Allergies  Family History  Problem Relation Age of Onset   Kidney failure Mother    Glaucoma Mother    Kidney failure Father     Prior to Admission medications   Medication Sig Start Date End Date Taking? Authorizing Provider  acetaminophen (TYLENOL) 650 MG CR tablet Take 1,300 mg by mouth 2 (two) times daily as needed for pain.    [provider]  calcium carbonate (TUMS - DOSED IN MG ELEMENTAL CALCIUM) 500 MG chewable tablet Chew 3 tablets by mouth 3 (three) times daily. With meals    [provider]  gabapentin (NEURONTIN) 300 MG capsule Take 1 capsule (300 mg  total) by mouth every Monday, Wednesday, and Friday at 6 PM. After dialysis 04/14/21   Elodia Florence., MD  pantoprazole (PROTONIX) 40 MG tablet Take 1 tablet (40 mg total) by mouth 2 (two) times daily. 07/23/22 09/21/22  Nicole Kindred A, DO  Pseudoephedrine-APAP-DM (DAYQUIL MULTI-SYMPTOM PO) Take 15 mLs by mouth daily as needed (cough).    [provider]  RENVELA 800 MG tablet Take 1,600 mg by mouth 2 (two) times daily. After  meals 10/24/20   [provider]  traMADol (ULTRAM) 50 MG tablet Take 100 mg by mouth every 12 (twelve) hours as needed. In the morning and evening. 01/18/21   [provider]    Physical Exam: Vitals:   09/10/22 1544 09/10/22 1546 09/11/22 0030 09/11/22 0102  BP:  (!) 173/87 (!) 156/78   Pulse:  75 70   Resp:  20 15   Temp:  98.3 F (36.8 C) 98.1 F (36.7 C) 98.5 F (36.9 C)  TempSrc:  Oral Oral Rectal  SpO2:  96% 97%   Weight: 78 kg     Height: 5\' 6"  (1.676 m)      Physical Exam Vitals and nursing note reviewed.  Constitutional:      General: He is not in acute distress. HENT:     Head: Normocephalic and atraumatic.  Cardiovascular:     Rate and Rhythm: Normal rate and regular rhythm.     Heart sounds: Normal heart sounds.  Pulmonary:     Effort: Pulmonary effort is normal.     Breath sounds: Normal breath sounds.  Abdominal:     Palpations: Abdomen is soft.     Tenderness: There is no abdominal tenderness.  Neurological:     Mental Status: Mental status is at baseline.     Labs on Admission: I have personally reviewed following labs and imaging studies  CBC: Recent Labs  Lab 09/10/22 1550  WBC 8.0  HGB 10.3*  HCT 28.9*  MCV 96.0  PLT 947   Basic Metabolic Panel: Recent Labs  Lab 09/10/22 1550  NA 135  K 4.2  CL 96*  CO2 21*  GLUCOSE 126*  BUN 66*  CREATININE 6.71*  CALCIUM 9.4   GFR: Estimated Creatinine Clearance: 11.9 mL/min (A) (by C-G formula based on SCr of 6.71 mg/dL (H)). Liver Function Tests: No results for input(s): "AST", "ALT", "ALKPHOS", "BILITOT", "PROT", "ALBUMIN" in the last 168 hours. No results for input(s): "LIPASE", "AMYLASE" in the last 168 hours. No results for input(s): "AMMONIA" in the last 168 hours. Coagulation Profile: No results for input(s): "INR", "PROTIME" in the last 168 hours. Cardiac Enzymes: No results for input(s): "CKTOTAL", "CKMB", "CKMBINDEX", "TROPONINI" in the last 168 hours. BNP (last  3 results) No results for input(s): "PROBNP" in the last 8760 hours. HbA1C: No results for input(s): "HGBA1C" in the last 72 hours. CBG: No results for input(s): "GLUCAP" in the last 168 hours. Lipid Profile: No results for input(s): "CHOL", "HDL", "LDLCALC", "TRIG", "CHOLHDL", "LDLDIRECT" in the last 72 hours. Thyroid Function Tests: No results for input(s): "TSH", "T4TOTAL", "FREET4", "T3FREE", "THYROIDAB" in the last 72 hours. Anemia Panel: No results for input(s): "VITAMINB12", "FOLATE", "FERRITIN", "TIBC", "IRON", "RETICCTPCT" in the last 72 hours. Urine analysis:    Component Value Date/Time   COLORURINE Yellow 12/30/2013 1725   APPEARANCEUR Clear 12/30/2013 1725   LABSPEC 1.018 12/30/2013 1725   PHURINE 6.0 12/30/2013 1725   GLUCOSEU >=500 12/30/2013 1725   HGBUR Negative 12/30/2013 1725   BILIRUBINUR Negative  12/30/2013 1725   KETONESUR Negative 12/30/2013 1725   PROTEINUR >=500 12/30/2013 1725   NITRITE Negative 12/30/2013 1725   LEUKOCYTESUR Negative 12/30/2013 1725    Radiological Exams on Admission: DG Chest 2 View  Result Date: 09/10/2022 CLINICAL DATA:  Chest pain EXAM: CHEST - 2 VIEW COMPARISON:  07/19/2022 FINDINGS: Enlarged cardiac silhouette. Ectatic aorta. There is patchy airspace disease superimposed on interstitial chronic lung disease. Increased opacities in the LEFT and RIGHT lower lobe. No pneumothorax. IMPRESSION: Patchy bilateral airspace disease superimposed on interstitial lung disease. Findings concerning for multifocal pneumonia versus pulmonary edema. Electronically Signed   By: Suzy Bouchard M.D.   On: 09/10/2022 16:09     Data Reviewed: Relevant notes from primary care and specialist visits, past discharge summaries as available in EHR, including Care Everywhere. Prior diagnostic testing as pertinent to current admission diagnoses Updated medications and problem lists for reconciliation ED course, including vitals, labs, imaging, treatment  and response to treatment Triage notes, nursing and pharmacy notes and ED provider's notes Notable results as noted in HPI   Assessment and Plan: * Acute dyspnea Hypoxia Patient with 1 week of shortness of breath, elevated BNP, hypoxia to 88% Etiology either infectious or related to volume overload, with underlying interstitial lung disease, per chest x-ray Lower suspicion for venous thromboembolism, given absence of tachycardia, lower extremity pain or edema, though not completely ruled out BNP over 3000 but improved from 4447 a month ago For possible fluid overload/possible CHF: Trial of Lasix, echocardiogram, cardiac monitoring, daily weights For possible pneumonia: Patient has no cough or fever.  Will get procalcitonin For possible PE: We will get D-dimer and if elevated, further work-up with V/Q versus CTA chest to rule out PE Interstitial lung disease: Can consider pulmonology consult Supplemental oxygen to keep sats over 94% Trial of Lasix Follow procalcitonin, D-dimer Will hold off on any empiric antibiotics pending procalcitonin   Elevated d-dimer, possible PE Will get VQ scan and lower extremity Dopplers  CAP (community acquired pneumonia) Procalcitonin was 2.28 so will treat for pneumonia Rocephin and azithromycin COVID and flu were negative Antitussives, DuoNebs as needed  Hypertensive emergency BP on arrival, postdialysis elevated to 173/87 with BNP over 3000 and possible pulmonary edema on chest x-ray Patient is not currently on antihypertensives As needed IV hydralazine to keep BP under 130/80, and add orals if still needed for control Follow-up echocardiogram  High anion gap metabolic acidosis Anion gap of 18 with bicarb of 21 glucose 235 Etiology uncertain We will get a venous pH and Consider dose of bicarb  ESRD on hemodialysis [facility]) Patient denies missed dialysis sessions Nephrology consult for continuation of dialysis  Diabetes mellitus ([place]) Last  hemoglobin A1c 6.4 in October 2023 Patient is diet controlled Sliding scale insulin coverage  PUD (peptic ulcer disease) History of upper GI bleed 07/2022 EGD on 10/9 showed nonbleeding gastric ulcers and gastritis Continue Protonix        DVT prophylaxis: Heparin  Consults: Nephrology, Dr. Candiss Norse  Advance Care Planning:   Code Status: Prior   Family Communication: none  Disposition Plan: Back to previous home environment  Severity of Illness: The appropriate patient status for this patient is INPATIENT. Inpatient status is judged to be reasonable and necessary in order to provide the required intensity of service to ensure the patient's safety. The patient's presenting symptoms, physical exam findings, and initial radiographic and laboratory data in the context of their chronic comorbidities is felt to place them at high risk for further  clinical deterioration. Furthermore, it is not anticipated that the patient will be medically stable for discharge from the hospital within 2 midnights of admission.   * I certify that at the point of admission it is my clinical judgment that the patient will require inpatient hospital care spanning beyond 2 midnights from the point of admission due to high intensity of service, high risk for further deterioration and high frequency of surveillance required.*  Author: Athena Masse, MD 09/11/2022 1:41 AM  For on call review [email or web address].

## 2022-09-11 NOTE — Assessment & Plan Note (Deleted)
Anion gap of 18 with bicarb of 21 glucose 051 Etiology uncertain We will get a venous pH and Consider dose of bicarb

## 2022-09-11 NOTE — ED Notes (Signed)
Pt 02 sat noted to drop to 885 on RA while MD Ward at bedside. Pt placed on 2L 02 via  by provider with improvement of 02 sat to 96%

## 2022-09-11 NOTE — ED Notes (Signed)
Breakfast tray placed at bedside.  

## 2022-09-11 NOTE — Assessment & Plan Note (Addendum)
Patient had dialysis today.  Called into room about stopping dialysis.

## 2022-09-11 NOTE — Assessment & Plan Note (Deleted)
Hypoxia Patient with 1 week of shortness of breath, elevated BNP, hypoxia to 88% Etiology either infectious or related to volume overload, with underlying interstitial lung disease, per chest x-ray Lower suspicion for venous thromboembolism, given absence of tachycardia, lower extremity pain or edema, though not completely ruled out BNP over 3000 but improved from 4447 a month ago For possible fluid overload/possible CHF: Trial of Lasix, echocardiogram, cardiac monitoring, daily weights For possible pneumonia: Patient has no cough or fever.  Will get procalcitonin For possible PE: We will get D-dimer and if elevated, further work-up with V/Q versus CTA chest to rule out PE Interstitial lung disease: Can consider pulmonology consult Supplemental oxygen to keep sats over 94% Trial of Lasix Follow procalcitonin, D-dimer Will hold off on any empiric antibiotics pending procalcitonin

## 2022-09-11 NOTE — ED Notes (Signed)
Pt given dinner tray at this time.  

## 2022-09-11 NOTE — Hospital Course (Addendum)
56 year old Spanish-speaking male with past medical history of end-stage renal disease on hemodialysis and hypertension hospitalized 2 months ago for acute GI bleed from gastric ulcers requiring 3 units packed red blood cells who presented to the emergency room on 11/27 afternoon from dialysis with shortness of breath.  Patient had completed all previous sessions of dialysis.  Workup revealed mild hypoxia and underlying multifocal airway disease and elevated procalcitonin consistent with pneumonia as well as hypertensive urgency with systolic blood pressure of 170's.  Patient admitted to the hospitalist service.  By 11/29, patient with little improvement.  Follow-up procalcitonin that day had increased to 4.74 (previously 2.3) and Rocephin/Zithromax changed to cefepime.  Although only receiving 1 dose, procalcitonin up to 8.3 by following day and CT scan of chest ordered.  By following day, patient with only mild improvement and worsening procalcitonin and antibiotics adjusted.   12/1: CAT scan reviewed.  Patient with significant interstitial lung disease, severe cardiomegaly/fluid overload, radiographic findings of ESLD/cirrhosis.  Lengthy discussion between myself, patient's wife, patient's daughter, palliative care.  Explained that patient has evidence of multiorgan failure and overall prognosis remains poor.  Patient states that he would not be want to be kept on the ventilator long-term however did want chest compressions and endotracheal intubation in the setting cardiopulmonary arrest.   12/2: Hyperglycemia noted.  Likely secondary to steroids.   12/3: No appreciable status changes.  Patient continues to complain of upper back pain. 12/5: No appreciable clinical changes.  Patient continues to endorse significant pain mainly in upper back.  Palliative care had a discussion with patient and multiple family members including 4 children today.  Patient now a DNR.  Further discussion about goals of care  with palliative scheduled for 12/7.  12/7.  Patient wants to continue full treatment course.  I asked physical therapy to reevaluate because patient complains of weakness and family concerned about taking him home.  Did worse with physical therapy and was orthostatic and dropped his oxygen saturations.  Midodrine started.  12/8.  Patient interested in stopping dialysis at this time and moving towards hospice care inpatient facility.  Explained that he has multiple organ systems that are requiring support including a blood pressure, lungs with interstitial lung disease and oxygen supplementation, kidneys with dialysis and liver with cirrhosis.  Consulted hospice liaison.  12/10.  Patient having more shortness of breath this morning.  Started Dilaudid drip after family arrived.  This afternoon and notified that we did get a hospice home bed.  Family interested in hospice home.  Patient also agreeable.  Spoke with hospice liaison and will discontinue Dilaudid drip prior to transport.  Hospice home will do pushes of Dilaudid over there to start with.

## 2022-09-12 DIAGNOSIS — N186 End stage renal disease: Secondary | ICD-10-CM

## 2022-09-12 DIAGNOSIS — Z992 Dependence on renal dialysis: Secondary | ICD-10-CM

## 2022-09-12 DIAGNOSIS — J189 Pneumonia, unspecified organism: Secondary | ICD-10-CM

## 2022-09-12 DIAGNOSIS — E663 Overweight: Secondary | ICD-10-CM

## 2022-09-12 LAB — CBC
HCT: 25.7 % — ABNORMAL LOW (ref 39.0–52.0)
Hemoglobin: 8.9 g/dL — ABNORMAL LOW (ref 13.0–17.0)
MCH: 34.2 pg — ABNORMAL HIGH (ref 26.0–34.0)
MCHC: 34.6 g/dL (ref 30.0–36.0)
MCV: 98.8 fL (ref 80.0–100.0)
Platelets: 168 10*3/uL (ref 150–400)
RBC: 2.6 MIL/uL — ABNORMAL LOW (ref 4.22–5.81)
RDW: 19.9 % — ABNORMAL HIGH (ref 11.5–15.5)
WBC: 9.1 10*3/uL (ref 4.0–10.5)
nRBC: 0 % (ref 0.0–0.2)

## 2022-09-12 LAB — BASIC METABOLIC PANEL
Anion gap: 19 — ABNORMAL HIGH (ref 5–15)
BUN: 99 mg/dL — ABNORMAL HIGH (ref 6–20)
CO2: 17 mmol/L — ABNORMAL LOW (ref 22–32)
Calcium: 9.2 mg/dL (ref 8.9–10.3)
Chloride: 96 mmol/L — ABNORMAL LOW (ref 98–111)
Creatinine, Ser: 10.6 mg/dL — ABNORMAL HIGH (ref 0.61–1.24)
GFR, Estimated: 5 mL/min — ABNORMAL LOW (ref >60)
Glucose, Bld: 125 mg/dL — ABNORMAL HIGH (ref 70–99)
Potassium: 5.5 mmol/L — ABNORMAL HIGH (ref 3.5–5.1)
Sodium: 132 mmol/L — ABNORMAL LOW (ref 135–145)

## 2022-09-12 LAB — GLUCOSE, CAPILLARY
Glucose-Capillary: 113 mg/dL — ABNORMAL HIGH (ref 70–99)
Glucose-Capillary: 137 mg/dL — ABNORMAL HIGH (ref 70–99)
Glucose-Capillary: 162 mg/dL — ABNORMAL HIGH (ref 70–99)

## 2022-09-12 LAB — PROCALCITONIN: Procalcitonin: 4.74 ng/mL

## 2022-09-12 LAB — HEPATITIS B SURFACE ANTIGEN: Hepatitis B Surface Ag: NONREACTIVE

## 2022-09-12 MED ORDER — PANTOPRAZOLE SODIUM 40 MG PO TBEC
40.0000 mg | DELAYED_RELEASE_TABLET | Freq: Once | ORAL | Status: AC
  Administered 2022-09-12: 40 mg via ORAL
  Filled 2022-09-12: qty 1

## 2022-09-12 MED ORDER — HYDROMORPHONE HCL 1 MG/ML IJ SOLN
0.2500 mg | INTRAMUSCULAR | Status: DC | PRN
  Administered 2022-09-13 – 2022-09-22 (×11): 0.25 mg via INTRAVENOUS
  Filled 2022-09-12 (×14): qty 0.5

## 2022-09-12 MED ORDER — GABAPENTIN 300 MG PO CAPS
300.0000 mg | ORAL_CAPSULE | ORAL | Status: DC
  Administered 2022-09-12 – 2022-09-14 (×2): 300 mg via ORAL
  Filled 2022-09-12 (×2): qty 1

## 2022-09-12 MED ORDER — SODIUM CHLORIDE 0.9 % IV SOLN
1.0000 g | INTRAVENOUS | Status: AC
  Administered 2022-09-12 – 2022-09-18 (×7): 1 g via INTRAVENOUS
  Filled 2022-09-12 (×5): qty 10
  Filled 2022-09-12: qty 1
  Filled 2022-09-12: qty 10
  Filled 2022-09-12: qty 1

## 2022-09-12 MED ORDER — MORPHINE SULFATE (PF) 2 MG/ML IV SOLN: 2.0000 mg | INTRAVENOUS | Status: DC | PRN

## 2022-09-12 NOTE — Consult Note (Signed)
Pharmacy Antibiotic Note  Jerry [name] is a 56 y.o. male w/ h/o ESRD-HD & HTN admitted on 09/11/2022 with pneumonia.  Pharmacy has been consulted for cefepime dosing.  Plan: Per [facility], CRO/Azith >> CFP ISO "Worsening procalcitonin on Rocephin/Zithromax with pneumonia " Initiate cefepime 1g q24h (ESRD-HD dosing) - give post-HD on HD days  Height: 5\' 6"  (167.6 cm) Weight: 82.1 kg (181 lb) IBW/kg (Calculated) : 63.8  Temp (24hrs), Avg:98.6 F (37 C), Min:97.9 F (36.6 C), Max:99.8 F (37.7 C)  Recent Labs  Lab 09/10/22 1550 09/11/22 0252 09/12/22 0607  WBC 8.0 7.8 9.1  CREATININE 6.71* 8.35* 10.60*    Estimated Creatinine Clearance: 7.7 mL/min (A) (by C-G formula based on SCr of 10.6 mg/dL (H)).    Allergies  Allergen Reactions   No Allergies On File     Antimicrobials this admission: CRO/Azith x1 in ED (11/29); then CFP (11/29 >>   Dose adjustments this admission: CTM and adjust PRN per HD schedule. Nephrology consulted and following  Microbiology results: 11/29 BCx: sent 11/29 Sputum: sent  11/29 COV/FLU PCR: negative  Thank you for allowing pharmacy to be a part of this patient's care.  Shanon Brow Mitchel Delduca 09/12/2022 7:23 PM

## 2022-09-12 NOTE — Plan of Care (Signed)
  Problem: Education: Goal: Ability to verbalize understanding of medication therapies will improve Outcome: Progressing   

## 2022-09-12 NOTE — Progress Notes (Signed)
   09/12/22 1335  Vitals  Temp 98.7 F (37.1 C)  Temp Source Oral  BP (!) 174/81  MAP (mmHg) 106  BP Location Right Arm  BP Method Automatic  Patient Position (if appropriate) Lying  Pulse Rate 78  Pulse Rate Source Monitor  ECG Heart Rate 78  Resp 18  Oxygen Therapy  SpO2 99 %  O2 Device Nasal Cannula  O2 Flow Rate (L/min) 2 L/min  Patient Activity (if Appropriate) In bed  Pulse Oximetry Type Continuous  During Treatment Monitoring  Blood Flow Rate (mL/min) 200 mL/min  HD Safety Checks Performed Yes  Intra-Hemodialysis Comments Tx completed  Post Treatment  Dialyzer Clearance Lightly streaked  Duration of HD Treatment -hour(s) 3.5 hour(s)  Hemodialysis Intake (mL) 0 mL  Liters Processed 82  Fluid Removed (mL) 3500 mL  Tolerated HD Treatment Yes  Post-Hemodialysis Comments hd tx completed. no complications.  AVG/AVF Arterial Site Held (minutes) 10 minutes  AVG/AVF Venous Site Held (minutes) 10 minutes  Fistula / Graft Left Upper arm  No placement date or time found.   Placed prior to admission: Yes  Orientation: Left  Access Location: Upper arm  Site Condition No complications  Fistula / Graft Assessment Present;Thrill;Bruit  Status Deaccessed  Needle Size 15  Drainage Description None

## 2022-09-12 NOTE — Progress Notes (Signed)
[facility] Progress Note  Patient: Jerry [name]    QMG:[id number]  DOA: 09/11/2022    Date of Service: the patient was seen and examined on 09/12/2022  Brief hospital course: 56 year old Spanish-speaking male with past medical history of end-stage renal disease on hemodialysis and hypertension hospitalized 2 months ago for acute GI bleed from gastric ulcers requiring 3 units packed red blood cells who presented to the emergency room on 11/27 afternoon from dialysis with shortness of breath.  Patient had completed all previous sessions of dialysis.  Workup revealed mild hypoxia and underlying multifocal airway disease and elevated procalcitonin consistent with pneumonia as well as hypertensive urgency with systolic blood pressure of 170's.  Patient admitted to the hospitalist service.  By following day, patient with only mild improvement and worsening procalcitonin and antibiotics adjusted.  Assessment and Plan: Multi lobar pneumonia causing acute respiratory failure with hypoxia: Sepsis and pulmonary embolus ruled out.  Continue oxygen, 3 L nasal cannula.  Procalcitonin increased from 2-4, so changing Rocephin/Zithromax to cefepime.  Recheck labs in the morning.  End-stage renal disease on hemodialysis: Nephrology following.  Diabetes mellitus: Diet controlled  Malignant hypertension: Multifactorial secondary to pain, shortness of breath as well as increasing volume overload  Overweight: Meets criteria for BMI greater than 25   Body mass index is 29.21 kg/m.        Consultants: Nephrology  Procedures: Hemodialysis  Antimicrobials: IV Rocephin and Zithromax 11/28 - 11/29 IV cefepime 11/29-present  Code Status: Full code   Subjective: (Through [place] interpreter) patient complains of right lower quadrant chest pain  Objective: Noted elevated blood pressures Vitals:   09/12/22 1447 09/12/22 1609  BP: (!) 166/68 (!) 158/68  Pulse: 86 82  Resp:  18  Temp:   98 F (36.7 C)  SpO2: 93% 96%    Intake/Output Summary (Last 24 hours) at 09/12/2022 1749 Last data filed at 09/12/2022 1449 Gross per 24 hour  Intake 505.16 ml  Output 3500 ml  Net -2994.84 ml   Filed Weights   09/10/22 1544 09/11/22 2008 09/12/22 0940  Weight: 78 kg 78.6 kg 82.1 kg   Body mass index is 29.21 kg/m.  Exam:  General: Alert and oriented x 3, mild distress secondary to pain HEENT: Normocephalic, atraumatic, mucous membranes slightly dry Cardiovascular: Regular rate and rhythm, S1-S2 Respiratory: Decreased breath sounds with few crackles on right base Abdomen: Soft, nontender, nondistended, positive bowel Musculoskeletal: No clubbing or cyanosis or any Skin: No skin breaks, tears or lesion Psychiatry: Appropriate, no evidence of psychoses Neurology: No focal deficits  Data Reviewed: Noted worsening procalcitonin, potassium of 5.5  Disposition:  Status is: Inpatient Remains inpatient appropriate because:  -Improvement in hypoxia -Treatment of pneumonia    Anticipated discharge date: 12/1  Family Communication: Wife at the bedside DVT Prophylaxis: heparin injection 5,000 Units Start: 09/11/22 0600    Author: Annita Brod ,MD 09/12/2022 5:49 PM  To reach On-call, see care teams to locate the attending and reach out via [email or web address]. Between 7PM-7AM, please contact night-coverage If you still have difficulty reaching the attending provider, please page the [facility] (Director on Call) for [facility] on amion for assistance.

## 2022-09-12 NOTE — TOC Initial Note (Signed)
Transition of Care [facility]) - Initial/Assessment Note    Patient Details  Name: Jerry [name] MRN: [id number] Date of Birth: Jul 01, 1966  Transition of Care [facility]) CM/SW Contact:    Beverly Sessions, RN Phone Number: 09/12/2022, 11:12 AM  Clinical Narrative:                 dialysis patients as they fluid levels are followed by the nephrologist   Admitted for:SOB Admitted from: Home with wife who is at home 24/7 PCP: Elsworth Soho at [place] [facility]: [facility] Current home health/prior home health/DME: Received RW previous admission  [facility] consult for heart failure.  Notified [facility] Failure navigator - per Delmar Landau they do not follow dialysis patients as they fluid levels are followed by the nephrologist   Elvera Bicker dialysis liaison notified of admission.    Patient currently requiring acute O2   Expected Discharge Plan: Home/Self Care     Patient Goals and CMS Choice        Expected Discharge Plan and Services Expected Discharge Plan: Home/Self Care     Post Acute Care Choice: Durable Medical Equipment                                        Prior Living Arrangements/Services   Lives with:: Spouse                   Activities of Daily Living Home Assistive Devices/Equipment: Cane (specify quad or straight) ADL Screening (condition at time of admission) Patient's cognitive ability adequate to safely complete daily activities?: Yes Is the patient deaf or have difficulty hearing?: No Does the patient have difficulty seeing, even when wearing glasses/contacts?: No Does the patient have difficulty concentrating, remembering, or making decisions?: No Patient able to express need for assistance with ADLs?: Yes Does the patient have difficulty dressing or bathing?: No Independently performs ADLs?: Yes (appropriate for developmental age) Does the patient have difficulty walking or climbing stairs?: Yes Weakness of Legs: Both Weakness of  Arms/Hands: None  Permission Sought/Granted                  Emotional Assessment              Admission diagnosis:  Acute pulmonary edema ([place]) [J81.0] Pneumonia [J18.9] Acute respiratory failure with hypoxia ([place]) [J96.01] Fluid overload [E87.70] Patient Active Problem List   Diagnosis Date Noted   Fluid overload 09/11/2022   High anion gap metabolic acidosis 60/73/7106   Acute dyspnea 09/11/2022   Hypertensive emergency 09/11/2022   PUD (peptic ulcer disease) 09/11/2022   CAP (community acquired pneumonia) 09/11/2022   Elevated d-dimer, possible PE 09/11/2022   Pneumonia 09/11/2022   Thrombocytopenia ([place]) 07/22/2022   Chronic back pain 07/21/2022   ABLA (acute blood loss anemia) 07/20/2022   Hyperkalemia 07/19/2022   ESRD on hemodialysis ([place]) 04/11/2021   Abdominal pain 04/11/2021   Hyponatremia 04/11/2021   Diabetes mellitus ([place]) 07/21/2013   Hypertension 02/18/2013   PCP:  Freddy Finner, NP Pharmacy:   [place], [address] [place] [address] Phone: [phone] Fax: [phone]     Social Determinants of Health (SDOH) Interventions    Readmission Risk Interventions     No data to display

## 2022-09-12 NOTE — Progress Notes (Signed)
[facility]  ROUNDING NOTE   Subjective:   Jerry [name] is a 56 year old Spanish-speaking male with past medical conditions including hypertension, chronic back pain, diabetes, and end-stage renal disease on hemodialysis.  Patient presents to the emergency department from his dialysis center with shortness of breath and weakness.  Patient has been admitted for Acute pulmonary edema (HCC) [J81.0] Pneumonia [J18.9] Acute respiratory failure with hypoxia ([place]) [J96.01] Fluid overload [E87.70]  Patient is known to our practice and receives outpatient dialysis treatments at [facility] on a MWF schedule, supervised by Dr. Candiss Norse.   Patient seen and evaluated during dialysis   HEMODIALYSIS FLOWSHEET:  Blood Flow Rate (mL/min): 400 mL/min Arterial Pressure (mmHg): -130 mmHg Venous Pressure (mmHg): 270 mmHg TMP (mmHg): 15 mmHg Ultrafiltration Rate (mL/min): 1257 mL/min Dialysate Flow Rate (mL/min): 300 ml/min  No complaints at this time Tolerating treatment well   Objective:  Vital signs in last 24 hours:  Temp:  [97.9 F (36.6 C)-99.8 F (37.7 C)] 98.9 F (37.2 C) (11/29 0954) Pulse Rate:  [70-86] 70 (11/29 1130) Resp:  [16-20] 18 (11/29 1130) BP: (116-163)/(62-80) 148/66 (11/29 1130) SpO2:  [96 %-100 %] 99 % (11/29 1130) Weight:  [78.6 kg-82.1 kg] 82.1 kg (11/29 0940)  Weight change: 0.6 kg Filed Weights   09/10/22 1544 09/11/22 2008 09/12/22 0940  Weight: 78 kg 78.6 kg 82.1 kg    Intake/Output: I/O last 3 completed shifts: In: 355.2 [IV Piggyback:355.2] Out: 0    Intake/Output this shift:  No intake/output data recorded.  Physical Exam: General: NAD  Head: Normocephalic, atraumatic. Moist oral mucosal membranes  Eyes: Anicteric  Lungs:  Right-sided rhonchi, normal effort, [place] O2  Heart: Regular rate and rhythm  Abdomen:  Soft, nontender, nondistended  Extremities: No peripheral edema.  Neurologic: Nonfocal, moving all four  extremities  Skin: No lesions  Access: Left aVF    Basic Metabolic Panel: Recent Labs  Lab 09/10/22 1550 09/11/22 0252 09/12/22 0607  NA 135 130* 132*  K 4.2 4.8 5.5*  CL 96* 95* 96*  CO2 21* 20* 17*  GLUCOSE 126* 165* 125*  BUN 66* 83* 99*  CREATININE 6.71* 8.35* 10.60*  CALCIUM 9.4 9.0 9.2     Liver Function Tests: No results for input(s): "AST", "ALT", "ALKPHOS", "BILITOT", "PROT", "ALBUMIN" in the last 168 hours. No results for input(s): "LIPASE", "AMYLASE" in the last 168 hours. No results for input(s): "AMMONIA" in the last 168 hours.  CBC: Recent Labs  Lab 09/10/22 1550 09/11/22 0252 09/12/22 0607  WBC 8.0 7.8 9.1  HGB 10.3* 9.2* 8.9*  HCT 28.9* 26.5* 25.7*  MCV 96.0 97.1 98.8  PLT 200 185 168     Cardiac Enzymes: No results for input(s): "CKTOTAL", "CKMB", "CKMBINDEX", "TROPONINI" in the last 168 hours.  BNP: Invalid input(s): "POCBNP"  CBG: Recent Labs  Lab 09/11/22 0716 09/11/22 1213 09/11/22 1627 09/11/22 2155 09/12/22 0805  GLUCAP 125* 126* 120* 126* 113*     Microbiology: Results for orders placed or performed during the hospital encounter of 09/11/22  Resp Panel by RT-PCR (Flu A&B, Covid) Anterior Nasal Swab     Status: None   Collection Time: 09/11/22 12:31 AM   Specimen: Anterior Nasal Swab  Result Value Ref Range Status   SARS Coronavirus 2 by RT PCR NEGATIVE NEGATIVE Final    Comment: (NOTE) SARS-CoV-2 target nucleic acids are NOT DETECTED.  The SARS-CoV-2 RNA is generally detectable in upper respiratory specimens during the acute phase of infection. The lowest concentration of  SARS-CoV-2 viral copies this assay can detect is 138 copies/mL. A negative result does not preclude SARS-Cov-2 infection and should not be used as the sole basis for treatment or other patient management decisions. A negative result may occur with  improper specimen collection/handling, submission of specimen other than nasopharyngeal swab, presence  of viral mutation(s) within the areas targeted by this assay, and inadequate number of viral copies(<138 copies/mL). A negative result must be combined with clinical observations, patient history, and epidemiological information. The expected result is Negative.  Fact Sheet for Patients:  EntrepreneurPulse.com.au  Fact Sheet for Healthcare Providers:  IncredibleEmployment.be  This test is no t yet approved or cleared by the [place] FDA and  has been authorized for detection and/or diagnosis of SARS-CoV-2 by FDA under an Emergency Use Authorization (EUA). This EUA will remain  in effect (meaning this test can be used) for the duration of the COVID-19 declaration under Section 564(b)(1) of the Act, 21 U.S.C.section 360bbb-3(b)(1), unless the authorization is terminated  or revoked sooner.       Influenza A by PCR NEGATIVE NEGATIVE Final   Influenza B by PCR NEGATIVE NEGATIVE Final    Comment: (NOTE) The Xpert Xpress SARS-CoV-2/FLU/RSV plus assay is intended as an aid in the diagnosis of influenza from Nasopharyngeal swab specimens and should not be used as a sole basis for treatment. Nasal washings and aspirates are unacceptable for Xpert Xpress SARS-CoV-2/FLU/RSV testing.  Fact Sheet for Patients: EntrepreneurPulse.com.au  Fact Sheet for Healthcare Providers: IncredibleEmployment.be  This test is not yet approved or cleared by the [place] FDA and has been authorized for detection and/or diagnosis of SARS-CoV-2 by FDA under an Emergency Use Authorization (EUA). This EUA will remain in effect (meaning this test can be used) for the duration of the COVID-19 declaration under Section 564(b)(1) of the Act, 21 U.S.C. section 360bbb-3(b)(1), unless the authorization is terminated or revoked.  Performed at [facility], [address] [place], [place] [address]   Culture, blood (routine  x 2) Call MD if unable to obtain prior to antibiotics being given     Status: None (Preliminary result)   Collection Time: 09/11/22  5:22 AM   Specimen: BLOOD  Result Value Ref Range Status   Specimen Description BLOOD RIGHT FA  Final   Special Requests   Final    BOTTLES DRAWN AEROBIC AND ANAEROBIC Blood Culture results may not be optimal due to an inadequate volume of blood received in culture bottles   Culture   Final    NO GROWTH 1 DAY Performed at [facility], [address], [place], [place] [address]    Report Status PENDING  Incomplete    Coagulation Studies: No results for input(s): "LABPROT", "INR" in the last 72 hours.  Urinalysis: No results for input(s): "COLORURINE", "LABSPEC", "PHURINE", "GLUCOSEU", "HGBUR", "BILIRUBINUR", "KETONESUR", "PROTEINUR", "UROBILINOGEN", "NITRITE", "LEUKOCYTESUR" in the last 72 hours.  Invalid input(s): "APPERANCEUR"    Imaging: ECHOCARDIOGRAM COMPLETE  Result Date: 09/11/2022    ECHOCARDIOGRAM REPORT   Patient Name:   Jerry [name] Date of Exam: 09/11/2022 Medical Rec #:  [id number]              Height:       66.0 in Accession #:    [id number]             Weight:       172.0 lb Date of Birth:  August 29, 1966  BSA:          1.875 m Patient Age:    25 years               BP:           134/68 mmHg Patient Gender: M                      HR:           70 bpm. Exam Location:  [facility] Procedure: 2D Echo, Cardiac Doppler and Color Doppler Indications:     CHF-acute diastolic M62.94  History:         Patient has no prior history of Echocardiogram examinations.                  Risk Factors:Diabetes and Hypertension.  Sonographer:     Sherrie Sport Referring Phys:  [id number] Athena Masse Diagnosing Phys: Nelva Bush MD IMPRESSIONS  1. Left ventricular ejection fraction, by estimation, is 45 to 50%. The left ventricle has mildly decreased function. The left ventricle demonstrates global hypokinesis. Left ventricular diastolic  parameters are consistent with Grade II diastolic dysfunction (pseudonormalization).  2. Right ventricular systolic function is mildly reduced. The right ventricular size is moderately enlarged. There is severely elevated pulmonary artery systolic pressure.  3. Left atrial size was mildly dilated.  4. Right atrial size was moderately dilated.  5. Moderate pleural effusion.  6. The mitral valve is abnormal. Mild mitral valve regurgitation. Mild mitral stenosis. The mean mitral valve gradient is 5.0 mmHg.  7. Tricuspid valve regurgitation is moderate to severe.  8. The aortic valve was not well visualized. There is mild calcification of the aortic valve. There is mild thickening of the aortic valve. Aortic valve regurgitation is not visualized. Aortic valve sclerosis/calcification is present, without any evidence of aortic stenosis.  9. The inferior vena cava is normal in size with <50% respiratory variability, suggesting right atrial pressure of 8 mmHg. FINDINGS  Left Ventricle: Left ventricular ejection fraction, by estimation, is 45 to 50%. The left ventricle has mildly decreased function. The left ventricle demonstrates global hypokinesis. The left ventricular internal cavity size was normal in size. There is  no left ventricular hypertrophy. Left ventricular diastolic parameters are consistent with Grade II diastolic dysfunction (pseudonormalization). Right Ventricle: The right ventricular size is moderately enlarged. No increase in right ventricular wall thickness. Right ventricular systolic function is mildly reduced. There is severely elevated pulmonary artery systolic pressure. The tricuspid regurgitant velocity is 4.70 m/s, and with an assumed right atrial pressure of 8 mmHg, the estimated right ventricular systolic pressure is 35.4 mmHg. Left Atrium: Left atrial size was mildly dilated. Right Atrium: Right atrial size was moderately dilated. Pericardium: There is no evidence of pericardial effusion. Mitral  Valve: The mitral valve is abnormal. There is severe thickening of the posterior mitral valve leaflet(s). There is moderate calcification of the posterior mitral valve leaflet(s). Mild mitral valve regurgitation. Mild mitral valve stenosis. MV peak gradient, 10.4 mmHg. The mean mitral valve gradient is 5.0 mmHg. Tricuspid Valve: The tricuspid valve is normal in structure. Tricuspid valve regurgitation is moderate to severe. Aortic Valve: The aortic valve was not well visualized. There is mild calcification of the aortic valve. There is mild thickening of the aortic valve. Aortic valve regurgitation is not visualized. Aortic valve sclerosis/calcification is present, without any evidence of aortic stenosis. Aortic valve mean gradient measures 6.3 mmHg. Aortic valve peak gradient measures 10.8 mmHg. Aortic valve  area, by VTI measures 1.83 cm. Pulmonic Valve: The pulmonic valve was normal in structure. Pulmonic valve regurgitation is trivial. No evidence of pulmonic stenosis. Aorta: The aortic root is normal in size and structure. Pulmonary Artery: The pulmonary artery is not well seen. Venous: The inferior vena cava is normal in size with less than 50% respiratory variability, suggesting right atrial pressure of 8 mmHg. IAS/Shunts: No atrial level shunt detected by color flow Doppler. Additional Comments: There is a moderate pleural effusion.  LEFT VENTRICLE PLAX 2D LVIDd:         5.40 cm   Diastology LVIDs:         3.90 cm   LV e' medial:    5.00 cm/s LV PW:         1.00 cm   LV E/e' medial:  24.4 LV IVS:        1.00 cm   LV e' lateral:   7.62 cm/s LVOT diam:     2.00 cm   LV E/e' lateral: 16.0 LV SV:         54 LV SV Index:   29 LVOT Area:     3.14 cm  RIGHT VENTRICLE RV Basal diam:  5.50 cm RV Mid diam:    4.20 cm RV S prime:     9.25 cm/s TAPSE (M-mode): 1.7 cm LEFT ATRIUM             Index        RIGHT ATRIUM           Index LA diam:        5.40 cm 2.88 cm/m   RA Area:     25.30 cm LA Vol (A2C):   70.5 ml  37.59 ml/m  RA Volume:   90.90 ml  48.47 ml/m LA Vol (A4C):   63.4 ml 33.81 ml/m LA Biplane Vol: 71.3 ml 38.02 ml/m  AORTIC VALVE                     PULMONIC VALVE AV Area (Vmax):    1.62 cm      PR End Diast Vel: 1.86 msec AV Area (Vmean):   1.61 cm AV Area (VTI):     1.83 cm AV Vmax:           164.33 cm/s AV Vmean:          114.000 cm/s AV VTI:            0.294 m AV Peak Grad:      10.8 mmHg AV Mean Grad:      6.3 mmHg LVOT Vmax:         84.60 cm/s LVOT Vmean:        58.500 cm/s LVOT VTI:          0.171 m LVOT/AV VTI ratio: 0.58  AORTA Ao Root diam: 2.80 cm MITRAL VALVE                TRICUSPID VALVE MV Area (PHT): 3.63 cm     TR Peak grad:   88.4 mmHg MV Area VTI:   1.16 cm     TR Vmax:        470.00 cm/s MV Peak grad:  10.4 mmHg MV Mean grad:  5.0 mmHg     SHUNTS MV Vmax:       1.61 m/s     Systemic VTI:  0.17 m MV Vmean:      108.0 cm/s   Systemic  Diam: 2.00 cm MV Decel Time: 209 msec MV E velocity: 122.00 cm/s MV A velocity: 111.00 cm/s MV E/A ratio:  1.10 Nelva Bush MD Electronically signed by Nelva Bush MD Signature Date/Time: 09/11/2022/7:22:30 PM    Final    NM Pulmonary Perfusion  Result Date: 09/11/2022 CLINICAL DATA:  Intermediate probability EXAM: NUCLEAR MEDICINE PERFUSION LUNG SCAN TECHNIQUE: Perfusion images were obtained in multiple projections after intravenous injection of radiopharmaceutical. Ventilation scans intentionally deferred if perfusion scan and chest x-ray adequate for interpretation during COVID 19 epidemic. RADIOPHARMACEUTICALS:  4.39 mCi Tc-17m MAA IV COMPARISON:  Chest x-ray dated September 10, 2022 FINDINGS: Heterogeneous perfusion of the bilateral lungs which is likely due to diffuse airspace disease seen on prior chest x-ray. Assessment for segmental perfusion defects is limited and findings are nondiagnostic. IMPRESSION: Nondiagnostic exam. Recommend further evaluation with chest CTA. Electronically Signed   By: Yetta Glassman M.D.   On: 09/11/2022  11:20   US Venous Img Lower Bilateral (DVT)  Result Date: 09/11/2022 CLINICAL DATA:  56 year old male with history of acute respiratory failure with hypoxia. Elevated D-dimer. EXAM: BILATERAL LOWER EXTREMITY VENOUS DOPPLER ULTRASOUND TECHNIQUE: Gray-scale sonography with compression, as well as color and duplex ultrasound, were performed to evaluate the deep venous system(s) from the level of the common femoral vein through the popliteal and proximal calf veins. COMPARISON:  None Available. FINDINGS: VENOUS Normal compressibility of the common femoral, superficial femoral, and popliteal veins, as well as the visualized calf veins. Visualized portions of profunda femoral vein and great saphenous vein unremarkable. No filling defects to suggest DVT on grayscale or color Doppler imaging. Doppler waveforms show normal direction of venous flow, normal respiratory plasticity and response to augmentation. Limited views of the contralateral common femoral vein are unremarkable. OTHER None. Limitations: none IMPRESSION: Negative. Electronically Signed   By: Vinnie Langton M.D.   On: 09/11/2022 06:22   DG Chest 2 View  Result Date: 09/10/2022 CLINICAL DATA:  Chest pain EXAM: CHEST - 2 VIEW COMPARISON:  07/19/2022 FINDINGS: Enlarged cardiac silhouette. Ectatic aorta. There is patchy airspace disease superimposed on interstitial chronic lung disease. Increased opacities in the LEFT and RIGHT lower lobe. No pneumothorax. IMPRESSION: Patchy bilateral airspace disease superimposed on interstitial lung disease. Findings concerning for multifocal pneumonia versus pulmonary edema. Electronically Signed   By: Suzy Bouchard M.D.   On: 09/10/2022 16:09     Medications:    anticoagulant sodium citrate     azithromycin 500 mg (09/12/22 0160)   cefTRIAXone (ROCEPHIN)  IV Stopped (09/12/22 1093)    Chlorhexidine Gluconate Cloth  6 each Topical Q0600   gabapentin  300 mg Oral Q M,W,F-1800   guaiFENesin  600 mg Oral  BID   heparin  5,000 Units Subcutaneous Q8H   insulin aspart  0-5 Units Subcutaneous QHS   insulin aspart  0-6 Units Subcutaneous TID WC   acetaminophen **OR** acetaminophen, albuterol, alteplase, anticoagulant sodium citrate, heparin, hydrALAZINE, lidocaine (PF), lidocaine-prilocaine, ondansetron **OR** ondansetron (ZOFRAN) IV, pentafluoroprop-tetrafluoroeth, traMADol  Assessment/ Plan:  Jerry [name] is a 56 y.o.  male with past medical conditions including hypertension, chronic back pain, diabetes, and end-stage renal disease on hemodialysis.  Patient presents to the emergency department from his dialysis center with shortness of breath and weakness.  Patient has been admitted for Acute pulmonary edema (HCC) [J81.0] Pneumonia [J18.9] Acute respiratory failure with hypoxia (HCC) [J96.01] Fluid overload [E87.70]  [facility]/MWF/left aVF  End-stage renal disease on hemodialysis.  Last treatment completed on Monday.  Receiving scheduled dialysis treatment today, UF goal 2 L increased to 3 L per patient request.  Next treatment scheduled for Friday.  2. Anemia of chronic kidney disease Lab Results  Component Value Date   HGB 8.9 (L) 09/12/2022    Hemoglobin below desired target.  Will continue to monitor.  Patient receives Mircera at outpatient clinic.  3. Secondary Hyperparathyroidism: with outpatient labs: PTH 818, phosphorus 9.1, calcium 8.9 on 08/27/22.   Lab Results  Component Value Date   CALCIUM 9.2 09/12/2022   PHOS 3.6 04/13/2021    Will continue to monitor bowel minerals during this admission.  Currently prescribed calcium carbonate and Renvela outpatient.  Will continue sevelamer with meals.  4.  Community-acquired pneumonia, suspected.  Chest x-ray suspicious for multifocal pneumonia.  Respiratory panel negative.  Currently receiving Zithromax and ceftriaxone per primary team.   LOS: [place] 11/29/202311:43 AM

## 2022-09-12 NOTE — Progress Notes (Signed)
       CROSS COVER NOTE  NAME: Jerry [name] MRN: [id number] DOB : [birth year] ATTENDING PHYSICIAN: Annita Brod, MD    Date of Service   09/12/2022   HPI/Events of Note   Patient requesting home med for acid reflux.  Interventions   Assessment/Plan:  Protonix x1     This document was prepared using Systems analyst and may include unintentional dictation errors.  Neomia Glass DNP, MBA, FNP-BC Nurse Practitioner [facility] Pager [phone]

## 2022-09-12 NOTE — Discharge Planning (Signed)
ESTABLISHED HEMODIALYSIS: Outpatient Facility [facility]  [address] [place],  [address] [phone]  Scheduled Days: Monday Wednesday and Friday  Treatment Time: 10:00am

## 2022-09-13 ENCOUNTER — Inpatient Hospital Stay: Payer: Medicaid Other

## 2022-09-13 LAB — HEPATITIS B SURFACE ANTIBODY, QUANTITATIVE: Hep B S AB Quant (Post): 47.4 m[IU]/mL (ref >9.9)

## 2022-09-13 LAB — GLUCOSE, CAPILLARY
Glucose-Capillary: 119 mg/dL — ABNORMAL HIGH (ref 70–99)
Glucose-Capillary: 140 mg/dL — ABNORMAL HIGH (ref 70–99)
Glucose-Capillary: 133 mg/dL — ABNORMAL HIGH (ref 70–99)
Glucose-Capillary: 129 mg/dL — ABNORMAL HIGH (ref 70–99)

## 2022-09-13 LAB — PROCALCITONIN: Procalcitonin: 8.3 ng/mL

## 2022-09-13 LAB — HEPATITIS B E ANTIBODY: Hep B E Ab: NEGATIVE

## 2022-09-13 MED ORDER — SEVELAMER CARBONATE 800 MG PO TABS
1600.0000 mg | ORAL_TABLET | Freq: Three times a day (TID) | ORAL | Status: DC
  Administered 2022-09-13 – 2022-09-20 (×19): 1600 mg via ORAL
  Filled 2022-09-13 (×19): qty 2

## 2022-09-13 NOTE — Progress Notes (Signed)
[facility]  ROUNDING NOTE   Subjective:   Jerry [name] is a 56 year old Spanish-speaking male with past medical conditions including hypertension, chronic back pain, diabetes, and end-stage renal disease on hemodialysis.  Patient presents to the emergency department from his dialysis center with shortness of breath and weakness.  Patient has been admitted for Acute pulmonary edema (HCC) [J81.0] Pneumonia [J18.9] Acute respiratory failure with hypoxia ([place]) [J96.01] Fluid overload [E87.70]  Patient is known to our practice and receives outpatient dialysis treatments at [facility] on a MWF schedule, supervised by Dr. Candiss Norse.   Patient seen resting quietly in bed, family at bedside Alert and oriented Continues to complain of fatigue and chest soreness No lower extremity edema Tolerated dialysis well yesterday Remains on 3 L nasal cannula   Objective:  Vital signs in last 24 hours:  Temp:  [98 F (36.7 C)-100.6 F (38.1 C)] 99.4 F (37.4 C) (11/30 0835) Pulse Rate:  [76-87] 76 (11/30 0835) Resp:  [16-20] 18 (11/30 0835) BP: (102-174)/(50-74) 142/73 (11/30 0835) SpO2:  [91 %-99 %] 98 % (11/30 0835)  Weight change: 3.5 kg Filed Weights   09/10/22 1544 09/11/22 2008 09/12/22 0940  Weight: 78 kg 78.6 kg 82.1 kg    Intake/Output: I/O last 3 completed shifts: In: 705.2 [P.O.:100; IV Piggyback:605.2] Out: 3500 [Other:3500]   Intake/Output this shift:  No intake/output data recorded.  Physical Exam: General: NAD ill-appearing  Head: Normocephalic, atraumatic. Moist oral mucosal membranes  Eyes: Anicteric  Lungs:  Basilar crackles, normal effort, [place] O2  Heart: Regular rate and rhythm  Abdomen:  Soft, nontender, nondistended  Extremities: No peripheral edema.  Neurologic: Nonfocal, moving all four extremities  Skin: No lesions  Access: Left aVF    Basic Metabolic Panel: Recent Labs  Lab 09/10/22 1550 09/11/22 0252 09/12/22 0607   NA 135 130* 132*  K 4.2 4.8 5.5*  CL 96* 95* 96*  CO2 21* 20* 17*  GLUCOSE 126* 165* 125*  BUN 66* 83* 99*  CREATININE 6.71* 8.35* 10.60*  CALCIUM 9.4 9.0 9.2     Liver Function Tests: No results for input(s): "AST", "ALT", "ALKPHOS", "BILITOT", "PROT", "ALBUMIN" in the last 168 hours. No results for input(s): "LIPASE", "AMYLASE" in the last 168 hours. No results for input(s): "AMMONIA" in the last 168 hours.  CBC: Recent Labs  Lab 09/10/22 1550 09/11/22 0252 09/12/22 0607  WBC 8.0 7.8 9.1  HGB 10.3* 9.2* 8.9*  HCT 28.9* 26.5* 25.7*  MCV 96.0 97.1 98.8  PLT 200 185 168     Cardiac Enzymes: No results for input(s): "CKTOTAL", "CKMB", "CKMBINDEX", "TROPONINI" in the last 168 hours.  BNP: Invalid input(s): "POCBNP"  CBG: Recent Labs  Lab 09/12/22 0805 09/12/22 1604 09/12/22 2135 09/13/22 0836 09/13/22 1150  GLUCAP 113* 137* 162* 119* 140*     Microbiology: Results for orders placed or performed during the hospital encounter of 09/11/22  Resp Panel by RT-PCR (Flu A&B, Covid) Anterior Nasal Swab     Status: None   Collection Time: 09/11/22 12:31 AM   Specimen: Anterior Nasal Swab  Result Value Ref Range Status   SARS Coronavirus 2 by RT PCR NEGATIVE NEGATIVE Final    Comment: (NOTE) SARS-CoV-2 target nucleic acids are NOT DETECTED.  The SARS-CoV-2 RNA is generally detectable in upper respiratory specimens during the acute phase of infection. The lowest concentration of SARS-CoV-2 viral copies this assay can detect is 138 copies/mL. A negative result does not preclude SARS-Cov-2 infection and should not be used as  the sole basis for treatment or other patient management decisions. A negative result may occur with  improper specimen collection/handling, submission of specimen other than nasopharyngeal swab, presence of viral mutation(s) within the areas targeted by this assay, and inadequate number of viral copies(<138 copies/mL). A negative result must  be combined with clinical observations, patient history, and epidemiological information. The expected result is Negative.  Fact Sheet for Patients:  EntrepreneurPulse.com.au  Fact Sheet for Healthcare Providers:  IncredibleEmployment.be  This test is no t yet approved or cleared by the [place] FDA and  has been authorized for detection and/or diagnosis of SARS-CoV-2 by FDA under an Emergency Use Authorization (EUA). This EUA will remain  in effect (meaning this test can be used) for the duration of the COVID-19 declaration under Section 564(b)(1) of the Act, 21 U.S.C.section 360bbb-3(b)(1), unless the authorization is terminated  or revoked sooner.       Influenza A by PCR NEGATIVE NEGATIVE Final   Influenza B by PCR NEGATIVE NEGATIVE Final    Comment: (NOTE) The Xpert Xpress SARS-CoV-2/FLU/RSV plus assay is intended as an aid in the diagnosis of influenza from Nasopharyngeal swab specimens and should not be used as a sole basis for treatment. Nasal washings and aspirates are unacceptable for Xpert Xpress SARS-CoV-2/FLU/RSV testing.  Fact Sheet for Patients: EntrepreneurPulse.com.au  Fact Sheet for Healthcare Providers: IncredibleEmployment.be  This test is not yet approved or cleared by the [place] FDA and has been authorized for detection and/or diagnosis of SARS-CoV-2 by FDA under an Emergency Use Authorization (EUA). This EUA will remain in effect (meaning this test can be used) for the duration of the COVID-19 declaration under Section 564(b)(1) of the Act, 21 U.S.C. section 360bbb-3(b)(1), unless the authorization is terminated or revoked.  Performed at [facility], [address] [place], [place] [address]   Culture, blood (routine x 2) Call MD if unable to obtain prior to antibiotics being given     Status: None (Preliminary result)   Collection Time: 09/11/22  5:22 AM    Specimen: BLOOD  Result Value Ref Range Status   Specimen Description BLOOD RIGHT FA  Final   Special Requests   Final    BOTTLES DRAWN AEROBIC AND ANAEROBIC Blood Culture results may not be optimal due to an inadequate volume of blood received in culture bottles   Culture   Final    NO GROWTH 2 DAYS Performed at [facility], [address] [place], [place] [address]    Report Status PENDING  Incomplete  Culture, blood (routine x 2) Call MD if unable to obtain prior to antibiotics being given     Status: None (Preliminary result)   Collection Time: 09/12/22  6:07 AM   Specimen: BLOOD RIGHT HAND  Result Value Ref Range Status   Specimen Description BLOOD RIGHT HAND  Final   Special Requests   Final    BOTTLES DRAWN AEROBIC AND ANAEROBIC Blood Culture adequate volume   Culture   Final    NO GROWTH 1 DAY Performed at [facility], [address], [place], [place] [address]    Report Status PENDING  Incomplete    Coagulation Studies: No results for input(s): "LABPROT", "INR" in the last 72 hours.  Urinalysis: No results for input(s): "COLORURINE", "LABSPEC", "PHURINE", "GLUCOSEU", "HGBUR", "BILIRUBINUR", "KETONESUR", "PROTEINUR", "UROBILINOGEN", "NITRITE", "LEUKOCYTESUR" in the last 72 hours.  Invalid input(s): "APPERANCEUR"    Imaging: No results found.   Medications:    anticoagulant sodium citrate  ceFEPime (MAXIPIME) IV Stopped (09/12/22 2023)    Chlorhexidine Gluconate Cloth  6 each Topical Q0600   gabapentin  300 mg Oral Q M,W,F-1800   guaiFENesin  600 mg Oral BID   heparin  5,000 Units Subcutaneous Q8H   insulin aspart  0-5 Units Subcutaneous QHS   insulin aspart  0-6 Units Subcutaneous TID WC   sevelamer carbonate  1,600 mg Oral TID WC   acetaminophen **OR** acetaminophen, albuterol, alteplase, anticoagulant sodium citrate, heparin, hydrALAZINE, HYDROmorphone (DILAUDID) injection, lidocaine (PF), lidocaine-prilocaine, ondansetron  **OR** ondansetron (ZOFRAN) IV, pentafluoroprop-tetrafluoroeth, traMADol  Assessment/ Plan:  Mr. Jerry [name] is a 56 y.o.  male with past medical conditions including hypertension, chronic back pain, diabetes, and end-stage renal disease on hemodialysis.  Patient presents to the emergency department from his dialysis center with shortness of breath and weakness.  Patient has been admitted for Acute pulmonary edema (HCC) [J81.0] Pneumonia [J18.9] Acute respiratory failure with hypoxia (HCC) [J96.01] Fluid overload [E87.70]  [facility]/MWF/left aVF  End-stage renal disease on hemodialysis.  Last treatment completed on Monday.  Received dialysis yesterday, UF 3.5 L achieved.  Next treatment scheduled for Friday.  2. Anemia of chronic kidney disease Lab Results  Component Value Date   HGB 8.9 (L) 09/12/2022    Patient receives Mircera at outpatient clinic.  Hemoglobin below goal.  Monitoring  3. Secondary Hyperparathyroidism: with outpatient labs: PTH 818, phosphorus 9.1, calcium 8.9 on 08/27/22.   Lab Results  Component Value Date   CALCIUM 9.2 09/12/2022   PHOS 3.6 04/13/2021     Currently prescribed calcium carbonate and Renvela outpatient.  Calcium within desired goal at this time.  4.  Community-acquired pneumonia, suspected.  Chest x-ray suspicious for multifocal pneumonia.  Respiratory panel negative.  Currently receiving Zithromax and ceftriaxone per primary team.  CT chest pending   LOS: 2 [place] 11/30/20231:38 PM

## 2022-09-13 NOTE — TOC Progression Note (Signed)
Transition of Care [facility]) - Progression Note    Patient Details  Name: Jerry [name] MRN: [id number] Date of Birth: 1966-06-21  Transition of Care [facility]) CM/SW Contact  Beverly Sessions, RN Phone Number: 09/13/2022, 10:29 AM  Clinical Narrative:     Still on acute O2  Expected Discharge Plan: Home/Self Care    Expected Discharge Plan and Services Expected Discharge Plan: Home/Self Care     Post Acute Care Choice: Durable Medical Equipment                                         Social Determinants of Health (SDOH) Interventions    Readmission Risk Interventions     No data to display

## 2022-09-13 NOTE — Progress Notes (Signed)
[facility] Progress Note  Patient: Jerry [name]    UXN:[id number]  DOA: 09/11/2022    Date of Service: the patient was seen and examined on 09/13/2022  Brief hospital course: 56 year old Spanish-speaking male with past medical history of end-stage renal disease on hemodialysis and hypertension hospitalized 2 months ago for acute GI bleed from gastric ulcers requiring 3 units packed red blood cells who presented to the emergency room on 11/27 afternoon from dialysis with shortness of breath.  Patient had completed all previous sessions of dialysis.  Workup revealed mild hypoxia and underlying multifocal airway disease and elevated procalcitonin consistent with pneumonia as well as hypertensive urgency with systolic blood pressure of 170's.  Patient admitted to the hospitalist service.  By 11/29, patient with little improvement.  Follow-up procalcitonin that day had increased to 4.74 (previously 2.3) and Rocephin/Zithromax changed to cefepime.  Although only receiving 1 dose, procalcitonin up to 8.3 by following day and CT scan of chest ordered.  By following day, patient with only mild improvement and worsening procalcitonin and antibiotics adjusted.  Assessment and Plan: Multi lobar pneumonia causing acute respiratory failure with hypoxia: Sepsis and pulmonary embolus ruled out.  Continue oxygen, 3 L nasal cannula.  Given worsening procalcitonin, antibiotics increased to cefepime.  Procalcitonin doubling by following morning, although patient had only received 1 dose of cefepime 7 hours prior to lab draw.  CT scan of chest done this morning with results pending.  Hopefully does not have empyema.  End-stage renal disease on hemodialysis: Nephrology following.  Diabetes mellitus: Diet controlled  Malignant hypertension: Multifactorial secondary to pain, shortness of breath as well as increasing volume overload, better with dialysis  Overweight: Meets criteria for BMI greater than  25   Body mass index is 29.21 kg/m.        Consultants: Nephrology  Procedures: Hemodialysis  Antimicrobials: IV Rocephin and Zithromax 11/28 - 11/29 IV cefepime 11/29-present  Code Status: Full code   Subjective: (Through [place] interpreter) continues to complain of some mild shortness of breath and chest pain at right lung base  Objective:  Vitals:   09/13/22 0334 09/13/22 0835  BP: (!) 102/50 (!) 142/73  Pulse: 84 76  Resp: 20 18  Temp: 99.8 F (37.7 C) 99.4 F (37.4 C)  SpO2: 91% 98%    Intake/Output Summary (Last 24 hours) at 09/13/2022 1340 Last data filed at 09/13/2022 0345 Gross per 24 hour  Intake 450 ml  Output --  Net 450 ml    Filed Weights   09/10/22 1544 09/11/22 2008 09/12/22 0940  Weight: 78 kg 78.6 kg 82.1 kg   Body mass index is 29.21 kg/m.  Exam:  General: Alert and oriented x 3, fatigued HEENT: Normocephalic, atraumatic, mucous membranes slightly dry Cardiovascular: Regular rate and rhythm, S1-S2 Respiratory: Decreased breath sounds with few crackles on right base Abdomen: Soft, nontender, nondistended, positive bowel Musculoskeletal: No clubbing or cyanosis or any Skin: No skin breaks, tears or lesion Psychiatry: Appropriate, no evidence of psychoses Neurology: No focal deficits  Data Reviewed: Procalcitonin continues to trend upward  Disposition:  Status is: Inpatient Remains inpatient appropriate because:  -Improvement in hypoxia -Treatment of pneumonia -Results of CT    Anticipated discharge date: 12/3  Family Communication: Updated wife at bed side on 11/29 DVT Prophylaxis: heparin injection 5,000 Units Start: 09/11/22 0600    Author: Annita Brod ,MD 09/13/2022 1:40 PM  To reach On-call, see care teams to locate the attending and reach out via [email or web address]. Between 7PM-7AM, please  contact night-coverage If you still have difficulty reaching the attending provider, please page the [facility] (Director on  Call) for [facility] on amion for assistance.

## 2022-09-14 DIAGNOSIS — Z515 Encounter for palliative care: Secondary | ICD-10-CM

## 2022-09-14 DIAGNOSIS — E8770 Fluid overload, unspecified: Secondary | ICD-10-CM

## 2022-09-14 DIAGNOSIS — Z789 Other specified health status: Secondary | ICD-10-CM

## 2022-09-14 LAB — CBC
HCT: 24.8 % — ABNORMAL LOW (ref 39.0–52.0)
Hemoglobin: 8.6 g/dL — ABNORMAL LOW (ref 13.0–17.0)
MCH: 33.6 pg (ref 26.0–34.0)
MCHC: 34.7 g/dL (ref 30.0–36.0)
MCV: 96.9 fL (ref 80.0–100.0)
Platelets: 147 10*3/uL — ABNORMAL LOW (ref 150–400)
RBC: 2.56 MIL/uL — ABNORMAL LOW (ref 4.22–5.81)
RDW: 19.7 % — ABNORMAL HIGH (ref 11.5–15.5)
WBC: 6.4 10*3/uL (ref 4.0–10.5)
nRBC: 0 % (ref 0.0–0.2)

## 2022-09-14 LAB — BASIC METABOLIC PANEL
Anion gap: 18 — ABNORMAL HIGH (ref 5–15)
BUN: 91 mg/dL — ABNORMAL HIGH (ref 6–20)
CO2: 20 mmol/L — ABNORMAL LOW (ref 22–32)
Calcium: 9 mg/dL (ref 8.9–10.3)
Chloride: 91 mmol/L — ABNORMAL LOW (ref 98–111)
Creatinine, Ser: 9.41 mg/dL — ABNORMAL HIGH (ref 0.61–1.24)
GFR, Estimated: 6 mL/min — ABNORMAL LOW (ref >60)
Glucose, Bld: 109 mg/dL — ABNORMAL HIGH (ref 70–99)
Potassium: 4.8 mmol/L (ref 3.5–5.1)
Sodium: 129 mmol/L — ABNORMAL LOW (ref 135–145)

## 2022-09-14 LAB — SURGICAL PCR SCREEN
MRSA, PCR: NEGATIVE
Staphylococcus aureus: NEGATIVE

## 2022-09-14 LAB — GLUCOSE, CAPILLARY
Glucose-Capillary: 113 mg/dL — ABNORMAL HIGH (ref 70–99)
Glucose-Capillary: 113 mg/dL — ABNORMAL HIGH (ref 70–99)
Glucose-Capillary: 168 mg/dL — ABNORMAL HIGH (ref 70–99)
Glucose-Capillary: 209 mg/dL — ABNORMAL HIGH (ref 70–99)

## 2022-09-14 LAB — PROCALCITONIN: Procalcitonin: 9.57 ng/mL

## 2022-09-14 MED ORDER — LIDOCAINE 5 % EX PTCH
1.0000 | MEDICATED_PATCH | CUTANEOUS | Status: DC
  Administered 2022-09-14 – 2022-09-23 (×9): 1 via TRANSDERMAL
  Filled 2022-09-14 (×9): qty 1

## 2022-09-14 MED ORDER — VANCOMYCIN HCL 1500 MG/300ML IV SOLN
1500.0000 mg | Freq: Once | INTRAVENOUS | Status: AC
  Administered 2022-09-14: 1500 mg via INTRAVENOUS
  Filled 2022-09-14 (×2): qty 300

## 2022-09-14 MED ORDER — METHOCARBAMOL 500 MG PO TABS
500.0000 mg | ORAL_TABLET | Freq: Three times a day (TID) | ORAL | Status: DC
  Administered 2022-09-14 – 2022-09-15 (×5): 500 mg via ORAL
  Filled 2022-09-14 (×6): qty 1

## 2022-09-14 MED ORDER — VANCOMYCIN HCL IN DEXTROSE 1-5 GM/200ML-% IV SOLN: 1000.0000 mg | INTRAVENOUS | Status: DC

## 2022-09-14 MED ORDER — ORAL CARE MOUTH RINSE: 15.0000 mL | OROMUCOSAL | Status: DC | PRN

## 2022-09-14 MED ORDER — ARFORMOTEROL TARTRATE 15 MCG/2ML IN NEBU
15.0000 ug | INHALATION_SOLUTION | Freq: Two times a day (BID) | RESPIRATORY_TRACT | Status: DC
  Administered 2022-09-15 – 2022-09-18 (×6): 15 ug via RESPIRATORY_TRACT
  Filled 2022-09-14 (×8): qty 2

## 2022-09-14 MED ORDER — BUDESONIDE 0.25 MG/2ML IN SUSP
0.2500 mg | Freq: Two times a day (BID) | RESPIRATORY_TRACT | Status: DC
  Administered 2022-09-14 – 2022-09-18 (×8): 0.25 mg via RESPIRATORY_TRACT
  Filled 2022-09-14 (×8): qty 2

## 2022-09-14 MED ORDER — METHYLPREDNISOLONE SODIUM SUCC 125 MG IJ SOLR
80.0000 mg | INTRAMUSCULAR | Status: DC
  Administered 2022-09-14 – 2022-09-15 (×2): 80 mg via INTRAVENOUS
  Filled 2022-09-14 (×2): qty 2

## 2022-09-14 MED ORDER — IPRATROPIUM-ALBUTEROL 0.5-2.5 (3) MG/3ML IN SOLN
3.0000 mL | Freq: Four times a day (QID) | RESPIRATORY_TRACT | Status: DC
  Administered 2022-09-14 – 2022-09-18 (×12): 3 mL via RESPIRATORY_TRACT
  Filled 2022-09-14 (×13): qty 3

## 2022-09-14 NOTE — Consult Note (Signed)
Consultation Note Date: 09/14/2022 at 1400  Patient Name: Jerry [name]  DOB: 1965/11/26  MRN: [id number]  Age / Sex: 56 y.o., male  PCP: Freddy Finner, NP Referring Physician: Sidney Ace, MD  Reason for Consultation: Establishing goals of care  HPI/Patient Profile: 56 y.o. male  with past medical history of ESRD (HD), recent hospitalization (2 months ago) for GI bleed with gastric ulcers and HTN admitted on 09/11/2022 with shortness of breath status post dialysis treatment.  Patient was being treated for multifocal airway disease.  Procalcitonin continued to elevate as well as hypertensive urgency with systolic blood pressures in the 170s.  On 12/1, CT scan revealed interstitial lung disease, severe cardiomegaly with fluid overload, and radiographic findings of ESLD/cirrhosis.  Given patient's multiorgan failure and overall poor prognosis, PMC was consulted to discuss goals of care.   Clinical Assessment and Goals of Care: I have reviewed medical records including EPIC notes, labs and imaging, assessed the patient and then met with patient, patient's wife Jerry [name], patient's daughter Jerry [name], and Dr. Priscella Mann at bedside to discuss diagnosis prognosis, [place], EOL wishes, disposition and options.  Patient and family declined use of Spanish interpreter twice.  I introduced Palliative Medicine as specialized medical care for people living with serious illness. It focuses on providing relief from the symptoms and stress of a serious illness. The goal is to improve quality of life for both the patient and the family.  We discussed a brief life review of the patient.  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  He is married and has 4 children, 2 daughters and 2 sons, who all live nearby.  We discussed patient's current illness and what it means in the larger context of patient's on-going  co-morbidities.  Education provided on ILD, ESRD, ESLD, cirrhosis, HF, and fluid overload.  Natural disease trajectory discussed.  Reviewed that these diagnoses are chronic in nature and not reversible.  However, focusing on patient's quality of life, his symptoms, and the minimizing advancement of these diseases is the current plan of care.  I attempted to elicit values and goals of care important to the patient.  Patient says he wants to know what medicine he can take to get better and when he will be able to go home.  He says he is not experience pain at rest but difficulty when taking large inhalations.  He would like to have medicine for this so that he can go home.  Advance directives, concepts specific to code status, artificial feeding and hydration, and rehospitalization were considered and discussed.  Patient stated he would never want to live on a machine.  CODE STATUS discussed in detail.  Patient shares he is likely interested in CPR and shocks but would never want to live on a machine.  Further clarification of CODE STATUS and ongoing discussion of CODE STATUS to be had.   Discussed with patient/family the importance of continued conversation with family and the medical providers regarding overall plan of care and treatment options,  ensuring decisions are within the context of the patient's values and GOCs.    Questions and concerns were addressed. The family was encouraged to call with questions or concerns.  Patient and family made aware that there is no in person PMT provider over the weekend but that my colleague will follow-up with them on Monday.  Full code and full scope remains.  Primary Decision Maker PATIENT  Physical Exam Vitals reviewed.  Constitutional:      Comments: Frail, thin  Cardiovascular:     Rate and Rhythm: Normal rate.  Pulmonary:     Effort: Pulmonary effort is normal.  Abdominal:     Palpations: Abdomen is soft.  Musculoskeletal:        General:  Normal range of motion.  Skin:    General: Skin is warm and dry.  Neurological:     Mental Status: He is alert and oriented to person, place, and time.  Psychiatric:        Mood and Affect: Mood normal. Mood is not anxious.        Behavior: Behavior normal. Behavior is not agitated.     Palliative Assessment/Data: 70%     Thank you for this consult. Palliative medicine will continue to follow and assist holistically.   Time Total: 75 minutes Greater than 50%  of this time was spent counseling and coordinating care related to the above assessment and plan.  Signed by: Jordan Hawks, DNP, FNP-BC Palliative Medicine    Please contact Palliative Medicine Team phone at [phone] for questions and concerns.  For individual provider: See Shea Evans

## 2022-09-14 NOTE — Progress Notes (Signed)
PROGRESS NOTE    Mihir Flanigan  GGY:[id number] DOB: 1966/04/10 DOA: 09/11/2022 PCP: Freddy Finner, NP    Brief Narrative:  56 year old Spanish-speaking male with past medical history of end-stage renal disease on hemodialysis and hypertension hospitalized 2 months ago for acute GI bleed from gastric ulcers requiring 3 units packed red blood cells who presented to the emergency room on 11/27 afternoon from dialysis with shortness of breath.  Patient had completed all previous sessions of dialysis.  Workup revealed mild hypoxia and underlying multifocal airway disease and elevated procalcitonin consistent with pneumonia as well as hypertensive urgency with systolic blood pressure of 170's.  Patient admitted to the hospitalist service.   By 11/29, patient with little improvement.  Follow-up procalcitonin that day had increased to 4.74 (previously 2.3) and Rocephin/Zithromax changed to cefepime.  Although only receiving 1 dose, procalcitonin up to 8.3 by following day and CT scan of chest ordered.   By following day, patient with only mild improvement and worsening procalcitonin and antibiotics adjusted.  12/1: CAT scan reviewed.  Patient with significant interstitial lung disease, severe cardiomegaly/fluid overload, radiographic findings of ESLD/cirrhosis.  Lengthy discussion between myself, patient's wife, patient's daughter, palliative care.  Explained that patient has evidence of multiorgan failure and overall prognosis remains poor.  Patient states that he would not be want to be kept on the ventilator long-term however did want chest compressions and endotracheal intubation in the setting cardiopulmonary arrest.   Assessment & Plan:   Principal Problem:   Acute dyspnea Active Problems:   CAP (community acquired pneumonia)   Elevated d-dimer, possible PE   High anion gap metabolic acidosis   Hypertensive emergency   ESRD on hemodialysis ([place])   Diabetes mellitus (HCC)    Chronic back pain   Fluid overload   PUD (peptic ulcer disease)   Pneumonia   Multi lobar pneumonia causing acute respiratory failure with hypoxia Suspected interstitial lung disease Patient's CAT scan showed diffuse interstitial disease.  No obvious consolidations.  Procalcitonin continues to increase despite cefepime.  Patient remains on 3 L nasal cannula.  His main complaint is right-sided chest wall tenderness. Plan: Continue cefepime.  Add vancomycin.  Check MRSA screen, if negative can DC vancomycin.  Add IV Solu-Medrol 80 mg daily.  DuoNebs every 6 hours.  Twice daily Brovana, twice daily Pulmicort.  Aggressive pulmonary toileting.  Prognosis remains guarded.  End-stage renal disease on hemodialysis Nephrology following.  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  Volume removal with dialysis.  Multiorgan failure Functional decline Patient with evidence of advanced heart failure, kidney failure, liver failure.  Also with findings of interstitial lung disease on chest imaging.  Prognosis guarded.  Palliative care consulted.   DVT prophylaxis: SQ heparin Code Status: Full Family Communication: Wife and daughter at bedside 12/1 Disposition Plan: Status is: Inpatient Remains inpatient appropriate because: Acute hypoxia.  Multifocal pneumonia on IV antibiotics  Level of care: Med-Surg  Consultants:  Nephrology Palliative care  Procedures:  None  Antimicrobials: Cefepime Vancomycin   Subjective: Seen and examined.  Wife and daughter at bedside.   Flattened affect.  Patient's main complaint  is chest wall pain on the right side.  Objective: Vitals:   09/14/22 1230 09/14/22 1301 09/14/22 1312 09/14/22 1313  BP: (!) 165/73 (!) 152/69 (!) 148/76   Pulse: 73 74 74   Resp: 13 16 14    Temp:  98.1 F (36.7 C) 98.1 F (36.7 C)   TempSrc:  Oral Oral   SpO2: 100% 100% 100%   Weight:    78.1 kg  Height:        Intake/Output Summary (Last 24 hours) at 09/14/2022 1512 Last data filed at 09/14/2022 1301 Gross per 24 hour  Intake 0 ml  Output 3000 ml  Net -3000 ml   Filed Weights   09/12/22 0940 09/14/22 0910 09/14/22 1313  Weight: 82.1 kg 81.1 kg 78.1 kg    Examination:  General exam: Appears acutely and chronically ill Respiratory system: Scattered crackles bilaterally.  Worse at bases.  Normal work of breathing.  3 L Cardiovascular system: S1-S2, RRR, 2/6 murmur, 2+ pedal edema Gastrointestinal system: Soft, NT/ND, normal bowel sounds Central nervous system: Alert and oriented. No focal neurological deficits. Extremities: Decreased power symmetrically Skin: No rashes, lesions or ulcers Psychiatry: Judgement and insight appear impaired. Mood & affect flattened.     Data Reviewed: I have personally reviewed following labs and imaging studies  CBC: Recent Labs  Lab 09/10/22 1550 09/11/22 0252 09/12/22 0607 09/14/22 0521  WBC 8.0 7.8 9.1 6.4  HGB 10.3* 9.2* 8.9* 8.6*  HCT 28.9* 26.5* 25.7* 24.8*  MCV 96.0 97.1 98.8 96.9  PLT 200 185 168 564*   Basic Metabolic Panel: Recent Labs  Lab 09/10/22 1550 09/11/22 0252 09/12/22 0607 09/14/22 0521  NA 135 130* 132* 129*  K 4.2 4.8 5.5* 4.8  CL 96* 95* 96* 91*  CO2 21* 20* 17* 20*  GLUCOSE 126* 165* 125* 109*  BUN 66* 83* 99* 91*  CREATININE 6.71* 8.35* 10.60* 9.41*  CALCIUM 9.4 9.0 9.2 9.0   GFR: Estimated Creatinine Clearance: 8.5 mL/min (A) (by C-G formula based on SCr of 9.41 mg/dL (H)). Liver Function Tests: No results for input(s): "AST", "ALT", "ALKPHOS",  "BILITOT", "PROT", "ALBUMIN" in the last 168 hours. No results for input(s): "LIPASE", "AMYLASE" in the last 168 hours. No results for input(s): "AMMONIA" in the last 168 hours. Coagulation Profile: No results for input(s): "INR", "PROTIME" in the last 168 hours. Cardiac Enzymes: No results for input(s): "CKTOTAL", "CKMB", "CKMBINDEX", "TROPONINI" in the last 168 hours. BNP (last 3 results) No results for input(s): "PROBNP" in the last 8760 hours. HbA1C: No results for input(s): "HGBA1C" in the last 72 hours. CBG: Recent Labs  Lab 09/13/22 1150 09/13/22 1639 09/13/22 2123 09/14/22 0830 09/14/22 1412  GLUCAP 140* 133* 129* 113* 113*   Lipid Profile: No results for input(s): "CHOL", "HDL", "LDLCALC", "TRIG", "CHOLHDL", "LDLDIRECT" in the last 72 hours. Thyroid Function Tests: No results for input(s): "TSH", "T4TOTAL", "FREET4", "T3FREE", "THYROIDAB" in the last 72 hours. Anemia Panel: No results for input(s): "VITAMINB12", "FOLATE", "FERRITIN", "TIBC", "IRON", "RETICCTPCT" in the last 72 hours. Sepsis Labs: Recent Labs  Lab 09/11/22 0031 09/12/22 0950 09/13/22 0511 09/14/22 0521  PROCALCITON 2.28 4.74 8.30 9.57    Recent Results (from the past 240 hour(s))  Resp Panel by RT-PCR (Flu A&B, Covid) Anterior Nasal Swab     Status: None   Collection Time: 09/11/22 12:31 AM   Specimen: Anterior Nasal Swab  Result Value Ref Range Status   SARS Coronavirus 2 by RT PCR NEGATIVE NEGATIVE Final    Comment: (NOTE)  SARS-CoV-2 target nucleic acids are NOT DETECTED.  The SARS-CoV-2 RNA is generally detectable in upper respiratory specimens during the acute phase of infection. The lowest concentration of SARS-CoV-2 viral copies this assay can detect is 138 copies/mL. A negative result does not preclude SARS-Cov-2 infection and should not be used as the sole basis for treatment or other patient management decisions. A negative result may occur with  improper specimen  collection/handling, submission of specimen other than nasopharyngeal swab, presence of viral mutation(s) within the areas targeted by this assay, and inadequate number of viral copies(<138 copies/mL). A negative result must be combined with clinical observations, patient history, and epidemiological information. The expected result is Negative.  Fact Sheet for Patients:  EntrepreneurPulse.com.au  Fact Sheet for Healthcare Providers:  IncredibleEmployment.be  This test is no t yet approved or cleared by the [place] FDA and  has been authorized for detection and/or diagnosis of SARS-CoV-2 by FDA under an Emergency Use Authorization (EUA). This EUA will remain  in effect (meaning this test can be used) for the duration of the COVID-19 declaration under Section 564(b)(1) of the Act, 21 U.S.C.section 360bbb-3(b)(1), unless the authorization is terminated  or revoked sooner.       Influenza A by PCR NEGATIVE NEGATIVE Final   Influenza B by PCR NEGATIVE NEGATIVE Final    Comment: (NOTE) The Xpert Xpress SARS-CoV-2/FLU/RSV plus assay is intended as an aid in the diagnosis of influenza from Nasopharyngeal swab specimens and should not be used as a sole basis for treatment. Nasal washings and aspirates are unacceptable for Xpert Xpress SARS-CoV-2/FLU/RSV testing.  Fact Sheet for Patients: EntrepreneurPulse.com.au  Fact Sheet for Healthcare Providers: IncredibleEmployment.be  This test is not yet approved or cleared by the [place] FDA and has been authorized for detection and/or diagnosis of SARS-CoV-2 by FDA under an Emergency Use Authorization (EUA). This EUA will remain in effect (meaning this test can be used) for the duration of the COVID-19 declaration under Section 564(b)(1) of the Act, 21 U.S.C. section 360bbb-3(b)(1), unless the authorization is terminated or revoked.  Performed at [facility], [address] [place], [place] [address]   Culture, blood (routine x 2) Call MD if unable to obtain prior to antibiotics being given     Status: None (Preliminary result)   Collection Time: 09/11/22  5:22 AM   Specimen: BLOOD  Result Value Ref Range Status   Specimen Description BLOOD RIGHT FA  Final   Special Requests   Final    BOTTLES DRAWN AEROBIC AND ANAEROBIC Blood Culture results may not be optimal due to an inadequate volume of blood received in culture bottles   Culture   Final    NO GROWTH 3 DAYS Performed at [facility], [address], [place], [place] [address]    Report Status PENDING  Incomplete  Culture, blood (routine x 2) Call MD if unable to obtain prior to antibiotics being given     Status: None (Preliminary result)   Collection Time: 09/12/22  6:07 AM   Specimen: BLOOD RIGHT HAND  Result Value Ref Range Status   Specimen Description BLOOD RIGHT HAND  Final   Special Requests   Final    BOTTLES DRAWN AEROBIC AND ANAEROBIC Blood Culture adequate volume   Culture   Final    NO GROWTH 2 DAYS Performed at [facility], [address] [place], [place] [address]    Report Status PENDING  Incomplete  Radiology Studies: CT CHEST WO CONTRAST  Result Date: 09/13/2022 CLINICAL DATA:  56 year old male presenting for evaluation of empyema and hypoxia. EXAM: CT CHEST WITHOUT CONTRAST TECHNIQUE: Multidetector CT imaging of the chest was performed following the standard protocol without IV contrast. RADIATION DOSE REDUCTION: This exam was performed according to the departmental dose-optimization program which includes automated exposure control, adjustment of the mA and/or kV according to patient size and/or use of iterative reconstruction technique. COMPARISON:  Chest radiograph from September 10, 2022. FINDINGS: Cardiovascular: Calcified aortic atherosclerosis. This is moderate to marked without dilation of the thoracic aorta.  Heart size is moderately markedly enlarged. Small pericardial effusion. Three-vessel coronary artery disease. Mitral annular and aortic valvular calcification. Mediastinum/Nodes: No signs of adenopathy in the chest. Mildly patulous esophagus. Lungs/Pleura: Diffuse interstitial thickening with irregular appearance. This is seen in the LEFT and RIGHT chest with basilar to apical gradient. No signs of bronchiectasis. No signs of lobar consolidative changes. Signs of scattered ground-glass intermixed with these areas. Airways are patent though slit like mainstem bronchi are noted bilaterally. No pneumothorax. No substantial pleural fluid. Upper Abdomen: Incidental imaging of upper abdominal contents show signs of fissural widening of hepatic fissures. Presence of ascites is noted. Small to moderate volume, incompletely assessed. Signs of end-stage renal disease. No upper abdominal lymphadenopathy aortic atherosclerosis of the abdominal aorta. Musculoskeletal: No chest wall mass or suspicious bone lesions identified. IMPRESSION: 1. Diffuse interstitial thickening with irregular appearance. This is seen in the LEFT and RIGHT chest with basilar to apical gradient. No signs of bronchiectasis. Findings are suspicious for underlying interstitial disease, difficult to exclude the possibility of superimposed pulmonary edema or multifocal infection. Continued attention on follow-up is suggested. 2. Moderate to marked cardiomegaly with valvular and coronary artery disease. 3. Findings of liver disease/potential cirrhosis and ascites. Ascites could also be due to volume overload. Correlate clinically. 4. Signs of end-stage renal disease with atrophic bilateral kidneys incompletely evaluated. 5. Aortic atherosclerosis. Aortic Atherosclerosis (ICD10-I70.0). Electronically Signed   By: Zetta Bills M.D.   On: 09/13/2022 10:47        Scheduled Meds:  Chlorhexidine Gluconate Cloth  6 each Topical Q0600   gabapentin  300 mg  Oral Q M,W,F-1800   guaiFENesin  600 mg Oral BID   heparin  5,000 Units Subcutaneous Q8H   insulin aspart  0-5 Units Subcutaneous QHS   insulin aspart  0-6 Units Subcutaneous TID WC   lidocaine  1 patch Transdermal Q24H   methocarbamol  500 mg Oral TID   methylPREDNISolone (SOLU-MEDROL) injection  40 mg Intravenous Q12H   sevelamer carbonate  1,600 mg Oral TID WC   Continuous Infusions:  anticoagulant sodium citrate     ceFEPime (MAXIPIME) IV 1 g (09/13/22 2014)   [START ON 09/17/2022] vancomycin       LOS: 3 days      Sidney Ace, MD [facility]   If 7PM-7AM, please contact night-coverage  09/14/2022, 3:12 PM

## 2022-09-14 NOTE — Progress Notes (Signed)
[facility]  ROUNDING NOTE   Subjective:   Jerry [name] is a 56 year old Spanish-speaking male with past medical conditions including hypertension, chronic back pain, diabetes, and end-stage renal disease on hemodialysis.  Patient presents to the emergency department from his dialysis center with shortness of breath and weakness.  Patient has been admitted for Acute pulmonary edema (HCC) [J81.0] Pneumonia [J18.9] Acute respiratory failure with hypoxia ([place]) [J96.01] Fluid overload [E87.70]  Patient is known to our practice and receives outpatient dialysis treatments at [facility] on a MWF schedule, supervised by Dr. Candiss Norse.   Patient seen and evaluated during dialysis   HEMODIALYSIS FLOWSHEET:  Blood Flow Rate (mL/min): 350 mL/min Arterial Pressure (mmHg): -100 mmHg Venous Pressure (mmHg): 240 mmHg TMP (mmHg): 24 mmHg Ultrafiltration Rate (mL/min): 1366 mL/min Dialysate Flow Rate (mL/min): 300 ml/min   Continues to complain of fatigue and shortness of breath on exertion.  Remains on 3 L nasal cannula  Objective:  Vital signs in last 24 hours:  Temp:  [98 F (36.7 C)-99.1 F (37.3 C)] 99.1 F (37.3 C) (12/01 0924) Pulse Rate:  [70-81] 72 (12/01 1200) Resp:  [11-20] 14 (12/01 1200) BP: (119-168)/(59-74) 168/74 (12/01 1200) SpO2:  [84 %-100 %] 100 % (12/01 1200) Weight:  [81.1 kg] 81.1 kg (12/01 0910)  Weight change:  Filed Weights   09/11/22 2008 09/12/22 0940 09/14/22 0910  Weight: 78.6 kg 82.1 kg 81.1 kg    Intake/Output: I/O last 3 completed shifts: In: 200 [P.O.:100; IV Piggyback:100] Out: 0    Intake/Output this shift:  No intake/output data recorded.  Physical Exam: General: NAD ill-appearing  Head: Normocephalic, atraumatic. Moist oral mucosal membranes  Eyes: Anicteric  Lungs:  Basilar crackles, normal effort, [place] O2  Heart: Regular rate and rhythm  Abdomen:  Soft, nontender, nondistended  Extremities: No peripheral  edema.  Neurologic: Nonfocal, moving all four extremities  Skin: No lesions  Access: Left aVF    Basic Metabolic Panel: Recent Labs  Lab 09/10/22 1550 09/11/22 0252 09/12/22 0607 09/14/22 0521  NA 135 130* 132* 129*  K 4.2 4.8 5.5* 4.8  CL 96* 95* 96* 91*  CO2 21* 20* 17* 20*  GLUCOSE 126* 165* 125* 109*  BUN 66* 83* 99* 91*  CREATININE 6.71* 8.35* 10.60* 9.41*  CALCIUM 9.4 9.0 9.2 9.0     Liver Function Tests: No results for input(s): "AST", "ALT", "ALKPHOS", "BILITOT", "PROT", "ALBUMIN" in the last 168 hours. No results for input(s): "LIPASE", "AMYLASE" in the last 168 hours. No results for input(s): "AMMONIA" in the last 168 hours.  CBC: Recent Labs  Lab 09/10/22 1550 09/11/22 0252 09/12/22 0607 09/14/22 0521  WBC 8.0 7.8 9.1 6.4  HGB 10.3* 9.2* 8.9* 8.6*  HCT 28.9* 26.5* 25.7* 24.8*  MCV 96.0 97.1 98.8 96.9  PLT 200 185 168 147*     Cardiac Enzymes: No results for input(s): "CKTOTAL", "CKMB", "CKMBINDEX", "TROPONINI" in the last 168 hours.  BNP: Invalid input(s): "POCBNP"  CBG: Recent Labs  Lab 09/13/22 0836 09/13/22 1150 09/13/22 1639 09/13/22 2123 09/14/22 0830  GLUCAP 119* 140* 133* 129* 113*     Microbiology: Results for orders placed or performed during the hospital encounter of 09/11/22  Resp Panel by RT-PCR (Flu A&B, Covid) Anterior Nasal Swab     Status: None   Collection Time: 09/11/22 12:31 AM   Specimen: Anterior Nasal Swab  Result Value Ref Range Status   SARS Coronavirus 2 by RT PCR NEGATIVE NEGATIVE Final    Comment: (NOTE) SARS-CoV-2  target nucleic acids are NOT DETECTED.  The SARS-CoV-2 RNA is generally detectable in upper respiratory specimens during the acute phase of infection. The lowest concentration of SARS-CoV-2 viral copies this assay can detect is 138 copies/mL. A negative result does not preclude SARS-Cov-2 infection and should not be used as the sole basis for treatment or other patient management decisions. A  negative result may occur with  improper specimen collection/handling, submission of specimen other than nasopharyngeal swab, presence of viral mutation(s) within the areas targeted by this assay, and inadequate number of viral copies(<138 copies/mL). A negative result must be combined with clinical observations, patient history, and epidemiological information. The expected result is Negative.  Fact Sheet for Patients:  EntrepreneurPulse.com.au  Fact Sheet for Healthcare Providers:  IncredibleEmployment.be  This test is no t yet approved or cleared by the [place] FDA and  has been authorized for detection and/or diagnosis of SARS-CoV-2 by FDA under an Emergency Use Authorization (EUA). This EUA will remain  in effect (meaning this test can be used) for the duration of the COVID-19 declaration under Section 564(b)(1) of the Act, 21 U.S.C.section 360bbb-3(b)(1), unless the authorization is terminated  or revoked sooner.       Influenza A by PCR NEGATIVE NEGATIVE Final   Influenza B by PCR NEGATIVE NEGATIVE Final    Comment: (NOTE) The Xpert Xpress SARS-CoV-2/FLU/RSV plus assay is intended as an aid in the diagnosis of influenza from Nasopharyngeal swab specimens and should not be used as a sole basis for treatment. Nasal washings and aspirates are unacceptable for Xpert Xpress SARS-CoV-2/FLU/RSV testing.  Fact Sheet for Patients: EntrepreneurPulse.com.au  Fact Sheet for Healthcare Providers: IncredibleEmployment.be  This test is not yet approved or cleared by the [place] FDA and has been authorized for detection and/or diagnosis of SARS-CoV-2 by FDA under an Emergency Use Authorization (EUA). This EUA will remain in effect (meaning this test can be used) for the duration of the COVID-19 declaration under Section 564(b)(1) of the Act, 21 U.S.C. section 360bbb-3(b)(1), unless the authorization  is terminated or revoked.  Performed at [facility], [address] [place], [place] [address]   Culture, blood (routine x 2) Call MD if unable to obtain prior to antibiotics being given     Status: None (Preliminary result)   Collection Time: 09/11/22  5:22 AM   Specimen: BLOOD  Result Value Ref Range Status   Specimen Description BLOOD RIGHT FA  Final   Special Requests   Final    BOTTLES DRAWN AEROBIC AND ANAEROBIC Blood Culture results may not be optimal due to an inadequate volume of blood received in culture bottles   Culture   Final    NO GROWTH 3 DAYS Performed at [facility], [address], [place], [place] [address]    Report Status PENDING  Incomplete  Culture, blood (routine x 2) Call MD if unable to obtain prior to antibiotics being given     Status: None (Preliminary result)   Collection Time: 09/12/22  6:07 AM   Specimen: BLOOD RIGHT HAND  Result Value Ref Range Status   Specimen Description BLOOD RIGHT HAND  Final   Special Requests   Final    BOTTLES DRAWN AEROBIC AND ANAEROBIC Blood Culture adequate volume   Culture   Final    NO GROWTH 2 DAYS Performed at [facility], [address] [place], [place] [address]    Report Status PENDING  Incomplete    Coagulation Studies: No results for  input(s): "LABPROT", "INR" in the last 72 hours.  Urinalysis: No results for input(s): "COLORURINE", "LABSPEC", "PHURINE", "GLUCOSEU", "HGBUR", "BILIRUBINUR", "KETONESUR", "PROTEINUR", "UROBILINOGEN", "NITRITE", "LEUKOCYTESUR" in the last 72 hours.  Invalid input(s): "APPERANCEUR"    Imaging: CT CHEST WO CONTRAST  Result Date: 09/13/2022 CLINICAL DATA:  56 year old male presenting for evaluation of empyema and hypoxia. EXAM: CT CHEST WITHOUT CONTRAST TECHNIQUE: Multidetector CT imaging of the chest was performed following the standard protocol without IV contrast. RADIATION DOSE REDUCTION: This exam was performed according to the  departmental dose-optimization program which includes automated exposure control, adjustment of the mA and/or kV according to patient size and/or use of iterative reconstruction technique. COMPARISON:  Chest radiograph from September 10, 2022. FINDINGS: Cardiovascular: Calcified aortic atherosclerosis. This is moderate to marked without dilation of the thoracic aorta. Heart size is moderately markedly enlarged. Small pericardial effusion. Three-vessel coronary artery disease. Mitral annular and aortic valvular calcification. Mediastinum/Nodes: No signs of adenopathy in the chest. Mildly patulous esophagus. Lungs/Pleura: Diffuse interstitial thickening with irregular appearance. This is seen in the LEFT and RIGHT chest with basilar to apical gradient. No signs of bronchiectasis. No signs of lobar consolidative changes. Signs of scattered ground-glass intermixed with these areas. Airways are patent though slit like mainstem bronchi are noted bilaterally. No pneumothorax. No substantial pleural fluid. Upper Abdomen: Incidental imaging of upper abdominal contents show signs of fissural widening of hepatic fissures. Presence of ascites is noted. Small to moderate volume, incompletely assessed. Signs of end-stage renal disease. No upper abdominal lymphadenopathy aortic atherosclerosis of the abdominal aorta. Musculoskeletal: No chest wall mass or suspicious bone lesions identified. IMPRESSION: 1. Diffuse interstitial thickening with irregular appearance. This is seen in the LEFT and RIGHT chest with basilar to apical gradient. No signs of bronchiectasis. Findings are suspicious for underlying interstitial disease, difficult to exclude the possibility of superimposed pulmonary edema or multifocal infection. Continued attention on follow-up is suggested. 2. Moderate to marked cardiomegaly with valvular and coronary artery disease. 3. Findings of liver disease/potential cirrhosis and ascites. Ascites could also be due to  volume overload. Correlate clinically. 4. Signs of end-stage renal disease with atrophic bilateral kidneys incompletely evaluated. 5. Aortic atherosclerosis. Aortic Atherosclerosis (ICD10-I70.0). Electronically Signed   By: Zetta Bills M.D.   On: 09/13/2022 10:47     Medications:    anticoagulant sodium citrate     ceFEPime (MAXIPIME) IV 1 g (09/13/22 2014)   vancomycin 1,500 mg (09/14/22 1149)    Chlorhexidine Gluconate Cloth  6 each Topical Q0600   gabapentin  300 mg Oral Q M,W,F-1800   guaiFENesin  600 mg Oral BID   heparin  5,000 Units Subcutaneous Q8H   insulin aspart  0-5 Units Subcutaneous QHS   insulin aspart  0-6 Units Subcutaneous TID WC   sevelamer carbonate  1,600 mg Oral TID WC   acetaminophen **OR** acetaminophen, albuterol, alteplase, anticoagulant sodium citrate, heparin, hydrALAZINE, HYDROmorphone (DILAUDID) injection, lidocaine (PF), lidocaine-prilocaine, ondansetron **OR** ondansetron (ZOFRAN) IV, mouth rinse, pentafluoroprop-tetrafluoroeth, traMADol  Assessment/ Plan:  Mr. Jerry [name] is a 56 y.o.  male with past medical conditions including hypertension, chronic back pain, diabetes, and end-stage renal disease on hemodialysis.  Patient presents to the emergency department from his dialysis center with shortness of breath and weakness.  Patient has been admitted for Acute pulmonary edema (HCC) [J81.0] Pneumonia [J18.9] Acute respiratory failure with hypoxia (HCC) [J96.01] Fluid overload [E87.70]  [facility]/MWF/left aVF  End-stage renal disease on hemodialysis.  Receiving scheduled treatment today, UF goal 2.5 to  3 L as tolerated. Next treatment scheduled for Monday.  2. Anemia of chronic kidney disease Lab Results  Component Value Date   HGB 8.6 (L) 09/14/2022    Patient receives Mircera at outpatient clinic.  Hemoglobin remains below desired target.  Will continue to monitor.  3. Secondary Hyperparathyroidism: with outpatient  labs: PTH 818, phosphorus 9.1, calcium 8.9 on 08/27/22.   Lab Results  Component Value Date   CALCIUM 9.0 09/14/2022   PHOS 3.6 04/13/2021    Calcium within desired range.  Continue sevelamer with meals.  4.  Community-acquired pneumonia, suspected.  Chest x-ray suspicious for multifocal pneumonia.  Respiratory panel negative.  Currently receiving Zithromax and ceftriaxone per primary team. CT chest done yesterday shows interstitial disease, cannot exclude supra pole with pulmonary edema or multifocal infection.    LOS: [place] 12/1/202312:40 PM

## 2022-09-14 NOTE — Progress Notes (Signed)
PHARMACIST - PHYSICIAN COMMUNICATION   CONCERNING: Methylprednisolone IV    Current order: Methylprednisolone IV 40 mg IV every 12 hours   DESCRIPTION: Per [place] Protocol:   IV methylprednisolone will be converted to either a q12h or q24h frequency with the same total daily dose (TDD).  Ordered Dose: 1 to 125 mg TDD; convert to: TDD q24h.  Ordered Dose: 126 to 250 mg TDD; convert to: TDD div q12h.  Ordered Dose: >250 mg TDD; DAW.  Order has been adjusted to: Methylprednisolone IV 80 mg IV every 24 hours   Lorin Picket , PharmD Clinical Pharmacist  09/14/2022 3:12 PM

## 2022-09-14 NOTE — Progress Notes (Signed)
Pharmacy Antibiotic Note  Jerry [name] is a 56 y.o. male w/ h/o ESRD-HD & HTN  admitted on 09/11/2022 with pneumonia.  Pharmacy has been consulted for vancomycin dosing.  Plan: vancomycin 1500 mg IV x 1, then 1000 mg IV w/ HD (Jerry [name]/MWF) --goal vancomycin level (prior to 3rd HD session on vancomycin): 15 - 25 mcg/mL --MRSA PCR for potential narrowing of antibiotics  Height: 5\' 6"  (167.6 cm) Weight: 81.1 kg (178 lb 12.7 oz) IBW/kg (Calculated) : 63.8  Temp (24hrs), Avg:98.6 F (37 C), Min:98 F (36.7 C), Max:99.1 F (37.3 C)  Recent Labs  Lab 09/10/22 1550 09/11/22 0252 09/12/22 0607 09/14/22 0521  WBC 8.0 7.8 9.1 6.4  CREATININE 6.71* 8.35* 10.60* 9.41*    Estimated Creatinine Clearance: 8.7 mL/min (A) (by C-G formula based on SCr of 9.41 mg/dL (H)).    Allergies  Allergen Reactions   No Allergies On File     Antimicrobials this admission: 11/28 ceftriaxone >> 11/29 11/28 azithromycin >> 11/29 11/29 cefepime >>  12/01 vancomycin >>   Microbiology results: 11/28 BCx: NGTD (pending) 12/01 MRSA PCR: pending  Thank you for allowing pharmacy to be a part of this patient's care.  Dallie Piles 09/14/2022 9:50 AM

## 2022-09-14 NOTE — Progress Notes (Signed)
   09/14/22 1301  Vitals  Temp 98.1 F (36.7 C)  Temp Source Oral  BP (!) 152/69  MAP (mmHg) 93  BP Location Right Arm  BP Method Automatic  Patient Position (if appropriate) Lying  Pulse Rate 74  Pulse Rate Source Monitor  ECG Heart Rate 73  Resp 16  Oxygen Therapy  SpO2 100 %  O2 Device Nasal Cannula  O2 Flow Rate (L/min) 3 L/min  Patient Activity (if Appropriate) In bed  Pulse Oximetry Type Continuous  During Treatment Monitoring  Blood Flow Rate (mL/min) 200 mL/min  HD Safety Checks Performed Yes  Intra-Hemodialysis Comments Tx completed  Post Treatment  Dialyzer Clearance Lightly streaked  Duration of HD Treatment -hour(s) 3.5 hour(s)  Hemodialysis Intake (mL) 300 mL  Liters Processed 74  Fluid Removed (mL) 3000 mL  Tolerated HD Treatment Yes  Post-Hemodialysis Comments hd tx completed. no complications.  AVG/AVF Arterial Site Held (minutes) 10 minutes  AVG/AVF Venous Site Held (minutes) 10 minutes  Fistula / Graft Left Upper arm  No placement date or time found.   Placed prior to admission: Yes  Orientation: Left  Access Location: Upper arm  Site Condition No complications  Fistula / Graft Assessment Present;Thrill;Bruit  Status Deaccessed  Needle Size 15  Drainage Description None

## 2022-09-15 LAB — CBC WITH DIFFERENTIAL/PLATELET
Abs Immature Granulocytes: 0.02 10*3/uL (ref 0.00–0.07)
Basophils Absolute: 0 10*3/uL (ref 0.0–0.1)
Basophils Relative: 0 %
Eosinophils Absolute: 0 10*3/uL (ref 0.0–0.5)
Eosinophils Relative: 0 %
HCT: 26.1 % — ABNORMAL LOW (ref 39.0–52.0)
Hemoglobin: 9.1 g/dL — ABNORMAL LOW (ref 13.0–17.0)
Immature Granulocytes: 0 %
Lymphocytes Relative: 4 %
Lymphs Abs: 0.2 10*3/uL — ABNORMAL LOW (ref 0.7–4.0)
MCH: 33.6 pg (ref 26.0–34.0)
MCHC: 34.9 g/dL (ref 30.0–36.0)
MCV: 96.3 fL (ref 80.0–100.0)
Monocytes Absolute: 0.1 10*3/uL (ref 0.1–1.0)
Monocytes Relative: 3 %
Neutro Abs: 4.6 10*3/uL (ref 1.7–7.7)
Neutrophils Relative %: 93 %
Platelets: 155 10*3/uL (ref 150–400)
RBC: 2.71 MIL/uL — ABNORMAL LOW (ref 4.22–5.81)
RDW: 19.8 % — ABNORMAL HIGH (ref 11.5–15.5)
WBC: 4.9 10*3/uL (ref 4.0–10.5)
nRBC: 0 % (ref 0.0–0.2)

## 2022-09-15 LAB — RESPIRATORY PANEL BY PCR

## 2022-09-15 LAB — BASIC METABOLIC PANEL
Anion gap: 15 (ref 5–15)
BUN: 70 mg/dL — ABNORMAL HIGH (ref 6–20)
CO2: 23 mmol/L (ref 22–32)
Calcium: 9.2 mg/dL (ref 8.9–10.3)
Chloride: 91 mmol/L — ABNORMAL LOW (ref 98–111)
Creatinine, Ser: 7.13 mg/dL — ABNORMAL HIGH (ref 0.61–1.24)
GFR, Estimated: 8 mL/min — ABNORMAL LOW (ref >60)
Glucose, Bld: 300 mg/dL — ABNORMAL HIGH (ref 70–99)
Potassium: 4.4 mmol/L (ref 3.5–5.1)
Sodium: 129 mmol/L — ABNORMAL LOW (ref 135–145)

## 2022-09-15 LAB — GLUCOSE, CAPILLARY
Glucose-Capillary: 307 mg/dL — ABNORMAL HIGH (ref 70–99)
Glucose-Capillary: 229 mg/dL — ABNORMAL HIGH (ref 70–99)
Glucose-Capillary: 179 mg/dL — ABNORMAL HIGH (ref 70–99)
Glucose-Capillary: 209 mg/dL — ABNORMAL HIGH (ref 70–99)

## 2022-09-15 LAB — PROCALCITONIN: Procalcitonin: 9.11 ng/mL

## 2022-09-15 MED ORDER — GABAPENTIN 100 MG PO CAPS
100.0000 mg | ORAL_CAPSULE | Freq: Every day | ORAL | Status: DC
  Administered 2022-09-15 – 2022-09-22 (×7): 100 mg via ORAL
  Filled 2022-09-15 (×7): qty 1

## 2022-09-15 MED ORDER — INSULIN GLARGINE-YFGN 100 UNIT/ML ~~LOC~~ SOLN
5.0000 [IU] | Freq: Every day | SUBCUTANEOUS | Status: DC
  Administered 2022-09-15 – 2022-09-16 (×2): 5 [IU] via SUBCUTANEOUS
  Filled 2022-09-15 (×3): qty 0.05

## 2022-09-15 NOTE — Progress Notes (Signed)
[facility]  ROUNDING NOTE   Subjective:   Jerry [name] is a 56 year old Spanish-speaking male with past medical conditions including hypertension, chronic back pain, diabetes, and end-stage renal disease on hemodialysis.  Patient presents to the emergency department from his dialysis center with shortness of breath and weakness.  Patient has been admitted for Acute pulmonary edema (HCC) [J81.0] Pneumonia [J18.9] Acute respiratory failure with hypoxia ([place]) [J96.01] Fluid overload [E87.70]  Patient is known to our practice and receives outpatient dialysis treatments at [facility] on a MWF schedule, supervised by Dr. Candiss Norse.   Patient seen sitting at bedside, wife at bedside Visit conducted with video spanish interpretor Complains of upper back pain Shortness of breath persist with activity, 2L [place] No lower extremity edema   Objective:  Vital signs in last 24 hours:  Temp:  [98.1 F (36.7 C)-98.9 F (37.2 C)] 98.3 F (36.8 C) (12/02 0754) Pulse Rate:  [74-88] 83 (12/02 0754) Resp:  [14-18] 18 (12/02 0754) BP: (143-180)/(59-81) 180/81 (12/02 0754) SpO2:  [95 %-100 %] 97 % (12/02 0754) Weight:  [78.1 kg-79.2 kg] 79.2 kg (12/02 0300)  Weight change:  Filed Weights   09/14/22 0910 09/14/22 1313 09/15/22 0300  Weight: 81.1 kg 78.1 kg 79.2 kg    Intake/Output: I/O last 3 completed shifts: In: 257.4 [IV Piggyback:257.4] Out: 3000 [Other:3000]   Intake/Output this shift:  No intake/output data recorded.  Physical Exam: General: NAD ill-appearing  Head: Normocephalic, atraumatic. Moist oral mucosal membranes  Eyes: Anicteric  Lungs:  Rt Basilar crackles, normal effort, [place] O2  Heart: Regular rate and rhythm  Abdomen:  Soft, nontender, nondistended  Extremities: No peripheral edema.  Neurologic: Alert and oriented  Skin: No lesions  Access: Left aVF    Basic Metabolic Panel: Recent Labs  Lab 09/10/22 1550 09/11/22 0252  09/12/22 0607 09/14/22 0521 09/15/22 0729  NA 135 130* 132* 129* 129*  K 4.2 4.8 5.5* 4.8 4.4  CL 96* 95* 96* 91* 91*  CO2 21* 20* 17* 20* 23  GLUCOSE 126* 165* 125* 109* 300*  BUN 66* 83* 99* 91* 70*  CREATININE 6.71* 8.35* 10.60* 9.41* 7.13*  CALCIUM 9.4 9.0 9.2 9.0 9.2     Liver Function Tests: No results for input(s): "AST", "ALT", "ALKPHOS", "BILITOT", "PROT", "ALBUMIN" in the last 168 hours. No results for input(s): "LIPASE", "AMYLASE" in the last 168 hours. No results for input(s): "AMMONIA" in the last 168 hours.  CBC: Recent Labs  Lab 09/10/22 1550 09/11/22 0252 09/12/22 0607 09/14/22 0521 09/15/22 0729  WBC 8.0 7.8 9.1 6.4 4.9  NEUTROABS  --   --   --   --  4.6  HGB 10.3* 9.2* 8.9* 8.6* 9.1*  HCT 28.9* 26.5* 25.7* 24.8* 26.1*  MCV 96.0 97.1 98.8 96.9 96.3  PLT 200 185 168 147* 155     Cardiac Enzymes: No results for input(s): "CKTOTAL", "CKMB", "CKMBINDEX", "TROPONINI" in the last 168 hours.  BNP: Invalid input(s): "POCBNP"  CBG: Recent Labs  Lab 09/14/22 1412 09/14/22 1714 09/14/22 2049 09/15/22 0717 09/15/22 1208  GLUCAP 113* 168* 209* 307* 229*     Microbiology: Results for orders placed or performed during the hospital encounter of 09/11/22  Resp Panel by RT-PCR (Flu A&B, Covid) Anterior Nasal Swab     Status: None   Collection Time: 09/11/22 12:31 AM   Specimen: Anterior Nasal Swab  Result Value Ref Range Status   SARS Coronavirus 2 by RT PCR NEGATIVE NEGATIVE Final    Comment: (NOTE)  SARS-CoV-2 target nucleic acids are NOT DETECTED.  The SARS-CoV-2 RNA is generally detectable in upper respiratory specimens during the acute phase of infection. The lowest concentration of SARS-CoV-2 viral copies this assay can detect is 138 copies/mL. A negative result does not preclude SARS-Cov-2 infection and should not be used as the sole basis for treatment or other patient management decisions. A negative result may occur with  improper  specimen collection/handling, submission of specimen other than nasopharyngeal swab, presence of viral mutation(s) within the areas targeted by this assay, and inadequate number of viral copies(<138 copies/mL). A negative result must be combined with clinical observations, patient history, and epidemiological information. The expected result is Negative.  Fact Sheet for Patients:  EntrepreneurPulse.com.au  Fact Sheet for Healthcare Providers:  IncredibleEmployment.be  This test is no t yet approved or cleared by the [place] FDA and  has been authorized for detection and/or diagnosis of SARS-CoV-2 by FDA under an Emergency Use Authorization (EUA). This EUA will remain  in effect (meaning this test can be used) for the duration of the COVID-19 declaration under Section 564(b)(1) of the Act, 21 U.S.C.section 360bbb-3(b)(1), unless the authorization is terminated  or revoked sooner.       Influenza A by PCR NEGATIVE NEGATIVE Final   Influenza B by PCR NEGATIVE NEGATIVE Final    Comment: (NOTE) The Xpert Xpress SARS-CoV-2/FLU/RSV plus assay is intended as an aid in the diagnosis of influenza from Nasopharyngeal swab specimens and should not be used as a sole basis for treatment. Nasal washings and aspirates are unacceptable for Xpert Xpress SARS-CoV-2/FLU/RSV testing.  Fact Sheet for Patients: EntrepreneurPulse.com.au  Fact Sheet for Healthcare Providers: IncredibleEmployment.be  This test is not yet approved or cleared by the [place] FDA and has been authorized for detection and/or diagnosis of SARS-CoV-2 by FDA under an Emergency Use Authorization (EUA). This EUA will remain in effect (meaning this test can be used) for the duration of the COVID-19 declaration under Section 564(b)(1) of the Act, 21 U.S.C. section 360bbb-3(b)(1), unless the authorization is terminated or revoked.  Performed at  [facility], [address] [place], [place] [address]   Culture, blood (routine x 2) Call MD if unable to obtain prior to antibiotics being given     Status: None (Preliminary result)   Collection Time: 09/11/22  5:22 AM   Specimen: BLOOD  Result Value Ref Range Status   Specimen Description BLOOD RIGHT FA  Final   Special Requests   Final    BOTTLES DRAWN AEROBIC AND ANAEROBIC Blood Culture results may not be optimal due to an inadequate volume of blood received in culture bottles   Culture   Final    NO GROWTH 4 DAYS Performed at [facility], [address], [place], [place] [address]    Report Status PENDING  Incomplete  Culture, blood (routine x 2) Call MD if unable to obtain prior to antibiotics being given     Status: None (Preliminary result)   Collection Time: 09/12/22  6:07 AM   Specimen: BLOOD RIGHT HAND  Result Value Ref Range Status   Specimen Description BLOOD RIGHT HAND  Final   Special Requests   Final    BOTTLES DRAWN AEROBIC AND ANAEROBIC Blood Culture adequate volume   Culture   Final    NO GROWTH 3 DAYS Performed at [facility], [address], [place], [place] [address]    Report Status PENDING  Incomplete  Surgical pcr screen  Status: None   Collection Time: 09/14/22  4:30 PM   Specimen: Nasal Mucosa; Nasal Swab  Result Value Ref Range Status   MRSA, PCR NEGATIVE NEGATIVE Final   Staphylococcus aureus NEGATIVE NEGATIVE Final    Comment: (NOTE) The Xpert SA Assay (FDA approved for NASAL specimens in patients 70 years of age and older), is one component of a comprehensive surveillance program. It is not intended to diagnose infection nor to guide or monitor treatment. Performed at [facility], [address] [place], [place] [address]     Coagulation Studies: No results for input(s): "LABPROT", "INR" in the last 72 hours.  Urinalysis: No results for input(s): "COLORURINE", "LABSPEC", "PHURINE",  "GLUCOSEU", "HGBUR", "BILIRUBINUR", "KETONESUR", "PROTEINUR", "UROBILINOGEN", "NITRITE", "LEUKOCYTESUR" in the last 72 hours.  Invalid input(s): "APPERANCEUR"    Imaging: No results found.   Medications:    anticoagulant sodium citrate     ceFEPime (MAXIPIME) IV Stopped (09/14/22 2109)    arformoterol  15 mcg Nebulization BID   budesonide (PULMICORT) nebulizer solution  0.25 mg Nebulization BID   Chlorhexidine Gluconate Cloth  6 each Topical Q0600   gabapentin  100 mg Oral Q1200   guaiFENesin  600 mg Oral BID   heparin  5,000 Units Subcutaneous Q8H   insulin aspart  0-5 Units Subcutaneous QHS   insulin aspart  0-6 Units Subcutaneous TID WC   insulin glargine-yfgn  5 Units Subcutaneous Daily   ipratropium-albuterol  3 mL Nebulization Q6H   lidocaine  1 patch Transdermal Q24H   methocarbamol  500 mg Oral TID   methylPREDNISolone (SOLU-MEDROL) injection  80 mg Intravenous Q24H   sevelamer carbonate  1,600 mg Oral TID WC   acetaminophen **OR** acetaminophen, albuterol, alteplase, anticoagulant sodium citrate, heparin, hydrALAZINE, HYDROmorphone (DILAUDID) injection, lidocaine (PF), lidocaine-prilocaine, ondansetron **OR** ondansetron (ZOFRAN) IV, mouth rinse, pentafluoroprop-tetrafluoroeth, traMADol  Assessment/ Plan:  Mr. Drequan Ironside is a 56 y.o.  male with past medical conditions including hypertension, chronic back pain, diabetes, and end-stage renal disease on hemodialysis.  Patient presents to the emergency department from his dialysis center with shortness of breath and weakness.  Patient has been admitted for Acute pulmonary edema (HCC) [J81.0] Pneumonia [J18.9] Acute respiratory failure with hypoxia (HCC) [J96.01] Fluid overload [E87.70]  [facility]/MWF/left aVF  End-stage renal disease on hemodialysis.  Dialysis received yesterday, UF 3L as tolerated. Next treatment scheduled for Monday. Patient reminded to limit fluid intake and requested  mouth swabs for dry mouth.   2. Anemia of chronic kidney disease Lab Results  Component Value Date   HGB 9.1 (L) 09/15/2022    Patient receives Mircera at outpatient clinic.  Hemoglobin at target.  Will continue to monitor.  3. Secondary Hyperparathyroidism: with outpatient labs: PTH 818, phosphorus 9.1, calcium 8.9 on 08/27/22.   Lab Results  Component Value Date   CALCIUM 9.2 09/15/2022   PHOS 3.6 04/13/2021    Will continue to monitor bone minerals. Continue sevelamer with meals.  4.  Community-acquired pneumonia.  Chest x-ray suspicious for multifocal pneumonia.  Respiratory panel negative.  Currently receiving cefepime per primary team. CT chest shows interstitial disease, cannot exclude superimposed pulmonary edema or multifocal infection. Palliative care following   LOS: [place] 12/2/202312:39 PM

## 2022-09-15 NOTE — Progress Notes (Signed)
PROGRESS NOTE    Jerry [name]  JSE:[id number] DOB: August 15, 1966 DOA: 09/11/2022 PCP: Freddy Finner, NP    Brief Narrative:  56 year old Spanish-speaking male with past medical history of end-stage renal disease on hemodialysis and hypertension hospitalized 2 months ago for acute GI bleed from gastric ulcers requiring 3 units packed red blood cells who presented to the emergency room on 11/27 afternoon from dialysis with shortness of breath.  Patient had completed all previous sessions of dialysis.  Workup revealed mild hypoxia and underlying multifocal airway disease and elevated procalcitonin consistent with pneumonia as well as hypertensive urgency with systolic blood pressure of 170's.  Patient admitted to the hospitalist service.   By 11/29, patient with little improvement.  Follow-up procalcitonin that day had increased to 4.74 (previously 2.3) and Rocephin/Zithromax changed to cefepime.  Although only receiving 1 dose, procalcitonin up to 8.3 by following day and CT scan of chest ordered.   By following day, patient with only mild improvement and worsening procalcitonin and antibiotics adjusted.  12/1: CAT scan reviewed.  Patient with significant interstitial lung disease, severe cardiomegaly/fluid overload, radiographic findings of ESLD/cirrhosis.  Lengthy discussion between myself, patient's wife, patient's daughter, palliative care.  Explained that patient has evidence of multiorgan failure and overall prognosis remains poor.  Patient states that he would not be want to be kept on the ventilator long-term however did want chest compressions and endotracheal intubation in the setting cardiopulmonary arrest.  12/2: Hyperglycemia noted.  Likely secondary to steroids.     Assessment & Plan:   Principal Problem:   Acute dyspnea Active Problems:   CAP (community acquired pneumonia)   Elevated d-dimer, possible PE   High anion gap metabolic acidosis   Hypertensive emergency    ESRD on hemodialysis (HCC)   Diabetes mellitus (HCC)   Chronic back pain   Fluid overload   PUD (peptic ulcer disease)   Pneumonia   Multi lobar pneumonia causing acute respiratory failure with hypoxia Suspected interstitial lung disease Patient's CAT scan showed diffuse interstitial disease.  No obvious consolidations.  Procalcitonin continues to increase despite cefepime.  Patient remains on 3 L nasal cannula.  His main complaint is right-sided chest wall tenderness. Plan: Continue cefepime.  Discontinue vancomycin.  Continue Solu-Medrol 80 mg daily for now.  Start weaning course tomorrow.  Continue scheduled bronchodilators.  Aggressive pulmonary toileting.  Check RVP.  Check ANA/ANCA.    End-stage renal disease on hemodialysis Nephrology following.  HD done 12/1   Diabetes mellitus with hyperglycemia Sugars elevated in the setting of steroid administration.  Add Semglee 5 units daily.  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  Volume removal with dialysis.  Multiorgan failure Functional decline Patient with evidence of advanced heart failure, kidney failure, liver failure.  Also with findings of interstitial lung disease on chest imaging.  Prognosis guarded.  Family meeting with palliative care on 12/1.  Poor prognosis   DVT prophylaxis: SQ heparin Code Status: Full Family Communication: Wife and daughter at bedside 12/1, wife at bedside 12/2 Disposition Plan: Status is: Inpatient Remains inpatient appropriate because: Acute hypoxia.  Multifocal pneumonia on IV antibiotics.   Anticipated date of discharge  12/4.  Level of care: Med-Surg  Consultants:  Nephrology Palliative care  Procedures:  None  Antimicrobials: Cefepime   Subjective: Seen and examined.  Wife at bedside.  Patient concerned about hyperglycemia  Objective: Vitals:   09/14/22 1938 09/15/22 0231 09/15/22 0300 09/15/22 0754  BP: (!) 143/59 (!) 152/69  (!) 180/81  Pulse: 88 82  83  Resp: 17 17  18   Temp: 98.9 F (37.2 C) 98.3 F (36.8 C)  98.3 F (36.8 C)  TempSrc: Oral Oral  Oral  SpO2: 95% 100%  97%  Weight:   79.2 kg   Height:        Intake/Output Summary (Last 24 hours) at 09/15/2022 1232 Last data filed at 09/15/2022 0421 Gross per 24 hour  Intake 257.36 ml  Output 3000 ml  Net -2742.64 ml   Filed Weights   09/14/22 0910 09/14/22 1313 09/15/22 0300  Weight: 81.1 kg 78.1 kg 79.2 kg    Examination:  General exam: Appears fatigued.  Appears ill Respiratory system: Right base crackles.  Normal work of breathing.  3 L Cardiovascular system: S1-S2, RRR, 2/6 murmur, 2+ pedal edema Gastrointestinal system: Soft, NT/ND, normal bowel sounds Central nervous system: Alert and oriented. No focal neurological deficits. Extremities: Decreased power symmetrically Skin: No rashes, lesions or ulcers Psychiatry: Judgement and insight appear impaired. Mood & affect flattened.     Data Reviewed: I have personally reviewed following labs and imaging studies  CBC: Recent Labs  Lab 09/10/22 1550 09/11/22 0252 09/12/22 0607 09/14/22 0521 09/15/22 0729  WBC 8.0 7.8 9.1 6.4 4.9  NEUTROABS  --   --   --   --  4.6  HGB 10.3* 9.2* 8.9* 8.6* 9.1*  HCT 28.9* 26.5* 25.7* 24.8* 26.1*  MCV 96.0 97.1 98.8 96.9 96.3  PLT 200 185 168 147* 295   Basic Metabolic Panel: Recent Labs  Lab 09/10/22 1550 09/11/22 0252 09/12/22 0607 09/14/22 0521 09/15/22 0729  NA 135 130* 132* 129* 129*  K 4.2 4.8 5.5* 4.8 4.4  CL 96* 95* 96* 91* 91*  CO2 21* 20* 17* 20* 23  GLUCOSE 126* 165* 125*  109* 300*  BUN 66* 83* 99* 91* 70*  CREATININE 6.71* 8.35* 10.60* 9.41* 7.13*  CALCIUM 9.4 9.0 9.2 9.0 9.2   GFR: Estimated Creatinine Clearance: 11.3 mL/min (A) (by C-G formula based on SCr of 7.13 mg/dL (H)). Liver Function Tests: No results for input(s): "AST", "ALT", "ALKPHOS", "BILITOT", "PROT", "ALBUMIN" in the last 168 hours. No results for input(s): "LIPASE", "AMYLASE" in the last 168 hours. No results for input(s): "AMMONIA" in the last 168 hours. Coagulation Profile: No results for input(s): "INR", "PROTIME" in the last 168 hours. Cardiac Enzymes: No results for input(s): "CKTOTAL", "CKMB", "CKMBINDEX", "TROPONINI" in the last 168 hours. BNP (last 3 results) No results for input(s): "PROBNP" in the last 8760 hours. HbA1C: No results for input(s): "HGBA1C" in the last 72 hours. CBG: Recent Labs  Lab 09/14/22 1412 09/14/22 1714 09/14/22 2049 09/15/22 0717 09/15/22 1208  GLUCAP 113* 168* 209* 307* 229*   Lipid Profile: No results for input(s): "CHOL", "HDL", "LDLCALC", "TRIG", "CHOLHDL", "LDLDIRECT" in the last 72 hours. Thyroid Function Tests: No results for input(s): "TSH", "T4TOTAL", "FREET4", "T3FREE", "THYROIDAB" in the last 72 hours. Anemia Panel: No results for input(s): "VITAMINB12", "FOLATE", "FERRITIN", "TIBC", "IRON", "RETICCTPCT" in the last 72 hours. Sepsis Labs: Recent Labs  Lab 09/12/22 0950 09/13/22 0511 09/14/22 0521 09/15/22 0729  PROCALCITON 4.74 8.30 9.57 9.11    Recent Results (  from the past 240 hour(s))  Resp Panel by RT-PCR (Flu A&B, Covid) Anterior Nasal Swab     Status: None   Collection Time: 09/11/22 12:31 AM   Specimen: Anterior Nasal Swab  Result Value Ref Range Status   SARS Coronavirus 2 by RT PCR NEGATIVE NEGATIVE Final    Comment: (NOTE) SARS-CoV-2 target nucleic acids are NOT DETECTED.  The SARS-CoV-2 RNA is generally detectable in upper respiratory specimens during the acute phase of infection. The lowest concentration  of SARS-CoV-2 viral copies this assay can detect is 138 copies/mL. A negative result does not preclude SARS-Cov-2 infection and should not be used as the sole basis for treatment or other patient management decisions. A negative result may occur with  improper specimen collection/handling, submission of specimen other than nasopharyngeal swab, presence of viral mutation(s) within the areas targeted by this assay, and inadequate number of viral copies(<138 copies/mL). A negative result must be combined with clinical observations, patient history, and epidemiological information. The expected result is Negative.  Fact Sheet for Patients:  EntrepreneurPulse.com.au  Fact Sheet for Healthcare Providers:  IncredibleEmployment.be  This test is no t yet approved or cleared by the [place] FDA and  has been authorized for detection and/or diagnosis of SARS-CoV-2 by FDA under an Emergency Use Authorization (EUA). This EUA will remain  in effect (meaning this test can be used) for the duration of the COVID-19 declaration under Section 564(b)(1) of the Act, 21 U.S.C.section 360bbb-3(b)(1), unless the authorization is terminated  or revoked sooner.       Influenza A by PCR NEGATIVE NEGATIVE Final   Influenza B by PCR NEGATIVE NEGATIVE Final    Comment: (NOTE) The Xpert Xpress SARS-CoV-2/FLU/RSV plus assay is intended as an aid in the diagnosis of influenza from Nasopharyngeal swab specimens and should not be used as a sole basis for treatment. Nasal washings and aspirates are unacceptable for Xpert Xpress SARS-CoV-2/FLU/RSV testing.  Fact Sheet for Patients: EntrepreneurPulse.com.au  Fact Sheet for Healthcare Providers: IncredibleEmployment.be  This test is not yet approved or cleared by the [place] FDA and has been authorized for detection and/or diagnosis of SARS-CoV-2 by FDA under an Emergency Use  Authorization (EUA). This EUA will remain in effect (meaning this test can be used) for the duration of the COVID-19 declaration under Section 564(b)(1) of the Act, 21 U.S.C. section 360bbb-3(b)(1), unless the authorization is terminated or revoked.  Performed at [facility], [address] [place], [place] [address]   Culture, blood (routine x 2) Call MD if unable to obtain prior to antibiotics being given     Status: None (Preliminary result)   Collection Time: 09/11/22  5:22 AM   Specimen: BLOOD  Result Value Ref Range Status   Specimen Description BLOOD RIGHT FA  Final   Special Requests   Final    BOTTLES DRAWN AEROBIC AND ANAEROBIC Blood Culture results may not be optimal due to an inadequate volume of blood received in culture bottles   Culture   Final    NO GROWTH 4 DAYS Performed at [facility], [address] [place], [place] [address]    Report Status PENDING  Incomplete  Culture, blood (routine x 2) Call MD if unable to obtain prior to antibiotics being given     Status: None (Preliminary result)   Collection Time: 09/12/22  6:07 AM   Specimen: BLOOD RIGHT HAND  Result Value Ref Range Status   Specimen Description BLOOD RIGHT HAND  Final  Special Requests   Final    BOTTLES DRAWN AEROBIC AND ANAEROBIC Blood Culture adequate volume   Culture   Final    NO GROWTH 3 DAYS Performed at [facility], [address] [place], [place] [address]    Report Status PENDING  Incomplete  Surgical pcr screen     Status: None   Collection Time: 09/14/22  4:30 PM   Specimen: Nasal Mucosa; Nasal Swab  Result Value Ref Range Status   MRSA, PCR NEGATIVE NEGATIVE Final   Staphylococcus aureus NEGATIVE NEGATIVE Final    Comment: (NOTE) The Xpert SA Assay (FDA approved for NASAL specimens in patients 63 years of age and older), is one component of a comprehensive surveillance program. It is not intended to diagnose infection nor to guide or monitor  treatment. Performed at [facility], [address], [place], [place] [address]          Radiology Studies: No results found.      Scheduled Meds:  arformoterol  15 mcg Nebulization BID   budesonide (PULMICORT) nebulizer solution  0.25 mg Nebulization BID   Chlorhexidine Gluconate Cloth  6 each Topical Q0600   gabapentin  100 mg Oral Q1200   guaiFENesin  600 mg Oral BID   heparin  5,000 Units Subcutaneous Q8H   insulin aspart  0-5 Units Subcutaneous QHS   insulin aspart  0-6 Units Subcutaneous TID WC   insulin glargine-yfgn  5 Units Subcutaneous Daily   ipratropium-albuterol  3 mL Nebulization Q6H   lidocaine  1 patch Transdermal Q24H   methocarbamol  500 mg Oral TID   methylPREDNISolone (SOLU-MEDROL) injection  80 mg Intravenous Q24H   sevelamer carbonate  1,600 mg Oral TID WC   Continuous Infusions:  anticoagulant sodium citrate     ceFEPime (MAXIPIME) IV Stopped (09/14/22 2109)     LOS: 4 days      Sidney Ace, MD [facility]   If 7PM-7AM, please contact night-coverage  09/15/2022, 12:32 PM

## 2022-09-16 LAB — BASIC METABOLIC PANEL
Anion gap: 18 — ABNORMAL HIGH (ref 5–15)
BUN: 92 mg/dL — ABNORMAL HIGH (ref 6–20)
CO2: 21 mmol/L — ABNORMAL LOW (ref 22–32)
Calcium: 9.6 mg/dL (ref 8.9–10.3)
Chloride: 88 mmol/L — ABNORMAL LOW (ref 98–111)
Creatinine, Ser: 9.03 mg/dL — ABNORMAL HIGH (ref 0.61–1.24)
GFR, Estimated: 6 mL/min — ABNORMAL LOW (ref >60)
Glucose, Bld: 284 mg/dL — ABNORMAL HIGH (ref 70–99)
Potassium: 5 mmol/L (ref 3.5–5.1)
Sodium: 127 mmol/L — ABNORMAL LOW (ref 135–145)

## 2022-09-16 LAB — CBC WITH DIFFERENTIAL/PLATELET
Abs Immature Granulocytes: 0.03 10*3/uL (ref 0.00–0.07)
Basophils Absolute: 0 10*3/uL (ref 0.0–0.1)
Basophils Relative: 0 %
Eosinophils Absolute: 0 10*3/uL (ref 0.0–0.5)
Eosinophils Relative: 0 %
HCT: 25.9 % — ABNORMAL LOW (ref 39.0–52.0)
Hemoglobin: 9.1 g/dL — ABNORMAL LOW (ref 13.0–17.0)
Immature Granulocytes: 0 %
Lymphocytes Relative: 1 %
Lymphs Abs: 0.1 10*3/uL — ABNORMAL LOW (ref 0.7–4.0)
MCH: 33.3 pg (ref 26.0–34.0)
MCHC: 35.1 g/dL (ref 30.0–36.0)
MCV: 94.9 fL (ref 80.0–100.0)
Monocytes Absolute: 0.3 10*3/uL (ref 0.1–1.0)
Monocytes Relative: 4 %
Neutro Abs: 7.6 10*3/uL (ref 1.7–7.7)
Neutrophils Relative %: 95 %
Platelets: 136 10*3/uL — ABNORMAL LOW (ref 150–400)
RBC: 2.73 MIL/uL — ABNORMAL LOW (ref 4.22–5.81)
RDW: 20.1 % — ABNORMAL HIGH (ref 11.5–15.5)
WBC: 8.1 10*3/uL (ref 4.0–10.5)
nRBC: 0 % (ref 0.0–0.2)

## 2022-09-16 LAB — CULTURE, BLOOD (ROUTINE X 2): Culture: NO GROWTH

## 2022-09-16 LAB — PROCALCITONIN: Procalcitonin: 8.11 ng/mL

## 2022-09-16 LAB — GLUCOSE, CAPILLARY
Glucose-Capillary: 272 mg/dL — ABNORMAL HIGH (ref 70–99)
Glucose-Capillary: 275 mg/dL — ABNORMAL HIGH (ref 70–99)
Glucose-Capillary: 229 mg/dL — ABNORMAL HIGH (ref 70–99)
Glucose-Capillary: 221 mg/dL — ABNORMAL HIGH (ref 70–99)

## 2022-09-16 MED ORDER — METHYLPREDNISOLONE SODIUM SUCC 40 MG IJ SOLR
40.0000 mg | INTRAMUSCULAR | Status: DC
  Administered 2022-09-16: 40 mg via INTRAVENOUS
  Filled 2022-09-16: qty 1

## 2022-09-16 MED ORDER — OXYCODONE HCL 5 MG PO TABS
5.0000 mg | ORAL_TABLET | ORAL | Status: DC | PRN
  Administered 2022-09-16: 10 mg via ORAL
  Administered 2022-09-16: 5 mg via ORAL
  Administered 2022-09-18 – 2022-09-22 (×8): 10 mg via ORAL
  Filled 2022-09-16 (×9): qty 2
  Filled 2022-09-16: qty 1
  Filled 2022-09-16: qty 2

## 2022-09-16 MED ORDER — METHOCARBAMOL 500 MG PO TABS
750.0000 mg | ORAL_TABLET | Freq: Four times a day (QID) | ORAL | Status: DC
  Administered 2022-09-16 – 2022-09-23 (×23): 750 mg via ORAL
  Filled 2022-09-16 (×24): qty 2

## 2022-09-16 MED ORDER — CALCIUM CARBONATE ANTACID 500 MG PO CHEW
1.0000 | CHEWABLE_TABLET | Freq: Three times a day (TID) | ORAL | Status: DC
  Administered 2022-09-16 – 2022-09-20 (×13): 200 mg via ORAL
  Filled 2022-09-16 (×13): qty 1

## 2022-09-16 NOTE — Progress Notes (Signed)
PROGRESS NOTE    Jerry [name]  ZOX:[id number] DOB: 09-14-1966 DOA: 09/11/2022 PCP: Freddy Finner, NP    Brief Narrative:  56 year old Spanish-speaking male with past medical history of end-stage renal disease on hemodialysis and hypertension hospitalized 2 months ago for acute GI bleed from gastric ulcers requiring 3 units packed red blood cells who presented to the emergency room on 11/27 afternoon from dialysis with shortness of breath.  Patient had completed all previous sessions of dialysis.  Workup revealed mild hypoxia and underlying multifocal airway disease and elevated procalcitonin consistent with pneumonia as well as hypertensive urgency with systolic blood pressure of 170's.  Patient admitted to the hospitalist service.   By 11/29, patient with little improvement.  Follow-up procalcitonin that day had increased to 4.74 (previously 2.3) and Rocephin/Zithromax changed to cefepime.  Although only receiving 1 dose, procalcitonin up to 8.3 by following day and CT scan of chest ordered.   By following day, patient with only mild improvement and worsening procalcitonin and antibiotics adjusted.  12/1: CAT scan reviewed.  Patient with significant interstitial lung disease, severe cardiomegaly/fluid overload, radiographic findings of ESLD/cirrhosis.  Lengthy discussion between myself, patient's wife, patient's daughter, palliative care.  Explained that patient has evidence of multiorgan failure and overall prognosis remains poor.  Patient states that he would not be want to be kept on the ventilator long-term however did want chest compressions and endotracheal intubation in the setting cardiopulmonary arrest.  12/2: Hyperglycemia noted.  Likely secondary to steroids.   12/3: No appreciable status changes.  Patient continues to complain of upper back pain.   Assessment & Plan:   Principal Problem:   Acute dyspnea Active Problems:   CAP (community acquired pneumonia)    Elevated d-dimer, possible PE   High anion gap metabolic acidosis   Hypertensive emergency   ESRD on hemodialysis (HCC)   Diabetes mellitus (HCC)   Chronic back pain   Fluid overload   PUD (peptic ulcer disease)   Pneumonia   Multi lobar pneumonia causing acute respiratory failure with hypoxia Suspected interstitial lung disease Patient's CAT scan showed diffuse interstitial disease.  No obvious consolidations.  Procalcitonin continues to increase despite cefepime.  Patient remains on 3 L nasal cannula.  His main complaint is right-sided chest wall tenderness.  He also complains of upper back pain Procalcitonin downtrending Respiratory viral panel negative Plan: Continue cefepime Wean steroids Solu-Medrol 40 mg daily Bronchodilators Follow-up ANA/ANCA/complement Aggressive pulmonary toileting Wean oxygen as tolerated  End-stage renal disease on hemodialysis Nephrology following.  HD done 12/1   Diabetes mellitus with hyperglycemia Sugars elevated in the setting of steroid administration.   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  Volume removal with dialysis.  Multiorgan failure Functional decline Patient with evidence of advanced heart failure, kidney failure, liver failure.  Also with findings of interstitial lung disease on chest imaging.  Prognosis guarded.  Family meeting with palliative care on 12/1.  Poor prognosis   DVT prophylaxis: SQ heparin Code Status: Full Family Communication: Wife and  daughter at bedside 12/1, wife at bedside 12/2, 12/3 Disposition  Plan: Status is: Inpatient Remains inpatient appropriate because: Acute hypoxia.  Multifocal pneumonia on IV antibiotics.  Anticipated date of discharge 12/4.  Level of care: Med-Surg  Consultants:  Nephrology Palliative care  Procedures:  None  Antimicrobials: Cefepime   Subjective: Seen and examined.  Wife at bedside.  Patient continues to endorse upper back pain and poor sleep  Objective: Vitals:   09/15/22 1950 09/16/22 0143 09/16/22 0427 09/16/22 0902  BP:   (!) 162/80 (!) 158/73  Pulse:   87 95  Resp:   16 19  Temp:   97.8 F (36.6 C) 98.5 F (36.9 C)  TempSrc:   Oral Oral  SpO2: 98% 93% 100% 96%  Weight:      Height:        Intake/Output Summary (Last 24 hours) at 09/16/2022 1041 Last data filed at 09/16/2022 0451 Gross per 24 hour  Intake --  Output 0 ml  Net 0 ml   Filed Weights   09/14/22 0910 09/14/22 1313 09/15/22 0300  Weight: 81.1 kg 78.1 kg 79.2 kg    Examination:  General exam: Fatigue.  Ill-appearing Respiratory system: Bibasilar crackles.  Normal work of breathing.  3 L Cardiovascular system: S1-S2, RRR, 2/6 murmur, 2+ pedal edema Gastrointestinal system: Soft, NT/ND, normal bowel sounds Central nervous system: Alert and oriented. No focal neurological deficits. Extremities: Decreased power symmetrically Skin: No rashes, lesions or ulcers Psychiatry: Judgement and insight appear impaired. Mood & affect flattened.     Data Reviewed: I have personally reviewed following labs and imaging studies  CBC: Recent Labs  Lab 09/11/22 0252 09/12/22 0607 09/14/22 0521 09/15/22 0729 09/16/22 0751  WBC 7.8 9.1 6.4 4.9 8.1  NEUTROABS  --   --   --  4.6 7.6  HGB 9.2* 8.9* 8.6* 9.1* 9.1*  HCT 26.5* 25.7* 24.8* 26.1* 25.9*  MCV 97.1 98.8 96.9 96.3 94.9  PLT 185 168 147* 155 650*   Basic Metabolic Panel: Recent Labs  Lab 09/11/22 0252 09/12/22 0607 09/14/22 0521  09/15/22 0729 09/16/22 0751  NA 130* 132* 129* 129* 127*  K 4.8 5.5* 4.8 4.4 5.0  CL 95* 96* 91* 91* 88*  CO2 20* 17* 20* 23 21*  GLUCOSE 165* 125* 109* 300* 284*  BUN 83* 99* 91* 70* 92*  CREATININE 8.35* 10.60* 9.41* 7.13* 9.03*  CALCIUM 9.0 9.2 9.0 9.2 9.6   GFR: Estimated Creatinine Clearance: 8.9 mL/min (A) (by C-G formula based on SCr of 9.03 mg/dL (H)). Liver Function Tests: No results for input(s): "AST", "ALT", "ALKPHOS", "BILITOT", "PROT", "ALBUMIN" in the last 168 hours. No results for input(s): "LIPASE", "AMYLASE" in the last 168 hours. No results for input(s): "AMMONIA" in the last 168 hours. Coagulation Profile: No results for input(s): "INR", "PROTIME" in the last 168 hours. Cardiac Enzymes: No results for input(s): "CKTOTAL", "CKMB", "CKMBINDEX", "TROPONINI" in the last 168 hours. BNP (last 3 results) No results for input(s): "PROBNP" in the last 8760 hours. HbA1C: No results for input(s): "HGBA1C" in the last 72 hours. CBG: Recent Labs  Lab 09/15/22 0717 09/15/22 1208 09/15/22 1649 09/15/22 2124 09/16/22 0905  GLUCAP 307* 229* 179* 209* 272*   Lipid Profile: No results for input(s): "CHOL", "HDL", "LDLCALC", "TRIG", "CHOLHDL", "LDLDIRECT" in the last 72 hours. Thyroid Function Tests: No results for input(s): "TSH", "T4TOTAL", "FREET4", "T3FREE", "THYROIDAB" in the last 72 hours. Anemia Panel: No results for input(s): "VITAMINB12", "FOLATE", "FERRITIN", "TIBC", "IRON", "RETICCTPCT" in the last 72 hours. Sepsis Labs: Recent Labs  Lab 09/13/22 0511 09/14/22  5784 09/15/22 0729 09/16/22 0751  PROCALCITON 8.30 9.57 9.11 8.11    Recent Results (from the past 240 hour(s))  Resp Panel by RT-PCR (Flu A&B, Covid) Anterior Nasal Swab     Status: None   Collection Time: 09/11/22 12:31 AM   Specimen: Anterior Nasal Swab  Result Value Ref Range Status   SARS Coronavirus 2 by RT PCR NEGATIVE NEGATIVE Final    Comment: (NOTE) SARS-CoV-2 target nucleic acids  are NOT DETECTED.  The SARS-CoV-2 RNA is generally detectable in upper respiratory specimens during the acute phase of infection. The lowest concentration of SARS-CoV-2 viral copies this assay can detect is 138 copies/mL. A negative result does not preclude SARS-Cov-2 infection and should not be used as the sole basis for treatment or other patient management decisions. A negative result may occur with  improper specimen collection/handling, submission of specimen other than nasopharyngeal swab, presence of viral mutation(s) within the areas targeted by this assay, and inadequate number of viral copies(<138 copies/mL). A negative result must be combined with clinical observations, patient history, and epidemiological information. The expected result is Negative.  Fact Sheet for Patients:  EntrepreneurPulse.com.au  Fact Sheet for Healthcare Providers:  IncredibleEmployment.be  This test is no t yet approved or cleared by the [place] FDA and  has been authorized for detection and/or diagnosis of SARS-CoV-2 by FDA under an Emergency Use Authorization (EUA). This EUA will remain  in effect (meaning this test can be used) for the duration of the COVID-19 declaration under Section 564(b)(1) of the Act, 21 U.S.C.section 360bbb-3(b)(1), unless the authorization is terminated  or revoked sooner.       Influenza A by PCR NEGATIVE NEGATIVE Final   Influenza B by PCR NEGATIVE NEGATIVE Final    Comment: (NOTE) The Xpert Xpress SARS-CoV-2/FLU/RSV plus assay is intended as an aid in the diagnosis of influenza from Nasopharyngeal swab specimens and should not be used as a sole basis for treatment. Nasal washings and aspirates are unacceptable for Xpert Xpress SARS-CoV-2/FLU/RSV testing.  Fact Sheet for Patients: EntrepreneurPulse.com.au  Fact Sheet for Healthcare Providers: IncredibleEmployment.be  This test is  not yet approved or cleared by the [place] FDA and has been authorized for detection and/or diagnosis of SARS-CoV-2 by FDA under an Emergency Use Authorization (EUA). This EUA will remain in effect (meaning this test can be used) for the duration of the COVID-19 declaration under Section 564(b)(1) of the Act, 21 U.S.C. section 360bbb-3(b)(1), unless the authorization is terminated or revoked.  Performed at [facility], [address] [place], [place] [address]   Culture, blood (routine x 2) Call MD if unable to obtain prior to antibiotics being given     Status: None   Collection Time: 09/11/22  5:22 AM   Specimen: BLOOD  Result Value Ref Range Status   Specimen Description BLOOD RIGHT FA  Final   Special Requests   Final    BOTTLES DRAWN AEROBIC AND ANAEROBIC Blood Culture results may not be optimal due to an inadequate volume of blood received in culture bottles   Culture   Final    NO GROWTH 5 DAYS Performed at [facility], [address] [place], [place] [address]    Report Status 09/16/2022 FINAL  Final  Culture, blood (routine x 2) Call MD if unable to obtain prior to antibiotics being given     Status: None (Preliminary result)   Collection Time: 09/12/22  6:07 AM   Specimen: BLOOD RIGHT HAND  Result  Value Ref Range Status   Specimen Description BLOOD RIGHT HAND  Final   Special Requests   Final    BOTTLES DRAWN AEROBIC AND ANAEROBIC Blood Culture adequate volume   Culture   Final    NO GROWTH 4 DAYS Performed at [facility], [address] [place], [place] [address]    Report Status PENDING  Incomplete  Surgical pcr screen     Status: None   Collection Time: 09/14/22  4:30 PM   Specimen: Nasal Mucosa; Nasal Swab  Result Value Ref Range Status   MRSA, PCR NEGATIVE NEGATIVE Final   Staphylococcus aureus NEGATIVE NEGATIVE Final    Comment: (NOTE) The Xpert SA Assay (FDA approved for NASAL specimens in patients 52 years of age  and older), is one component of a comprehensive surveillance program. It is not intended to diagnose infection nor to guide or monitor treatment. Performed at [facility], [place], [place] [address]   Respiratory (~20 pathogens) panel by PCR     Status: None   Collection Time: 09/15/22 10:12 AM   Specimen: Nasopharyngeal Swab; Respiratory  Result Value Ref Range Status   Adenovirus NOT DETECTED NOT DETECTED Final   Coronavirus 229E NOT DETECTED NOT DETECTED Final    Comment: (NOTE) The Coronavirus on the Respiratory Panel, DOES NOT test for the novel  Coronavirus (2019 nCoV)    Coronavirus HKU1 NOT DETECTED NOT DETECTED Final   Coronavirus NL63 NOT DETECTED NOT DETECTED Final   Coronavirus OC43 NOT DETECTED NOT DETECTED Final   Metapneumovirus NOT DETECTED NOT DETECTED Final   Rhinovirus / Enterovirus NOT DETECTED NOT DETECTED Final   Influenza A NOT DETECTED NOT DETECTED Final   Influenza B NOT DETECTED NOT DETECTED Final   Parainfluenza Virus 1 NOT DETECTED NOT DETECTED Final   Parainfluenza Virus 2 NOT DETECTED NOT DETECTED Final   Parainfluenza Virus 3 NOT DETECTED NOT DETECTED Final   Parainfluenza Virus 4 NOT DETECTED NOT DETECTED Final   Respiratory Syncytial Virus NOT DETECTED NOT DETECTED Final   Bordetella pertussis NOT DETECTED NOT DETECTED Final   Bordetella Parapertussis NOT DETECTED NOT DETECTED Final   Chlamydophila pneumoniae NOT DETECTED NOT DETECTED Final   Mycoplasma pneumoniae NOT DETECTED NOT DETECTED Final    Comment: Performed at [facility], [place]. [address], [place], [place] [address]         Radiology Studies: No results found.      Scheduled Meds:  arformoterol  15 mcg Nebulization BID   budesonide (PULMICORT) nebulizer solution  0.25 mg Nebulization BID   calcium carbonate  1 tablet Oral TID WC   Chlorhexidine Gluconate Cloth  6 each Topical Q0600   gabapentin  100 mg Oral Q1200   guaiFENesin  600 mg Oral  BID   heparin  5,000 Units Subcutaneous Q8H   insulin aspart  0-5 Units Subcutaneous QHS   insulin aspart  0-6 Units Subcutaneous TID WC   insulin glargine-yfgn  5 Units Subcutaneous Daily   ipratropium-albuterol  3 mL Nebulization Q6H   lidocaine  1 patch Transdermal Q24H   methocarbamol  750 mg Oral QID   methylPREDNISolone (SOLU-MEDROL) injection  40 mg Intravenous Q24H   sevelamer carbonate  1,600 mg Oral TID WC   Continuous Infusions:  anticoagulant sodium citrate     ceFEPime (MAXIPIME) IV 1 g (09/15/22 2157)     LOS: 5 days      Sidney Ace, MD [facility]   If 7PM-7AM,  please contact night-coverage  09/16/2022, 10:41 AM

## 2022-09-17 ENCOUNTER — Ambulatory Visit: Payer: Self-pay | Admitting: Gastroenterology

## 2022-09-17 DIAGNOSIS — G8929 Other chronic pain: Secondary | ICD-10-CM

## 2022-09-17 DIAGNOSIS — R531 Weakness: Secondary | ICD-10-CM

## 2022-09-17 DIAGNOSIS — M549 Dorsalgia, unspecified: Secondary | ICD-10-CM

## 2022-09-17 LAB — CBC
HCT: 24 % — ABNORMAL LOW (ref 39.0–52.0)
Hemoglobin: 8.5 g/dL — ABNORMAL LOW (ref 13.0–17.0)
MCH: 32.9 pg (ref 26.0–34.0)
MCHC: 35.4 g/dL (ref 30.0–36.0)
MCV: 93 fL (ref 80.0–100.0)
Platelets: 138 10*3/uL — ABNORMAL LOW (ref 150–400)
RBC: 2.58 MIL/uL — ABNORMAL LOW (ref 4.22–5.81)
RDW: 19.9 % — ABNORMAL HIGH (ref 11.5–15.5)
WBC: 8.5 10*3/uL (ref 4.0–10.5)
nRBC: 0 % (ref 0.0–0.2)

## 2022-09-17 LAB — RENAL FUNCTION PANEL
Albumin: 2.7 g/dL — ABNORMAL LOW (ref 3.5–5.0)
Anion gap: 17 — ABNORMAL HIGH (ref 5–15)
BUN: 126 mg/dL — ABNORMAL HIGH (ref 6–20)
CO2: 23 mmol/L (ref 22–32)
Calcium: 9 mg/dL (ref 8.9–10.3)
Chloride: 86 mmol/L — ABNORMAL LOW (ref 98–111)
Creatinine, Ser: 10.1 mg/dL — ABNORMAL HIGH (ref 0.61–1.24)
GFR, Estimated: 5 mL/min — ABNORMAL LOW (ref >60)
Glucose, Bld: 184 mg/dL — ABNORMAL HIGH (ref 70–99)
Phosphorus: 8.8 mg/dL — ABNORMAL HIGH (ref 2.5–4.6)
Potassium: 4.8 mmol/L (ref 3.5–5.1)
Sodium: 126 mmol/L — ABNORMAL LOW (ref 135–145)

## 2022-09-17 LAB — GLUCOSE, CAPILLARY
Glucose-Capillary: 323 mg/dL — ABNORMAL HIGH (ref 70–99)
Glucose-Capillary: 249 mg/dL — ABNORMAL HIGH (ref 70–99)
Glucose-Capillary: 272 mg/dL — ABNORMAL HIGH (ref 70–99)
Glucose-Capillary: 189 mg/dL — ABNORMAL HIGH (ref 70–99)

## 2022-09-17 LAB — CULTURE, BLOOD (ROUTINE X 2)
Culture: NO GROWTH
Special Requests: ADEQUATE

## 2022-09-17 LAB — SEDIMENTATION RATE: Sed Rate: 41 mm/hr — ABNORMAL HIGH (ref 0–20)

## 2022-09-17 MED ORDER — METOPROLOL TARTRATE 5 MG/5ML IV SOLN
5.0000 mg | Freq: Once | INTRAVENOUS | Status: AC
  Administered 2022-09-17: 5 mg via INTRAVENOUS
  Filled 2022-09-17: qty 5

## 2022-09-17 MED ORDER — INSULIN GLARGINE-YFGN 100 UNIT/ML ~~LOC~~ SOLN
10.0000 [IU] | Freq: Every day | SUBCUTANEOUS | Status: DC
  Administered 2022-09-17 – 2022-09-18 (×2): 10 [IU] via SUBCUTANEOUS
  Filled 2022-09-17 (×2): qty 0.1

## 2022-09-17 MED ORDER — INSULIN GLARGINE-YFGN 100 UNIT/ML ~~LOC~~ SOLN: 10.0000 [IU] | Freq: Every day | SUBCUTANEOUS | Status: DC

## 2022-09-17 MED ORDER — PREDNISONE 20 MG PO TABS
40.0000 mg | ORAL_TABLET | Freq: Every day | ORAL | Status: DC
  Administered 2022-09-17: 40 mg via ORAL
  Filled 2022-09-17: qty 2

## 2022-09-17 MED ORDER — PREDNISONE 20 MG PO TABS
40.0000 mg | ORAL_TABLET | Freq: Every day | ORAL | Status: DC
  Administered 2022-09-18 – 2022-09-20 (×3): 40 mg via ORAL
  Filled 2022-09-17 (×3): qty 2

## 2022-09-17 NOTE — Progress Notes (Signed)
[facility]  ROUNDING NOTE   Subjective:   Jerry [name] is a 56 year old Spanish-speaking male with past medical conditions including hypertension, chronic back pain, diabetes, and end-stage renal disease on hemodialysis.  Patient presents to the emergency department from his dialysis center with shortness of breath and weakness.  Patient has been admitted for Acute pulmonary edema (HCC) [J81.0] Pneumonia [J18.9] Acute respiratory failure with hypoxia ([place]) [J96.01] Fluid overload [E87.70]  Patient is known to our practice and receives outpatient dialysis treatments at [facility] on a MWF schedule, supervised by Dr. Candiss Norse.   Patient seen resting quietly Wife at bedside Continues to complains of biilateral shoulder blade pain Shortness of breath on exertion   Objective:  Vital signs in last 24 hours:  Temp:  [97.6 F (36.4 C)-98 F (36.7 C)] 98 F (36.7 C) (12/04 1432) Pulse Rate:  [83-91] 85 (12/04 1500) Resp:  [12-20] 14 (12/04 1500) BP: (150-174)/(78-87) 156/80 (12/04 1500) SpO2:  [96 %-99 %] 99 % (12/04 1500) Weight:  [80.1 kg] 80.1 kg (12/04 0313)  Weight change:  Filed Weights   09/14/22 1313 09/15/22 0300 09/17/22 0313  Weight: 78.1 kg 79.2 kg 80.1 kg    Intake/Output: I/O last 3 completed shifts: In: 120 [P.O.:120] Out: 0    Intake/Output this shift:  No intake/output data recorded.  Physical Exam: General: NAD ill-appearing  Head: Normocephalic, atraumatic. Dry oral mucosal membranes  Eyes: Anicteric  Lungs:  Rt Basilar crackles, normal effort, Grady O2  Heart: Regular rate and rhythm  Abdomen:  Soft, nontender, nondistended  Extremities: No peripheral edema.  Neurologic: Alert and oriented  Skin: No lesions  Access: Left aVF    Basic Metabolic Panel: Recent Labs  Lab 09/11/22 0252 09/12/22 0607 09/14/22 0521 09/15/22 0729 09/16/22 0751  NA 130* 132* 129* 129* 127*  K 4.8 5.5* 4.8 4.4 5.0  CL 95* 96* 91* 91*  88*  CO2 20* 17* 20* 23 21*  GLUCOSE 165* 125* 109* 300* 284*  BUN 83* 99* 91* 70* 92*  CREATININE 8.35* 10.60* 9.41* 7.13* 9.03*  CALCIUM 9.0 9.2 9.0 9.2 9.6     Liver Function Tests: No results for input(s): "AST", "ALT", "ALKPHOS", "BILITOT", "PROT", "ALBUMIN" in the last 168 hours. No results for input(s): "LIPASE", "AMYLASE" in the last 168 hours. No results for input(s): "AMMONIA" in the last 168 hours.  CBC: Recent Labs  Lab 09/12/22 0607 09/14/22 0521 09/15/22 0729 09/16/22 0751 09/17/22 1452  WBC 9.1 6.4 4.9 8.1 8.5  NEUTROABS  --   --  4.6 7.6  --   HGB 8.9* 8.6* 9.1* 9.1* 8.5*  HCT 25.7* 24.8* 26.1* 25.9* 24.0*  MCV 98.8 96.9 96.3 94.9 93.0  PLT 168 147* 155 136* 138*     Cardiac Enzymes: No results for input(s): "CKTOTAL", "CKMB", "CKMBINDEX", "TROPONINI" in the last 168 hours.  BNP: Invalid input(s): "POCBNP"  CBG: Recent Labs  Lab 09/16/22 1627 09/16/22 2037 09/17/22 0509 09/17/22 0819 09/17/22 1128  GLUCAP 229* 221* 323* 249* 272*     Microbiology: Results for orders placed or performed during the hospital encounter of 09/11/22  Resp Panel by RT-PCR (Flu A&B, Covid) Anterior Nasal Swab     Status: None   Collection Time: 09/11/22 12:31 AM   Specimen: Anterior Nasal Swab  Result Value Ref Range Status   SARS Coronavirus 2 by RT PCR NEGATIVE NEGATIVE Final    Comment: (NOTE) SARS-CoV-2 target nucleic acids are NOT DETECTED.  The SARS-CoV-2 RNA is generally detectable in  upper respiratory specimens during the acute phase of infection. The lowest concentration of SARS-CoV-2 viral copies this assay can detect is 138 copies/mL. A negative result does not preclude SARS-Cov-2 infection and should not be used as the sole basis for treatment or other patient management decisions. A negative result may occur with  improper specimen collection/handling, submission of specimen other than nasopharyngeal swab, presence of viral mutation(s) within  the areas targeted by this assay, and inadequate number of viral copies(<138 copies/mL). A negative result must be combined with clinical observations, patient history, and epidemiological information. The expected result is Negative.  Fact Sheet for Patients:  EntrepreneurPulse.com.au  Fact Sheet for Healthcare Providers:  IncredibleEmployment.be  This test is no t yet approved or cleared by the [place] FDA and  has been authorized for detection and/or diagnosis of SARS-CoV-2 by FDA under an Emergency Use Authorization (EUA). This EUA will remain  in effect (meaning this test can be used) for the duration of the COVID-19 declaration under Section 564(b)(1) of the Act, 21 U.S.C.section 360bbb-3(b)(1), unless the authorization is terminated  or revoked sooner.       Influenza A by PCR NEGATIVE NEGATIVE Final   Influenza B by PCR NEGATIVE NEGATIVE Final    Comment: (NOTE) The Xpert Xpress SARS-CoV-2/FLU/RSV plus assay is intended as an aid in the diagnosis of influenza from Nasopharyngeal swab specimens and should not be used as a sole basis for treatment. Nasal washings and aspirates are unacceptable for Xpert Xpress SARS-CoV-2/FLU/RSV testing.  Fact Sheet for Patients: EntrepreneurPulse.com.au  Fact Sheet for Healthcare Providers: IncredibleEmployment.be  This test is not yet approved or cleared by the [place] FDA and has been authorized for detection and/or diagnosis of SARS-CoV-2 by FDA under an Emergency Use Authorization (EUA). This EUA will remain in effect (meaning this test can be used) for the duration of the COVID-19 declaration under Section 564(b)(1) of the Act, 21 U.S.C. section 360bbb-3(b)(1), unless the authorization is terminated or revoked.  Performed at [facility], [address] [place], [place] [address]   Culture, blood (routine x 2) Call MD if unable to  obtain prior to antibiotics being given     Status: None   Collection Time: 09/11/22  5:22 AM   Specimen: BLOOD  Result Value Ref Range Status   Specimen Description BLOOD RIGHT FA  Final   Special Requests   Final    BOTTLES DRAWN AEROBIC AND ANAEROBIC Blood Culture results may not be optimal due to an inadequate volume of blood received in culture bottles   Culture   Final    NO GROWTH 5 DAYS Performed at [facility], [address] [place], [place] [address]    Report Status 09/16/2022 FINAL  Final  Culture, blood (routine x 2) Call MD if unable to obtain prior to antibiotics being given     Status: None   Collection Time: 09/12/22  6:07 AM   Specimen: BLOOD RIGHT HAND  Result Value Ref Range Status   Specimen Description BLOOD RIGHT HAND  Final   Special Requests   Final    BOTTLES DRAWN AEROBIC AND ANAEROBIC Blood Culture adequate volume   Culture   Final    NO GROWTH 5 DAYS Performed at [facility], [address] [place], [place] [address]    Report Status 09/17/2022 FINAL  Final  Surgical pcr screen     Status: None   Collection Time: 09/14/22  4:30 PM   Specimen: Nasal Mucosa; Nasal  Swab  Result Value Ref Range Status   MRSA, PCR NEGATIVE NEGATIVE Final   Staphylococcus aureus NEGATIVE NEGATIVE Final    Comment: (NOTE) The Xpert SA Assay (FDA approved for NASAL specimens in patients 80 years of age and older), is one component of a comprehensive surveillance program. It is not intended to diagnose infection nor to guide or monitor treatment. Performed at [facility], [place], [place] [address]   Respiratory (~20 pathogens) panel by PCR     Status: None   Collection Time: 09/15/22 10:12 AM   Specimen: Nasopharyngeal Swab; Respiratory  Result Value Ref Range Status   Adenovirus NOT DETECTED NOT DETECTED Final   Coronavirus 229E NOT DETECTED NOT DETECTED Final    Comment: (NOTE) The Coronavirus on the Respiratory  Panel, DOES NOT test for the novel  Coronavirus (2019 nCoV)    Coronavirus HKU1 NOT DETECTED NOT DETECTED Final   Coronavirus NL63 NOT DETECTED NOT DETECTED Final   Coronavirus OC43 NOT DETECTED NOT DETECTED Final   Metapneumovirus NOT DETECTED NOT DETECTED Final   Rhinovirus / Enterovirus NOT DETECTED NOT DETECTED Final   Influenza A NOT DETECTED NOT DETECTED Final   Influenza B NOT DETECTED NOT DETECTED Final   Parainfluenza Virus 1 NOT DETECTED NOT DETECTED Final   Parainfluenza Virus 2 NOT DETECTED NOT DETECTED Final   Parainfluenza Virus 3 NOT DETECTED NOT DETECTED Final   Parainfluenza Virus 4 NOT DETECTED NOT DETECTED Final   Respiratory Syncytial Virus NOT DETECTED NOT DETECTED Final   Bordetella pertussis NOT DETECTED NOT DETECTED Final   Bordetella Parapertussis NOT DETECTED NOT DETECTED Final   Chlamydophila pneumoniae NOT DETECTED NOT DETECTED Final   Mycoplasma pneumoniae NOT DETECTED NOT DETECTED Final    Comment: Performed at [facility], [place]. [address] [place], [place] [address]    Coagulation Studies: No results for input(s): "LABPROT", "INR" in the last 72 hours.  Urinalysis: No results for input(s): "COLORURINE", "LABSPEC", "PHURINE", "GLUCOSEU", "HGBUR", "BILIRUBINUR", "KETONESUR", "PROTEINUR", "UROBILINOGEN", "NITRITE", "LEUKOCYTESUR" in the last 72 hours.  Invalid input(s): "APPERANCEUR"    Imaging: No results found.   Medications:    anticoagulant sodium citrate     ceFEPime (MAXIPIME) IV Stopped (09/16/22 2159)    arformoterol  15 mcg Nebulization BID   budesonide (PULMICORT) nebulizer solution  0.25 mg Nebulization BID   calcium carbonate  1 tablet Oral TID WC   Chlorhexidine Gluconate Cloth  6 each Topical Q0600   gabapentin  100 mg Oral Q1200   guaiFENesin  600 mg Oral BID   heparin  5,000 Units Subcutaneous Q8H   insulin aspart  0-5 Units Subcutaneous QHS   insulin aspart  0-6 Units Subcutaneous TID WC   insulin glargine-yfgn  10  Units Subcutaneous Daily   ipratropium-albuterol  3 mL Nebulization Q6H   lidocaine  1 patch Transdermal Q24H   methocarbamol  750 mg Oral QID   [START ON 09/18/2022] predniSONE  40 mg Oral Q breakfast   sevelamer carbonate  1,600 mg Oral TID WC   acetaminophen **OR** acetaminophen, albuterol, alteplase, anticoagulant sodium citrate, heparin, hydrALAZINE, HYDROmorphone (DILAUDID) injection, lidocaine (PF), lidocaine-prilocaine, ondansetron **OR** ondansetron (ZOFRAN) IV, mouth rinse, oxyCODONE, pentafluoroprop-tetrafluoroeth  Assessment/ Plan:  Mr. Jerry [name] is a 56 y.o.  male with past medical conditions including hypertension, chronic back pain, diabetes, and end-stage renal disease on hemodialysis.  Patient presents to the emergency department from his dialysis center with shortness of breath and weakness.  Patient has been admitted  for Acute pulmonary edema (HCC) [J81.0] Pneumonia [J18.9] Acute respiratory failure with hypoxia (HCC) [J96.01] Fluid overload [E87.70]  [facility]/MWF/left aVF  End-stage renal disease on hemodialysis.  Scheduled to receive dialysis today, UF goal 2-2.5L as tolerated. Next treatment scheduled for Wednesday.  2. Anemia of chronic kidney disease Lab Results  Component Value Date   HGB 8.5 (L) 09/17/2022    Patient receives Mircera at outpatient clinic.  Hemoglobin decreased, will monitor for need of ESA.  3. Secondary Hyperparathyroidism: with outpatient labs: PTH 818, phosphorus 9.1, calcium 8.9 on 08/27/22.   Lab Results  Component Value Date   CALCIUM 9.6 09/16/2022   PHOS 3.6 04/13/2021    Bone minerals within acceptable range at this time. Continue sevelamer with meals.  4.  Community-acquired pneumonia.  Chest x-ray suspicious for multifocal pneumonia.  Respiratory panel negative.  Currently receiving cefepime per primary team. CT chest shows interstitial disease, cannot exclude superimposed pulmonary edema or  multifocal infection. Palliative care following for goals of care.    LOS: 6   12/4/20233:26 PM

## 2022-09-17 NOTE — Inpatient Diabetes Management (Signed)
Inpatient Diabetes Program Recommendations  AACE/ADA: New Consensus Statement on Inpatient Glycemic Control   Target Ranges:  Prepandial:   less than 140 mg/dL      Peak postprandial:   less than 180 mg/dL (1-2 hours)      Critically ill patients:  140 - 180 mg/dL    Latest Reference Range & Units 09/16/22 09:05 09/16/22 11:18 09/16/22 16:27 09/16/22 20:37 09/17/22 05:09 09/17/22 08:19 09/17/22 11:28  Glucose-Capillary 70 - 99 mg/dL 272 (H) 275 (H) 229 (H) 221 (H) 323 (H) 249 (H) 272 (H)   Review of Glycemic Control  Diabetes history: DM2 Outpatient Diabetes medications: None Current orders for Inpatient glycemic control: Semglee 10 units daily, Novolog 0-6 units TID with meals; Prednisone 40 mg QAM  Inpatient Diabetes Program Recommendations:    Insulin: Noted Semglee increased from 5 to 10 units daily.  If steroids are continued, please consider ordering Novolog 2 units TID with meals for meal coverage if patient eats at least 50% of meals.  Thanks, Barnie Alderman, RN, MSN, [place] Diabetes Coordinator Inpatient Diabetes Program [phone] (Team Pager from 8am to [place])

## 2022-09-17 NOTE — Progress Notes (Signed)
Patient ID: [name], male   DOB: [birth year], 56 y.o.   MRN: [id number]    Progress Note from the Palliative Medicine Team at [facility]   Patient Name: Jerry [name]        Date: 09/17/2022 DOB: Aug 29, 1966  Age: 56 y.o. MRN#: [id number] Attending Physician: Sidney Ace, MD Primary Care Physician: Freddy Finner, NP Admit Date: 09/11/2022   Medical records reviewed, assessed patient and discussed with treatment team     56 y.o. male  with past medical history of ESRD (HD), recent hospitalization (2 months ago) for GI bleed with gastric ulcers and HTN admitted on 09/11/2022 with shortness of breath status post dialysis treatment.   Patient was being treated for multifocal airway disease.  Procalcitonin continued to elevate as well as hypertensive urgency with systolic blood pressures in the 170s.   On 12/1, CT scan revealed interstitial lung disease, severe cardiomegaly with fluid overload, and radiographic findings of ESLD/cirrhosis.   Given patient's multiorgan failure and overall poor prognosis, PMC was consulted to discuss goals of care.     This NP assessed patient at the bedside as a follow up for palliative medicine needs and emotional support.   No family at bedside  This nurse practitioner along with [place] interpreter offered education to patient regarding his current medical situation; multiple comorbidities, multi organ failure, concepts specific to adult failure to thrive and the limitations of medical interventions to prolong quality of life when the body does fail to thrive.  Education offered on the difference between aggressive medical intervention path and a palliative comfort path for this patient, at this time and in this situation.  Education offered on the natural trajectory at end-of-life when patient makes decision to stop dialysis.  Prognosis is less than 2 weeks.   Education offered today regarding  the importance of continued  conversation with his family and their  medical providers regarding overall plan of care and treatment options,  ensuring decisions are within the context of the patients values and GOCs.  I spoke to patient's daughter and a family meeting is planned for tomorrow morning at 8 AM  Questions and concerns addressed   Discussed with Dr Priscella Mann and nursing team   50 minutes-greater than 50% of the time was spent in counseling and coordination of care.   Wadie Lessen NP  Palliative Medicine Team Team Phone # [phone] Pager [phone]

## 2022-09-17 NOTE — Progress Notes (Signed)
OT Cancellation Note  Patient Details Name: Jerry [name] MRN: [id number] DOB: 16-Dec-1965   Cancelled Treatment:    Reason Eval/Treat Not Completed: OT screened, no needs identified, will sign off. Per conversation with PT and chart review, no skilled acute OT needs identified. Will sign off.   Dessie Coma, M.S. OTR/L  09/17/22, 11:43 AM  ascom [phone]

## 2022-09-17 NOTE — Progress Notes (Signed)
PT Cancellation Note  Patient Details Name: Jerry [name] MRN: [id number] DOB: [birth year]   Cancelled Treatment:    Reason Eval/Treat Not Completed: Other (comment). Screen performed. Per patient, no needs and is indep with all mobility/ADLs. Per documentation, pt has been up and ambulatory in room. Will dc current orders. Notified pt to reach out if problems arise.   Marybell Robards 09/17/2022, 11:34 AM Greggory Stallion, PT, DPT, GCS [phone]

## 2022-09-17 NOTE — Progress Notes (Signed)
PROGRESS NOTE    Jerry [name]  KGY:[id number] DOB: [birth year] DOA: 09/11/2022 PCP: Freddy Finner, NP    Brief Narrative:  56 year old Spanish-speaking male with past medical history of end-stage renal disease on hemodialysis and hypertension hospitalized 2 months ago for acute GI bleed from gastric ulcers requiring 3 units packed red blood cells who presented to the emergency room on 11/27 afternoon from dialysis with shortness of breath.  Patient had completed all previous sessions of dialysis.  Workup revealed mild hypoxia and underlying multifocal airway disease and elevated procalcitonin consistent with pneumonia as well as hypertensive urgency with systolic blood pressure of 170's.  Patient admitted to the hospitalist service.   By 11/29, patient with little improvement.  Follow-up procalcitonin that day had increased to 4.74 (previously 2.3) and Rocephin/Zithromax changed to cefepime.  Although only receiving 1 dose, procalcitonin up to 8.3 by following day and CT scan of chest ordered.   By following day, patient with only mild improvement and worsening procalcitonin and antibiotics adjusted.  12/1: CAT scan reviewed.  Patient with significant interstitial lung disease, severe cardiomegaly/fluid overload, radiographic findings of ESLD/cirrhosis.  Lengthy discussion between myself, patient's wife, patient's daughter, palliative care.  Explained that patient has evidence of multiorgan failure and overall prognosis remains poor.  Patient states that he would not be want to be kept on the ventilator long-term however did want chest compressions and endotracheal intubation in the setting cardiopulmonary arrest.  12/2: Hyperglycemia noted.  Likely secondary to steroids.   12/3: No appreciable status changes.  Patient continues to complain of upper back pain.   Assessment & Plan:   Principal Problem:   Acute dyspnea Active Problems:   CAP (community acquired pneumonia)    Elevated d-dimer, possible PE   High anion gap metabolic acidosis   Hypertensive emergency   ESRD on hemodialysis (HCC)   Diabetes mellitus (HCC)   Chronic back pain   Fluid overload   PUD (peptic ulcer disease)   Pneumonia   Multi lobar pneumonia causing acute respiratory failure with hypoxia Suspected interstitial lung disease Patient's CAT scan showed diffuse interstitial disease.  No obvious consolidations.  Procalcitonin continues to increase despite cefepime.  Patient remains on 3 L nasal cannula.  His main complaint is right-sided chest wall tenderness.  He also complains of upper back pain Procalcitonin downtrending Respiratory viral panel negative Plan: Continue cefepime P.o. prednisone 40 mg daily x 5 days.  First dose today Bronchodilators Follow-up ANA/ANCA/complement, all pending Aggressive pulmonary toileting Wean oxygen as tolerated Will plan to ambulate tomorrow to assess for home oxygen need  End-stage renal disease on hemodialysis Nephrology following.  HD done 12/1   Diabetes mellitus with hyperglycemia Sugars elevated in the setting of steroid administration.   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  Volume removal with dialysis.  Multiorgan failure Functional decline Patient with evidence of advanced heart failure, kidney failure, liver failure.  Also with findings of interstitial lung disease on chest imaging.  Prognosis guarded.  Family meeting with palliative care on 12/1.  Poor  prognosis.  Palliative care follow-up 12/4  DVT prophylaxis: SQ heparin Code Status: Full Family Communication: Wife and daughter at bedside 12/1, wife at bedside 12/2, 12/3, total/4 Disposition Plan: Status is: Inpatient Remains inpatient appropriate because: Acute hypoxia.  Multifocal pneumonia on IV antibiotics.  Anticipated date of discharge 12/ 5  Level of care: Med-Surg  Consultants:  Nephrology Palliative care  Procedures:  None  Antimicrobials: Cefepime   Subjective: Seen and examined.  Continues to voice poor sleep.  Significant pain.  Objective: Vitals:   09/16/22 2036 09/17/22 0309 09/17/22 0313 09/17/22 0819  BP: (!) 174/87 (!) 160/83  (!) 155/84  Pulse: 88 84  91  Resp: 18 15  16   Temp: 97.6 F (36.4 C) 97.8 F (36.6 C)  97.7 F (36.5 C)  TempSrc: Oral Oral  Oral  SpO2: 99% 99%  99%  Weight:   80.1 kg   Height:        Intake/Output Summary (Last 24 hours) at 09/17/2022 1049 Last data filed at 09/17/2022 0300 Gross per 24 hour  Intake 120 ml  Output --  Net 120 ml   Filed Weights   09/14/22 1313 09/15/22 0300 09/17/22 0313  Weight: 78.1 kg 79.2 kg 80.1 kg    Examination:  General exam: Appears fatigued.  Ill-appearing Respiratory system: Coarse crackles bilaterally.  Normal work of breathing.  3 L Cardiovascular system: S1-S2, RRR, 2/6 murmur, 2+ pedal edema Gastrointestinal system: Soft, NT/ND, normal bowel sounds Central nervous system: Alert and oriented. No focal neurological deficits. Extremities: Decreased power symmetrically Skin: No rashes, lesions or ulcers Psychiatry: Judgement and insight appear impaired. Mood & affect flattened.     Data Reviewed: I have personally reviewed following labs and imaging studies  CBC: Recent Labs  Lab 09/11/22 0252 09/12/22 0607 09/14/22 0521 09/15/22 0729 09/16/22 0751  WBC 7.8 9.1 6.4 4.9 8.1  NEUTROABS  --   --   --  4.6 7.6  HGB 9.2* 8.9* 8.6* 9.1* 9.1*  HCT 26.5* 25.7* 24.8* 26.1*  25.9*  MCV 97.1 98.8 96.9 96.3 94.9  PLT 185 168 147* 155 127*   Basic Metabolic Panel: Recent Labs  Lab 09/11/22 0252 09/12/22 0607 09/14/22 0521 09/15/22 0729 09/16/22 0751  NA 130* 132* 129* 129* 127*  K 4.8 5.5* 4.8 4.4 5.0  CL 95* 96* 91* 91* 88*  CO2 20* 17* 20* 23 21*  GLUCOSE 165* 125* 109* 300* 284*  BUN 83* 99* 91* 70* 92*  CREATININE 8.35* 10.60* 9.41* 7.13* 9.03*  CALCIUM 9.0 9.2 9.0 9.2 9.6   GFR: Estimated Creatinine Clearance: 9 mL/min (A) (by C-G formula based on SCr of 9.03 mg/dL (H)). Liver Function Tests: No results for input(s): "AST", "ALT", "ALKPHOS", "BILITOT", "PROT", "ALBUMIN" in the last 168 hours. No results for input(s): "LIPASE", "AMYLASE" in the last 168 hours. No results for input(s): "AMMONIA" in the last 168 hours. Coagulation Profile: No results for input(s): "INR", "PROTIME" in the last 168 hours. Cardiac Enzymes: No results for input(s): "CKTOTAL", "CKMB", "CKMBINDEX", "TROPONINI" in the last 168 hours. BNP (last 3 results) No results for input(s): "PROBNP" in the last 8760 hours. HbA1C: No results for input(s): "HGBA1C" in the last 72 hours. CBG: Recent Labs  Lab 09/16/22 1118 09/16/22 1627 09/16/22 2037 09/17/22 0509 09/17/22 0819  GLUCAP 275* 229* 221* 323* 249*   Lipid Profile: No results for input(s): "CHOL", "HDL", "LDLCALC", "TRIG", "CHOLHDL", "LDLDIRECT" in the last 72 hours. Thyroid Function Tests: No results for input(s): "TSH", "T4TOTAL", "FREET4", "T3FREE", "THYROIDAB" in the last 72 hours. Anemia Panel:  No results for input(s): "VITAMINB12", "FOLATE", "FERRITIN", "TIBC", "IRON", "RETICCTPCT" in the last 72 hours. Sepsis Labs: Recent Labs  Lab 09/13/22 0511 09/14/22 0521 09/15/22 0729 09/16/22 0751  PROCALCITON 8.30 9.57 9.11 8.11    Recent Results (from the past 240 hour(s))  Resp Panel by RT-PCR (Flu A&B, Covid) Anterior Nasal Swab     Status: None   Collection Time: 09/11/22 12:31 AM   Specimen:  Anterior Nasal Swab  Result Value Ref Range Status   SARS Coronavirus 2 by RT PCR NEGATIVE NEGATIVE Final    Comment: (NOTE) SARS-CoV-2 target nucleic acids are NOT DETECTED.  The SARS-CoV-2 RNA is generally detectable in upper respiratory specimens during the acute phase of infection. The lowest concentration of SARS-CoV-2 viral copies this assay can detect is 138 copies/mL. A negative result does not preclude SARS-Cov-2 infection and should not be used as the sole basis for treatment or other patient management decisions. A negative result may occur with  improper specimen collection/handling, submission of specimen other than nasopharyngeal swab, presence of viral mutation(s) within the areas targeted by this assay, and inadequate number of viral copies(<138 copies/mL). A negative result must be combined with clinical observations, patient history, and epidemiological information. The expected result is Negative.  Fact Sheet for Patients:  EntrepreneurPulse.com.au  Fact Sheet for Healthcare Providers:  IncredibleEmployment.be  This test is no t yet approved or cleared by the [place] FDA and  has been authorized for detection and/or diagnosis of SARS-CoV-2 by FDA under an Emergency Use Authorization (EUA). This EUA will remain  in effect (meaning this test can be used) for the duration of the COVID-19 declaration under Section 564(b)(1) of the Act, 21 U.S.C.section 360bbb-3(b)(1), unless the authorization is terminated  or revoked sooner.       Influenza A by PCR NEGATIVE NEGATIVE Final   Influenza B by PCR NEGATIVE NEGATIVE Final    Comment: (NOTE) The Xpert Xpress SARS-CoV-2/FLU/RSV plus assay is intended as an aid in the diagnosis of influenza from Nasopharyngeal swab specimens and should not be used as a sole basis for treatment. Nasal washings and aspirates are unacceptable for Xpert Xpress SARS-CoV-2/FLU/RSV testing.  Fact  Sheet for Patients: EntrepreneurPulse.com.au  Fact Sheet for Healthcare Providers: IncredibleEmployment.be  This test is not yet approved or cleared by the [place] FDA and has been authorized for detection and/or diagnosis of SARS-CoV-2 by FDA under an Emergency Use Authorization (EUA). This EUA will remain in effect (meaning this test can be used) for the duration of the COVID-19 declaration under Section 564(b)(1) of the Act, 21 U.S.C. section 360bbb-3(b)(1), unless the authorization is terminated or revoked.  Performed at [facility], [address] [place], [place] [address]   Culture, blood (routine x 2) Call MD if unable to obtain prior to antibiotics being given     Status: None   Collection Time: 09/11/22  5:22 AM   Specimen: BLOOD  Result Value Ref Range Status   Specimen Description BLOOD RIGHT FA  Final   Special Requests   Final    BOTTLES DRAWN AEROBIC AND ANAEROBIC Blood Culture results may not be optimal due to an inadequate volume of blood received in culture bottles   Culture   Final    NO GROWTH 5 DAYS Performed at [facility], [address] [place], [place] [address]    Report Status 09/16/2022 FINAL  Final  Culture, blood (routine x 2) Call MD if unable to obtain prior to antibiotics being given  Status: None   Collection Time: 09/12/22  6:07 AM   Specimen: BLOOD RIGHT HAND  Result Value Ref Range Status   Specimen Description BLOOD RIGHT HAND  Final   Special Requests   Final    BOTTLES DRAWN AEROBIC AND ANAEROBIC Blood Culture adequate volume   Culture   Final    NO GROWTH 5 DAYS Performed at [facility], [address], [place], [place] [address]    Report Status 09/17/2022 FINAL  Final  Surgical pcr screen     Status: None   Collection Time: 09/14/22  4:30 PM   Specimen: Nasal Mucosa; Nasal Swab  Result Value Ref Range Status   MRSA, PCR NEGATIVE NEGATIVE Final    Staphylococcus aureus NEGATIVE NEGATIVE Final    Comment: (NOTE) The Xpert SA Assay (FDA approved for NASAL specimens in patients 31 years of age and older), is one component of a comprehensive surveillance program. It is not intended to diagnose infection nor to guide or monitor treatment. Performed at [facility], [place], [place] [address]   Respiratory (~20 pathogens) panel by PCR     Status: None   Collection Time: 09/15/22 10:12 AM   Specimen: Nasopharyngeal Swab; Respiratory  Result Value Ref Range Status   Adenovirus NOT DETECTED NOT DETECTED Final   Coronavirus 229E NOT DETECTED NOT DETECTED Final    Comment: (NOTE) The Coronavirus on the Respiratory Panel, DOES NOT test for the novel  Coronavirus (2019 nCoV)    Coronavirus HKU1 NOT DETECTED NOT DETECTED Final   Coronavirus NL63 NOT DETECTED NOT DETECTED Final   Coronavirus OC43 NOT DETECTED NOT DETECTED Final   Metapneumovirus NOT DETECTED NOT DETECTED Final   Rhinovirus / Enterovirus NOT DETECTED NOT DETECTED Final   Influenza A NOT DETECTED NOT DETECTED Final   Influenza B NOT DETECTED NOT DETECTED Final   Parainfluenza Virus 1 NOT DETECTED NOT DETECTED Final   Parainfluenza Virus 2 NOT DETECTED NOT DETECTED Final   Parainfluenza Virus 3 NOT DETECTED NOT DETECTED Final   Parainfluenza Virus 4 NOT DETECTED NOT DETECTED Final   Respiratory Syncytial Virus NOT DETECTED NOT DETECTED Final   Bordetella pertussis NOT DETECTED NOT DETECTED Final   Bordetella Parapertussis NOT DETECTED NOT DETECTED Final   Chlamydophila pneumoniae NOT DETECTED NOT DETECTED Final   Mycoplasma pneumoniae NOT DETECTED NOT DETECTED Final    Comment: Performed at [facility], [place]. [address], [place], [place] [address]         Radiology Studies: No results found.      Scheduled Meds:  arformoterol  15 mcg Nebulization BID   budesonide (PULMICORT) nebulizer solution  0.25 mg Nebulization BID   calcium  carbonate  1 tablet Oral TID WC   Chlorhexidine Gluconate Cloth  6 each Topical Q0600   gabapentin  100 mg Oral Q1200   guaiFENesin  600 mg Oral BID   heparin  5,000 Units Subcutaneous Q8H   insulin aspart  0-5 Units Subcutaneous QHS   insulin aspart  0-6 Units Subcutaneous TID WC   insulin glargine-yfgn  5 Units Subcutaneous Daily   ipratropium-albuterol  3 mL Nebulization Q6H   lidocaine  1 patch Transdermal Q24H   methocarbamol  750 mg Oral QID   predniSONE  40 mg Oral Q breakfast   sevelamer carbonate  1,600 mg Oral TID WC   Continuous Infusions:  anticoagulant sodium citrate     ceFEPime (MAXIPIME) IV Stopped (09/16/22 2159)     LOS:  6 days      Sidney Ace, MD [facility]   If 7PM-7AM, please contact night-coverage  09/17/2022, 10:49 AM

## 2022-09-17 NOTE — Progress Notes (Incomplete)
       CROSS COVER NOTE  NAME: Jerry [name] MRN: [id number] DOB : 1966/05/29 ATTENDING PHYSICIAN: Sidney Ace, MD    Date of Service   09/17/2022   HPI/Events of Note   No palpitations  Interventions   Assessment/Plan: AFIB w RVR Metoprolol x2  Cardizem X      This document was prepared using Dragon voice recognition software and may include unintentional dictation errors.  Neomia Glass DNP, MBA, FNP-BC Nurse Practitioner [facility] Pager [phone]

## 2022-09-17 NOTE — Progress Notes (Signed)
Pt tolerated 3.5 hours HD well without complication.2.5 liters fluid removed   09/17/22 1854  Vitals  Temp 97.7 F (36.5 C)  BP (!) 170/77  BP Location Right Arm  BP Method Automatic  Patient Position (if appropriate) Lying  Pulse Rate 88  Pulse Rate Source Dinamap  ECG Heart Rate 88  Resp 16  Oxygen Therapy  SpO2 99 %  O2 Device Nasal Cannula  O2 Flow Rate (L/min) 2 L/min  Patient Activity (if Appropriate) In bed  Pulse Oximetry Type Continuous  Oximetry Probe Site Changed No  Post Treatment  Dialyzer Clearance Lightly streaked  Duration of HD Treatment -hour(s) 3.5 hour(s)  Liters Processed 76.2  Fluid Removed (mL) 2.5 mL  Tolerated HD Treatment Yes  Post-Hemodialysis Comments Tolerated well  AVG/AVF Arterial Site Held (minutes) 8 minutes  AVG/AVF Venous Site Held (minutes) 8 minutes

## 2022-09-18 DIAGNOSIS — Z66 Do not resuscitate: Secondary | ICD-10-CM

## 2022-09-18 LAB — GLUCOSE, CAPILLARY
Glucose-Capillary: 234 mg/dL — ABNORMAL HIGH (ref 70–99)
Glucose-Capillary: 178 mg/dL — ABNORMAL HIGH (ref 70–99)
Glucose-Capillary: 117 mg/dL — ABNORMAL HIGH (ref 70–99)
Glucose-Capillary: 131 mg/dL — ABNORMAL HIGH (ref 70–99)

## 2022-09-18 LAB — HEPATIC FUNCTION PANEL
ALT: 19 U/L (ref 0–44)
AST: 35 U/L (ref 15–41)
Albumin: 2.6 g/dL — ABNORMAL LOW (ref 3.5–5.0)
Alkaline Phosphatase: 306 U/L — ABNORMAL HIGH (ref 38–126)
Bilirubin, Direct: 1.5 mg/dL — ABNORMAL HIGH (ref 0.0–0.2)
Indirect Bilirubin: 1.8 mg/dL — ABNORMAL HIGH (ref 0.3–0.9)
Total Bilirubin: 3.3 mg/dL — ABNORMAL HIGH (ref 0.3–1.2)
Total Protein: 8.2 g/dL — ABNORMAL HIGH (ref 6.5–8.1)

## 2022-09-18 LAB — ANA W/REFLEX IF POSITIVE: Anti Nuclear Antibody (ANA): NEGATIVE

## 2022-09-18 LAB — ANCA TITERS
Atypical P-ANCA titer: <1:20 {titer}
C-ANCA: <1:20 {titer}
P-ANCA: <1:20 {titer}

## 2022-09-18 MED ORDER — ALUM & MAG HYDROXIDE-SIMETH 200-200-20 MG/5ML PO SUSP
30.0000 mL | ORAL | Status: DC | PRN
  Administered 2022-09-18: 30 mL via ORAL
  Filled 2022-09-18 (×4): qty 30

## 2022-09-18 MED ORDER — INSULIN GLARGINE-YFGN 100 UNIT/ML ~~LOC~~ SOLN
12.0000 [IU] | Freq: Every day | SUBCUTANEOUS | Status: DC
  Administered 2022-09-19 – 2022-09-20 (×2): 12 [IU] via SUBCUTANEOUS
  Filled 2022-09-18 (×3): qty 0.12

## 2022-09-18 MED ORDER — EPOETIN ALFA 10000 UNIT/ML IJ SOLN
4000.0000 [IU] | INTRAMUSCULAR | Status: DC
  Administered 2022-09-19 – 2022-09-21 (×2): 4000 [IU] via INTRAVENOUS

## 2022-09-18 MED ORDER — DIPHENHYDRAMINE HCL 50 MG/ML IJ SOLN
12.5000 mg | Freq: Once | INTRAMUSCULAR | Status: AC
  Administered 2022-09-18: 12.5 mg via INTRAVENOUS
  Filled 2022-09-18: qty 1

## 2022-09-18 MED ORDER — NEPRO/CARBSTEADY PO LIQD
237.0000 mL | Freq: Two times a day (BID) | ORAL | Status: DC
  Administered 2022-09-18 – 2022-09-23 (×2): 237 mL via ORAL

## 2022-09-18 MED ORDER — POLYETHYLENE GLYCOL 3350 17 G PO PACK
17.0000 g | PACK | Freq: Every day | ORAL | Status: DC
  Administered 2022-09-18: 17 g via ORAL
  Filled 2022-09-18 (×2): qty 1

## 2022-09-18 MED ORDER — AMIODARONE IV BOLUS ONLY 150 MG/100ML
150.0000 mg | Freq: Once | INTRAVENOUS | Status: DC
  Filled 2022-09-18: qty 100

## 2022-09-18 MED ORDER — INSULIN ASPART 100 UNIT/ML IJ SOLN
2.0000 [IU] | Freq: Three times a day (TID) | INTRAMUSCULAR | Status: DC
  Administered 2022-09-20 (×2): 2 [IU] via SUBCUTANEOUS
  Filled 2022-09-18 (×2): qty 1

## 2022-09-18 MED ORDER — IPRATROPIUM-ALBUTEROL 0.5-2.5 (3) MG/3ML IN SOLN: 3.0000 mL | RESPIRATORY_TRACT | Status: DC | PRN

## 2022-09-18 NOTE — Progress Notes (Signed)
   09/17/22 2030  Assess: MEWS Score  Temp 97.8 F (36.6 C)  BP 125/86  MAP (mmHg) 97  Pulse Rate (!) 136 (RN Harjit Leider notified)  Resp 16  SpO2 93 %  Assess: MEWS Score  MEWS Temp 0  MEWS Systolic 0  MEWS Pulse 3  MEWS RR 0  MEWS LOC 0  MEWS Score 3  MEWS Score Color Yellow  Assess: if the MEWS score is Yellow or Red  Were vital signs taken at a resting state? Yes  Focused Assessment Change from prior assessment (see assessment flowsheet)  Does the patient meet 2 or more of the SIRS criteria? Yes  Does the patient have a confirmed or suspected source of infection? No  MEWS guidelines implemented *See Row Information* Yes  Treat  MEWS Interventions Administered scheduled meds/treatments  Take Vital Signs  Increase Vital Sign Frequency  Yellow: Q 2hr X 2 then Q 4hr X 2, if remains yellow, continue Q 4hrs  Escalate  MEWS: Escalate Yellow: discuss with charge nurse/RN and consider discussing with provider and RRT  Notify: Charge Nurse/RN  Name of Charge Nurse/RN Notified Government social research officer RN  Date Charge Nurse/RN Notified 09/17/22  Time Charge Nurse/RN Notified 2100  Provider Notification  Provider Name/Title Neomia Glass RN  Date Provider Notified 09/17/22  Time Provider Notified 2035  Method of Notification Page  Notification Reason Change in status  Provider response See new orders;At bedside  Date of Provider Response 09/17/22  Time of Provider Response 2040  Document  Patient Outcome Stabilized after interventions  Progress note created (see row info) Yes  Assess: SIRS CRITERIA  SIRS Temperature  0  SIRS Pulse 1  SIRS Respirations  0  SIRS WBC 1  SIRS Score Sum  2

## 2022-09-18 NOTE — Progress Notes (Signed)
Patient HR jumped to the 130's at 2250. Neomia Glass NP notified. EKG ordered, patient in afib RVR. Metoprolol x2 given to patient. Changed back to NSR 86 at 0029. Will continue to monitor patient

## 2022-09-18 NOTE — Progress Notes (Signed)
       CROSS COVER NOTE  NAME: Jerry [name] MRN: [id number] DOB : [birth year] ATTENDING PHYSICIAN: Sidney Ace, MD    Date of Service   09/18/2022   HPI/Events of Note   Medication request received for nausea refractory to zofran and constipation.  Interventions   Assessment/Plan:  Miralax Benadryl      This document was prepared using Dragon voice recognition software and may include unintentional dictation errors.  Neomia Glass DNP, MBA, FNP-BC Nurse Practitioner [facility] Pager [phone]

## 2022-09-18 NOTE — Progress Notes (Signed)
[facility]  ROUNDING NOTE   Subjective:   Jerry [name] is a 56 year old Spanish-speaking male with past medical conditions including hypertension, chronic back pain, diabetes, and end-stage renal disease on hemodialysis.  Patient presents to the emergency department from his dialysis center with shortness of breath and weakness.  Patient has been admitted for Acute pulmonary edema (HCC) [J81.0] Pneumonia [J18.9] Acute respiratory failure with hypoxia ([place]) [J96.01] Fluid overload [E87.70]  Patient is known to our practice and receives outpatient dialysis treatments at [facility] on a MWF schedule, supervised by Dr. Candiss Norse.   Patient seen and evaluated at bedside Wife and other family members at bedside Tolerating meals Continues to have intermittent cough Complains of back and abdominal pain, associated with cough Lower extremity edema present   Objective:  Vital signs in last 24 hours:  Temp:  [97.7 F (36.5 C)-97.9 F (36.6 C)] 97.7 F (36.5 C) (12/05 1546) Pulse Rate:  [81-137] 81 (12/05 1546) Resp:  [11-20] 20 (12/05 1546) BP: (105-172)/(59-91) 141/91 (12/05 1546) SpO2:  [93 %-99 %] 97 % (12/05 1546) Weight:  [77.7 kg-80.1 kg] 80.1 kg (12/05 0500)  Weight change: -2.4 kg Filed Weights   09/17/22 0313 09/17/22 1854 09/18/22 0500  Weight: 80.1 kg 77.7 kg 80.1 kg    Intake/Output: I/O last 3 completed shifts: In: 120 [P.O.:120] Out: 2.5 [Other:2.5]   Intake/Output this shift:  No intake/output data recorded.  Physical Exam: General: NAD  Head: Normocephalic, atraumatic. Dry oral mucosal membranes  Eyes: Anicteric  Lungs:  Bilateral basilar crackles, normal effort, [place] O2  Heart: Regular rate and rhythm  Abdomen:  Soft, nontender, nondistended  Extremities: 1+ peripheral edema.  Neurologic: Alert and oriented  Skin: No lesions  Access: Left aVF    Basic Metabolic Panel: Recent Labs  Lab 09/12/22 0607 09/14/22 0521  09/15/22 0729 09/16/22 0751 09/17/22 1452  NA 132* 129* 129* 127* 126*  K 5.5* 4.8 4.4 5.0 4.8  CL 96* 91* 91* 88* 86*  CO2 17* 20* 23 21* 23  GLUCOSE 125* 109* 300* 284* 184*  BUN 99* 91* 70* 92* 126*  CREATININE 10.60* 9.41* 7.13* 9.03* 10.10*  CALCIUM 9.2 9.0 9.2 9.6 9.0  PHOS  --   --   --   --  8.8*     Liver Function Tests: Recent Labs  Lab 09/17/22 1452 09/18/22 0011  AST  --  35  ALT  --  19  ALKPHOS  --  306*  BILITOT  --  3.3*  PROT  --  8.2*  ALBUMIN 2.7* 2.6*   No results for input(s): "LIPASE", "AMYLASE" in the last 168 hours. No results for input(s): "AMMONIA" in the last 168 hours.  CBC: Recent Labs  Lab 09/12/22 0607 09/14/22 0521 09/15/22 0729 09/16/22 0751 09/17/22 1452  WBC 9.1 6.4 4.9 8.1 8.5  NEUTROABS  --   --  4.6 7.6  --   HGB 8.9* 8.6* 9.1* 9.1* 8.5*  HCT 25.7* 24.8* 26.1* 25.9* 24.0*  MCV 98.8 96.9 96.3 94.9 93.0  PLT 168 147* 155 136* 138*     Cardiac Enzymes: No results for input(s): "CKTOTAL", "CKMB", "CKMBINDEX", "TROPONINI" in the last 168 hours.  BNP: Invalid input(s): "POCBNP"  CBG: Recent Labs  Lab 09/17/22 0819 09/17/22 1128 09/17/22 2046 09/18/22 0856 09/18/22 1201  GLUCAP 249* 272* 189* 234* 178*     Microbiology: Results for orders placed or performed during the hospital encounter of 09/11/22  Resp Panel by RT-PCR (Flu A&B, Covid) Anterior  Nasal Swab     Status: None   Collection Time: 09/11/22 12:31 AM   Specimen: Anterior Nasal Swab  Result Value Ref Range Status   SARS Coronavirus 2 by RT PCR NEGATIVE NEGATIVE Final    Comment: (NOTE) SARS-CoV-2 target nucleic acids are NOT DETECTED.  The SARS-CoV-2 RNA is generally detectable in upper respiratory specimens during the acute phase of infection. The lowest concentration of SARS-CoV-2 viral copies this assay can detect is 138 copies/mL. A negative result does not preclude SARS-Cov-2 infection and should not be used as the sole basis for treatment  or other patient management decisions. A negative result may occur with  improper specimen collection/handling, submission of specimen other than nasopharyngeal swab, presence of viral mutation(s) within the areas targeted by this assay, and inadequate number of viral copies(<138 copies/mL). A negative result must be combined with clinical observations, patient history, and epidemiological information. The expected result is Negative.  Fact Sheet for Patients:  EntrepreneurPulse.com.au  Fact Sheet for Healthcare Providers:  IncredibleEmployment.be  This test is no t yet approved or cleared by the [place] FDA and  has been authorized for detection and/or diagnosis of SARS-CoV-2 by FDA under an Emergency Use Authorization (EUA). This EUA will remain  in effect (meaning this test can be used) for the duration of the COVID-19 declaration under Section 564(b)(1) of the Act, 21 U.S.C.section 360bbb-3(b)(1), unless the authorization is terminated  or revoked sooner.       Influenza A by PCR NEGATIVE NEGATIVE Final   Influenza B by PCR NEGATIVE NEGATIVE Final    Comment: (NOTE) The Xpert Xpress SARS-CoV-2/FLU/RSV plus assay is intended as an aid in the diagnosis of influenza from Nasopharyngeal swab specimens and should not be used as a sole basis for treatment. Nasal washings and aspirates are unacceptable for Xpert Xpress SARS-CoV-2/FLU/RSV testing.  Fact Sheet for Patients: EntrepreneurPulse.com.au  Fact Sheet for Healthcare Providers: IncredibleEmployment.be  This test is not yet approved or cleared by the [place] FDA and has been authorized for detection and/or diagnosis of SARS-CoV-2 by FDA under an Emergency Use Authorization (EUA). This EUA will remain in effect (meaning this test can be used) for the duration of the COVID-19 declaration under Section 564(b)(1) of the Act, 21 U.S.C. section  360bbb-3(b)(1), unless the authorization is terminated or revoked.  Performed at [facility], [address] [place], [place] [address]   Culture, blood (routine x 2) Call MD if unable to obtain prior to antibiotics being given     Status: None   Collection Time: 09/11/22  5:22 AM   Specimen: BLOOD  Result Value Ref Range Status   Specimen Description BLOOD RIGHT FA  Final   Special Requests   Final    BOTTLES DRAWN AEROBIC AND ANAEROBIC Blood Culture results may not be optimal due to an inadequate volume of blood received in culture bottles   Culture   Final    NO GROWTH 5 DAYS Performed at [facility], [address], [place], [place] [address]    Report Status 09/16/2022 FINAL  Final  Culture, blood (routine x 2) Call MD if unable to obtain prior to antibiotics being given     Status: None   Collection Time: 09/12/22  6:07 AM   Specimen: BLOOD RIGHT HAND  Result Value Ref Range Status   Specimen Description BLOOD RIGHT HAND  Final   Special Requests   Final    BOTTLES DRAWN AEROBIC AND ANAEROBIC Blood Culture adequate volume  Culture   Final    NO GROWTH 5 DAYS Performed at [facility], [address] [place], [place] [address]    Report Status 09/17/2022 FINAL  Final  Surgical pcr screen     Status: None   Collection Time: 09/14/22  4:30 PM   Specimen: Nasal Mucosa; Nasal Swab  Result Value Ref Range Status   MRSA, PCR NEGATIVE NEGATIVE Final   Staphylococcus aureus NEGATIVE NEGATIVE Final    Comment: (NOTE) The Xpert SA Assay (FDA approved for NASAL specimens in patients 71 years of age and older), is one component of a comprehensive surveillance program. It is not intended to diagnose infection nor to guide or monitor treatment. Performed at [facility], [place], [place] [address]   Respiratory (~20 pathogens) panel by PCR     Status: None   Collection Time: 09/15/22 10:12 AM   Specimen: Nasopharyngeal  Swab; Respiratory  Result Value Ref Range Status   Adenovirus NOT DETECTED NOT DETECTED Final   Coronavirus 229E NOT DETECTED NOT DETECTED Final    Comment: (NOTE) The Coronavirus on the Respiratory Panel, DOES NOT test for the novel  Coronavirus (2019 nCoV)    Coronavirus HKU1 NOT DETECTED NOT DETECTED Final   Coronavirus NL63 NOT DETECTED NOT DETECTED Final   Coronavirus OC43 NOT DETECTED NOT DETECTED Final   Metapneumovirus NOT DETECTED NOT DETECTED Final   Rhinovirus / Enterovirus NOT DETECTED NOT DETECTED Final   Influenza A NOT DETECTED NOT DETECTED Final   Influenza B NOT DETECTED NOT DETECTED Final   Parainfluenza Virus 1 NOT DETECTED NOT DETECTED Final   Parainfluenza Virus 2 NOT DETECTED NOT DETECTED Final   Parainfluenza Virus 3 NOT DETECTED NOT DETECTED Final   Parainfluenza Virus 4 NOT DETECTED NOT DETECTED Final   Respiratory Syncytial Virus NOT DETECTED NOT DETECTED Final   Bordetella pertussis NOT DETECTED NOT DETECTED Final   Bordetella Parapertussis NOT DETECTED NOT DETECTED Final   Chlamydophila pneumoniae NOT DETECTED NOT DETECTED Final   Mycoplasma pneumoniae NOT DETECTED NOT DETECTED Final    Comment: Performed at [facility], [place]. [address] [place], [place] [address]    Coagulation Studies: No results for input(s): "LABPROT", "INR" in the last 72 hours.  Urinalysis: No results for input(s): "COLORURINE", "LABSPEC", "PHURINE", "GLUCOSEU", "HGBUR", "BILIRUBINUR", "KETONESUR", "PROTEINUR", "UROBILINOGEN", "NITRITE", "LEUKOCYTESUR" in the last 72 hours.  Invalid input(s): "APPERANCEUR"    Imaging: No results found.   Medications:    anticoagulant sodium citrate     ceFEPime (MAXIPIME) IV 1 g (09/17/22 2314)    calcium carbonate  1 tablet Oral TID WC   Chlorhexidine Gluconate Cloth  6 each Topical Q0600   feeding supplement (NEPRO CARB STEADY)  237 mL Oral BID BM   gabapentin  100 mg Oral Q1200   guaiFENesin  600 mg Oral BID   heparin  5,000  Units Subcutaneous Q8H   insulin aspart  0-5 Units Subcutaneous QHS   insulin aspart  0-6 Units Subcutaneous TID WC   insulin aspart  2 Units Subcutaneous TID WC   [START ON 09/19/2022] insulin glargine-yfgn  12 Units Subcutaneous Daily   lidocaine  1 patch Transdermal Q24H   methocarbamol  750 mg Oral QID   polyethylene glycol  17 g Oral Daily   predniSONE  40 mg Oral Q breakfast   sevelamer carbonate  1,600 mg Oral TID WC   acetaminophen **OR** acetaminophen, albuterol, alteplase, alum & mag hydroxide-simeth, anticoagulant sodium citrate, heparin, hydrALAZINE,  HYDROmorphone (DILAUDID) injection, ipratropium-albuterol, lidocaine (PF), lidocaine-prilocaine, ondansetron **OR** ondansetron (ZOFRAN) IV, mouth rinse, oxyCODONE, pentafluoroprop-tetrafluoroeth  Assessment/ Plan:  Jerry [name] is a 56 y.o.  male with past medical conditions including hypertension, chronic back pain, diabetes, and end-stage renal disease on hemodialysis.  Patient presents to the emergency department from his dialysis center with shortness of breath and weakness.  Patient has been admitted for Acute pulmonary edema (HCC) [J81.0] Pneumonia [J18.9] Acute respiratory failure with hypoxia (HCC) [J96.01] Fluid overload [E87.70]  [facility]/MWF/left aVF  End-stage renal disease on hemodialysis.  Scheduled for dialysis tomorrow, UF goal 3 L as tolerated.  2. Anemia of chronic kidney disease Lab Results  Component Value Date   HGB 8.5 (L) 09/17/2022    Patient receives Mircera at outpatient clinic.  We will continue to monitor hemoglobin.  3. Secondary Hyperparathyroidism: with outpatient labs: PTH 818, phosphorus 9.1, calcium 8.9 on 08/27/22.   Lab Results  Component Value Date   CALCIUM 9.0 09/17/2022   PHOS 8.8 (H) 09/17/2022    Bone minerals within acceptable range at this time. Continue sevelamer with meals.  4.  Community-acquired pneumonia.  Chest x-ray suspicious for  multifocal pneumonia.  Respiratory panel negative.  Currently receiving cefepime per primary team. CT chest shows interstitial disease, cannot exclude superimposed pulmonary edema or multifocal infection. Palliative care following for goals of care.    LOS: [place] 12/5/20233:52 PM

## 2022-09-18 NOTE — TOC Progression Note (Signed)
Transition of Care [facility]) - Progression Note    Patient Details  Name: Jerry [name] MRN: [id number] Date of Birth: [birth year]  Transition [facility]) CM/SW [place], RN Phone Number: 09/18/2022, 1:45 PM  Clinical Narrative:    TOC continues to follow the aptient to asssit with DC planning and needs  Palliative care had a discussion with patient and multiple family members including 4 children today. Patient now a DNR. Further discussion about goals of care with palliative scheduled for 12/6.    Expected Discharge Plan: Home/Self Care  Expected Discharge Plan and Services Expected Discharge Plan: Home/Self Care     Post Acute Care Choice: Durable Medical Equipment                                         Social Determinants of Health (SDOH) Interventions    Readmission Risk Interventions     No data to display

## 2022-09-18 NOTE — Inpatient Diabetes Management (Signed)
Inpatient Diabetes Program Recommendations  AACE/ADA: New Consensus Statement on Inpatient Glycemic Control   Target Ranges:  Prepandial:   less than 140 mg/dL      Peak postprandial:   less than 180 mg/dL (1-2 hours)      Critically ill patients:  140 - 180 mg/dL    Latest Reference Range & Units 09/17/22 08:19 09/17/22 11:28 09/17/22 20:46 09/18/22 08:56  Glucose-Capillary 70 - 99 mg/dL 249 (H) 272 (H) 189 (H) 234 (H)   Review of Glycemic Control  Diabetes history: DM2 Outpatient Diabetes medications: None Current orders for Inpatient glycemic control: Semglee 10 units daily, Novolog 0-6 units TID with meals, Novolog 0-5 units QHS; Prednisone 40 mg QAM   Inpatient Diabetes Program Recommendations:     Insulin:  If steroids are continued, please consider increasing Semglee to 12 units daily and ordering Novolog 2 units TID with meals for meal coverage if patient eats at least 50% of meals.  Thanks, Barnie Alderman, RN, MSN, [place] [facility] Coordinator Inpatient Diabetes Program [phone] (Team Pager from 8am to [place])

## 2022-09-18 NOTE — Progress Notes (Signed)
Patient ID: [name], male   DOB: 21-Oct-1965, 56 y.o.   MRN: [id number]    Progress Note from the Palliative Medicine Team at [facility]   Patient Name: Jerry [name]        Date: 09/18/2022 DOB: [birth year]  Age: 56 y.o. MRN#: [id number] Attending Physician: Sidney Ace, MD Primary Care Physician: Freddy Finner, NP Admit Date: 09/11/2022   Medical records reviewed, assessed patient and discussed with treatment team    56 y.o. male  with past medical history of ESRD (HD), recent hospitalization (2 months ago) for GI bleed with gastric ulcers and HTN admitted on 09/11/2022 with shortness of breath status post dialysis treatment.   Patient was being treated for multifocal airway disease.  Procalcitonin continued to elevate as well as hypertensive urgency with systolic blood pressures in the 170s.   On 12/1, CT scan revealed interstitial lung disease, severe cardiomegaly with fluid overload, and radiographic findings of ESLD/cirrhosis.   Given patient's multiorgan failure and overall poor prognosis, PMC was consulted to discuss goals of care.     This NP assessed patient at the bedside as a follow up for palliative medicine needs and emotional support, and to meet with family as scheduled for goals of care meeting.  Patient's wife and 4 children are at bedside.  Patient is alert and oriented.    This nurse practitioner along with [place] interpreter offered education to patient and his family regarding his current medical situation; multiple comorbidities, multi organ failure, concepts specific to adult failure to thrive and the limitations of medical interventions to prolong quality of life when the body does fail to thrive.   Created space and opportunity for patient and his family to explore their thoughts and feelings regarding current medical situation.    All family members verbalize a clear understanding of the seriousness of the patient's current  medical situation and the reality that patient is running out of options to continue life-prolonging measures.    All agree that patient cannot return home and expect to continue with outpatient dialysis.    Education offered on the difference between aggressive medical intervention path and a palliative comfort path for this patient, at this time and in this situation.    Education offered on hospice benefit; philosophy and eligibility  Education offered on the natural trajectory at end-of-life when patient makes decision to stop dialysis.  Prognosis being  less than 2 weeks.  Plan of care -DNR/DNI-documented today with patient in full family support -Continue current treatment plan until family have more time--patient and family are leaning towards a comfort path foregoing dialysis  Family plan to take the next 24 hours to continue today's conversation as patient makes decisions regarding the next steps in his treatment plan.  This nurse practitioner will follow-up in the morning  Education offered on residential hospice benefit  Questions and concerns addressed   Discussed with Dr Priscella Mann and nursing team   70  minutes-greater than 50% of the time was spent in counseling and coordination of care.   Wadie Lessen NP  Palliative Medicine Team Team Phone # [phone] Pager [phone]

## 2022-09-18 NOTE — Progress Notes (Signed)
PROGRESS NOTE    Jerry [name]  BOF:[id number] DOB: 27-Jul-1966 DOA: 09/11/2022 PCP: Freddy Finner, NP    Brief Narrative:  56 year old Spanish-speaking male with past medical history of end-stage renal disease on hemodialysis and hypertension hospitalized 2 months ago for acute GI bleed from gastric ulcers requiring 3 units packed red blood cells who presented to the emergency room on 11/27 afternoon from dialysis with shortness of breath.  Patient had completed all previous sessions of dialysis.  Workup revealed mild hypoxia and underlying multifocal airway disease and elevated procalcitonin consistent with pneumonia as well as hypertensive urgency with systolic blood pressure of 170's.  Patient admitted to the hospitalist service.   By 11/29, patient with little improvement.  Follow-up procalcitonin that day had increased to 4.74 (previously 2.3) and Rocephin/Zithromax changed to cefepime.  Although only receiving 1 dose, procalcitonin up to 8.3 by following day and CT scan of chest ordered.   By following day, patient with only mild improvement and worsening procalcitonin and antibiotics adjusted.  12/1: CAT scan reviewed.  Patient with significant interstitial lung disease, severe cardiomegaly/fluid overload, radiographic findings of ESLD/cirrhosis.  Lengthy discussion between myself, patient's wife, patient's daughter, palliative care.  Explained that patient has evidence of multiorgan failure and overall prognosis remains poor.  Patient states that he would not be want to be kept on the ventilator long-term however did want chest compressions and endotracheal intubation in the setting cardiopulmonary arrest.  12/2: Hyperglycemia noted.  Likely secondary to steroids.   12/3: No appreciable status changes.  Patient continues to complain of upper back pain. 12/5: No appreciable clinical changes.  Patient continues to endorse significant pain mainly in upper back.  Palliative care had  a discussion with patient and multiple family members including 4 children today.  Patient now a DNR.  Further discussion about goals of care with palliative scheduled for 12/6.   Assessment & Plan:   Principal Problem:   Acute dyspnea Active Problems:   CAP (community acquired pneumonia)   Elevated d-dimer, possible PE   High anion gap metabolic acidosis   Hypertensive emergency   ESRD on hemodialysis (HCC)   Diabetes mellitus (HCC)   Chronic back pain   Fluid overload   PUD (peptic ulcer disease)   Pneumonia   Multi lobar pneumonia causing acute respiratory failure with hypoxia Suspected interstitial lung disease Patient's CAT scan showed diffuse interstitial disease.  No obvious consolidations.  Procalcitonin continues to increase despite cefepime.  Patient remains on 3 L nasal cannula.  His main complaint is right-sided chest wall tenderness.  He also complains of upper back pain Procalcitonin downtrending Respiratory viral panel negative Plan: Continue cefepime, plan for 7-day total course.  Will discontinue if patient has received 7 total days P.o. prednisone 40 mg daily x 5 days.  Dose 2/5 Bronchodilators Follow-up ANA/ANCA/complement, all pending Aggressive pulmonary toileting Wean oxygen as tolerated Ambulate today to assess for home oxygen needs  End-stage renal disease on hemodialysis Nephrology follow for inpatient HD needs   Diabetes mellitus with hyperglycemia Sugars elevated in the setting of steroid administration.   Increase Semglee to 12 units daily Add NovoLog 2 units 3 times daily with meals Sensitive sliding scale Sugar should improve as steroids are weaned   Malignant hypertension Multifactorial secondary to pain, shortness of breath as well as increasing volume overload, better with dialysis   Overweight: Meets criteria for BMI greater than 25  Likely end-stage liver disease/cirrhosis Radiographic finding with findings of mild ascites.  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  Volume removal with dialysis.  Multiorgan failure Functional decline Patient with evidence of advanced heart failure, kidney failure, liver failure.  Also with findings of interstitial lung disease on chest imaging.  Significant pain burden.  Prognosis guarded.  Family meeting occurred with palliative care and multiple children on 12/5.  Patient now DNR.  Further palliative discussions scheduled for 12/6.    DVT prophylaxis: SQ heparin Code Status: DNR Family Communication: Wife and daughter at bedside 12/1, wife at bedside 12/2, 12/3, 12/4 Disposition Plan: Status is: Inpatient Remains inpatient appropriate because: Acute hypoxia.  Multifocal pneumonia on IV antibiotics.  Unclear discharge plan at this time.  Possibly home with home oxygen on 12/6.  Level of care: Med-Surg  Consultants:  Nephrology Palliative care  Procedures:  None  Antimicrobials: Cefepime   Subjective: Seen and examined.  Continues to voice poor sleep.  Significant pain.  Objective: Vitals:   09/18/22 0418 09/18/22 0500 09/18/22 0725 09/18/22 0853  BP: 139/79   (!) 145/75  Pulse: 83  84 88  Resp: 18  18 14   Temp: 97.8 F (36.6 C)   97.8 F (36.6 C)  TempSrc: Oral     SpO2: 95%  93% 93%  Weight:  80.1 kg    Height:        Intake/Output Summary (Last 24 hours) at 09/18/2022 1256 Last data filed at 09/17/2022 1854 Gross per 24 hour  Intake --  Output 2.5 ml  Net -2.5 ml   Filed Weights   09/17/22 0313 09/17/22 1854 09/18/22 0500  Weight: 80.1 kg 77.7 kg 80.1 kg    Examination:  General exam: Fatigued.  Ill-appearing Respiratory system: Bibasilar coarse crackles.  Normal work of breathing.  3 L Cardiovascular system: S1-S2, RRR, 2/6 murmur, 1++ pedal edema Gastrointestinal system: Soft, NT/ND, normal bowel sounds Central nervous system: Alert and oriented. No focal  neurological deficits. Extremities: Decreased power symmetrically Skin: No rashes, lesions or ulcers Psychiatry: Judgement and insight appear impaired. Mood & affect flattened.     Data Reviewed: I have personally reviewed following labs and imaging studies  CBC: Recent Labs  Lab 09/12/22 0607 09/14/22 0521 09/15/22 0729 09/16/22 0751 09/17/22 1452  WBC 9.1 6.4 4.9 8.1 8.5  NEUTROABS  --   --  4.6 7.6  --   HGB 8.9* 8.6* 9.1* 9.1* 8.5*  HCT 25.7* 24.8* 26.1* 25.9* 24.0*  MCV 98.8 96.9 96.3 94.9 93.0  PLT 168 147* 155 136* 878*   Basic Metabolic Panel: Recent Labs  Lab 09/12/22 0607 09/14/22 0521 09/15/22 0729 09/16/22 0751 09/17/22 1452  NA 132* 129* 129* 127* 126*  K 5.5* 4.8 4.4 5.0 4.8  CL 96* 91* 91* 88* 86*  CO2 17* 20* 23 21* 23  GLUCOSE 125* 109* 300* 284* 184*  BUN 99* 91* 70* 92* 126*  CREATININE 10.60* 9.41* 7.13* 9.03* 10.10*  CALCIUM 9.2 9.0 9.2 9.6 9.0  PHOS  --   --   --   --  8.8*   GFR: Estimated Creatinine Clearance: 8 mL/min (A) (by C-G formula based on SCr of 10.1 mg/dL (H)). Liver Function Tests: Recent Labs  Lab 09/17/22 1452 09/18/22 0011  AST  --  35  ALT  --  19  ALKPHOS  --  306*  BILITOT  --  3.3*  PROT  --  8.2*  ALBUMIN 2.7* 2.6*   No results for input(s): "LIPASE", "  AMYLASE" in the last 168 hours. No results for input(s): "AMMONIA" in the last 168 hours. Coagulation Profile: No results for input(s): "INR", "PROTIME" in the last 168 hours. Cardiac Enzymes: No results for input(s): "CKTOTAL", "CKMB", "CKMBINDEX", "TROPONINI" in the last 168 hours. BNP (last 3 results) No results for input(s): "PROBNP" in the last 8760 hours. HbA1C: No results for input(s): "HGBA1C" in the last 72 hours. CBG: Recent Labs  Lab 09/17/22 0819 09/17/22 1128 09/17/22 2046 09/18/22 0856 09/18/22 1201  GLUCAP 249* 272* 189* 234* 178*   Lipid Profile: No results for input(s): "CHOL", "HDL", "LDLCALC", "TRIG", "CHOLHDL", "LDLDIRECT" in the  last 72 hours. Thyroid Function Tests: No results for input(s): "TSH", "T4TOTAL", "FREET4", "T3FREE", "THYROIDAB" in the last 72 hours. Anemia Panel: No results for input(s): "VITAMINB12", "FOLATE", "FERRITIN", "TIBC", "IRON", "RETICCTPCT" in the last 72 hours. Sepsis Labs: Recent Labs  Lab 09/13/22 0511 09/14/22 0521 09/15/22 0729 09/16/22 0751  PROCALCITON 8.30 9.57 9.11 8.11    Recent Results (from the past 240 hour(s))  Resp Panel by RT-PCR (Flu A&B, Covid) Anterior Nasal Swab     Status: None   Collection Time: 09/11/22 12:31 AM   Specimen: Anterior Nasal Swab  Result Value Ref Range Status   SARS Coronavirus 2 by RT PCR NEGATIVE NEGATIVE Final    Comment: (NOTE) SARS-CoV-2 target nucleic acids are NOT DETECTED.  The SARS-CoV-2 RNA is generally detectable in upper respiratory specimens during the acute phase of infection. The lowest concentration of SARS-CoV-2 viral copies this assay can detect is 138 copies/mL. A negative result does not preclude SARS-Cov-2 infection and should not be used as the sole basis for treatment or other patient management decisions. A negative result may occur with  improper specimen collection/handling, submission of specimen other than nasopharyngeal swab, presence of viral mutation(s) within the areas targeted by this assay, and inadequate number of viral copies(<138 copies/mL). A negative result must be combined with clinical observations, patient history, and epidemiological information. The expected result is Negative.  Fact Sheet for Patients:  EntrepreneurPulse.com.au  Fact Sheet for Healthcare Providers:  IncredibleEmployment.be  This test is no t yet approved or cleared by the [place] FDA and  has been authorized for detection and/or diagnosis of SARS-CoV-2 by FDA under an Emergency Use Authorization (EUA). This EUA will remain  in effect (meaning this test can be used) for the duration  of the COVID-19 declaration under Section 564(b)(1) of the Act, 21 U.S.C.section 360bbb-3(b)(1), unless the authorization is terminated  or revoked sooner.       Influenza A by PCR NEGATIVE NEGATIVE Final   Influenza B by PCR NEGATIVE NEGATIVE Final    Comment: (NOTE) The Xpert Xpress SARS-CoV-2/FLU/RSV plus assay is intended as an aid in the diagnosis of influenza from Nasopharyngeal swab specimens and should not be used as a sole basis for treatment. Nasal washings and aspirates are unacceptable for Xpert Xpress SARS-CoV-2/FLU/RSV testing.  Fact Sheet for Patients: EntrepreneurPulse.com.au  Fact Sheet for Healthcare Providers: IncredibleEmployment.be  This test is not yet approved or cleared by the [place] FDA and has been authorized for detection and/or diagnosis of SARS-CoV-2 by FDA under an Emergency Use Authorization (EUA). This EUA will remain in effect (meaning this test can be used) for the duration of the COVID-19 declaration under Section 564(b)(1) of the Act, 21 U.S.C. section 360bbb-3(b)(1), unless the authorization is terminated or revoked.  Performed at [facility], [address], [place], [place] [address]   Culture, blood (routine x  2) Call MD if unable to obtain prior to antibiotics being given     Status: None   Collection Time: 09/11/22  5:22 AM   Specimen: BLOOD  Result Value Ref Range Status   Specimen Description BLOOD RIGHT FA  Final   Special Requests   Final    BOTTLES DRAWN AEROBIC AND ANAEROBIC Blood Culture results may not be optimal due to an inadequate volume of blood received in culture bottles   Culture   Final    NO GROWTH 5 DAYS Performed at [facility], [address], [place], [place] [address]    Report Status 09/16/2022 FINAL  Final  Culture, blood (routine x 2) Call MD if unable to obtain prior to antibiotics being given     Status: None   Collection Time: 09/12/22   6:07 AM   Specimen: BLOOD RIGHT HAND  Result Value Ref Range Status   Specimen Description BLOOD RIGHT HAND  Final   Special Requests   Final    BOTTLES DRAWN AEROBIC AND ANAEROBIC Blood Culture adequate volume   Culture   Final    NO GROWTH 5 DAYS Performed at [facility], [address] [place], [place] [address]    Report Status 09/17/2022 FINAL  Final  Surgical pcr screen     Status: None   Collection Time: 09/14/22  4:30 PM   Specimen: Nasal Mucosa; Nasal Swab  Result Value Ref Range Status   MRSA, PCR NEGATIVE NEGATIVE Final   Staphylococcus aureus NEGATIVE NEGATIVE Final    Comment: (NOTE) The Xpert SA Assay (FDA approved for NASAL specimens in patients 102 years of age and older), is one component of a comprehensive surveillance program. It is not intended to diagnose infection nor to guide or monitor treatment. Performed at [facility], [place], [place] [address]   Respiratory (~20 pathogens) panel by PCR     Status: None   Collection Time: 09/15/22 10:12 AM   Specimen: Nasopharyngeal Swab; Respiratory  Result Value Ref Range Status   Adenovirus NOT DETECTED NOT DETECTED Final   Coronavirus 229E NOT DETECTED NOT DETECTED Final    Comment: (NOTE) The Coronavirus on the Respiratory Panel, DOES NOT test for the novel  Coronavirus (2019 nCoV)    Coronavirus HKU1 NOT DETECTED NOT DETECTED Final   Coronavirus NL63 NOT DETECTED NOT DETECTED Final   Coronavirus OC43 NOT DETECTED NOT DETECTED Final   Metapneumovirus NOT DETECTED NOT DETECTED Final   Rhinovirus / Enterovirus NOT DETECTED NOT DETECTED Final   Influenza A NOT DETECTED NOT DETECTED Final   Influenza B NOT DETECTED NOT DETECTED Final   Parainfluenza Virus 1 NOT DETECTED NOT DETECTED Final   Parainfluenza Virus 2 NOT DETECTED NOT DETECTED Final   Parainfluenza Virus 3 NOT DETECTED NOT DETECTED Final   Parainfluenza Virus 4 NOT DETECTED NOT DETECTED Final   Respiratory  Syncytial Virus NOT DETECTED NOT DETECTED Final   Bordetella pertussis NOT DETECTED NOT DETECTED Final   Bordetella Parapertussis NOT DETECTED NOT DETECTED Final   Chlamydophila pneumoniae NOT DETECTED NOT DETECTED Final   Mycoplasma pneumoniae NOT DETECTED NOT DETECTED Final    Comment: Performed at [facility], [place]. [address] [place],  [address]         Radiology Studies: No results found.      Scheduled Meds:  calcium carbonate  1 tablet Oral TID WC   Chlorhexidine Gluconate Cloth  6 each Topical Q0600   feeding supplement (  NEPRO CARB STEADY)  237 mL Oral BID BM   gabapentin  100 mg Oral Q1200   guaiFENesin  600 mg Oral BID   heparin  5,000 Units Subcutaneous Q8H   insulin aspart  0-5 Units Subcutaneous QHS   insulin aspart  0-6 Units Subcutaneous TID WC   insulin aspart  2 Units Subcutaneous TID WC   [START ON 09/19/2022] insulin glargine-yfgn  12 Units Subcutaneous Daily   lidocaine  1 patch Transdermal Q24H   methocarbamol  750 mg Oral QID   polyethylene glycol  17 g Oral Daily   predniSONE  40 mg Oral Q breakfast   sevelamer carbonate  1,600 mg Oral TID WC   Continuous Infusions:  anticoagulant sodium citrate     ceFEPime (MAXIPIME) IV 1 g (09/17/22 2314)     LOS: 7 days      Sidney Ace, MD [facility]   If 7PM-7AM, please contact night-coverage  09/18/2022, 12:56 PM

## 2022-09-19 DIAGNOSIS — I5043 Acute on chronic combined systolic (congestive) and diastolic (congestive) heart failure: Secondary | ICD-10-CM

## 2022-09-19 DIAGNOSIS — D638 Anemia in other chronic diseases classified elsewhere: Secondary | ICD-10-CM

## 2022-09-19 DIAGNOSIS — K279 Peptic ulcer, site unspecified, unspecified as acute or chronic, without hemorrhage or perforation: Secondary | ICD-10-CM

## 2022-09-19 DIAGNOSIS — K7031 Alcoholic cirrhosis of liver with ascites: Secondary | ICD-10-CM

## 2022-09-19 DIAGNOSIS — I161 Hypertensive emergency: Secondary | ICD-10-CM

## 2022-09-19 DIAGNOSIS — R531 Weakness: Secondary | ICD-10-CM

## 2022-09-19 DIAGNOSIS — E1165 Type 2 diabetes mellitus with hyperglycemia: Secondary | ICD-10-CM

## 2022-09-19 LAB — GLUCOSE, CAPILLARY
Glucose-Capillary: 95 mg/dL (ref 70–99)
Glucose-Capillary: 99 mg/dL (ref 70–99)
Glucose-Capillary: 76 mg/dL (ref 70–99)
Glucose-Capillary: 113 mg/dL — ABNORMAL HIGH (ref 70–99)
Glucose-Capillary: 94 mg/dL (ref 70–99)
Glucose-Capillary: 193 mg/dL — ABNORMAL HIGH (ref 70–99)

## 2022-09-19 LAB — ANCA PROFILE
Anti-MPO Antibodies: <0.2 units (ref 0.0–0.9)
Anti-PR3 Antibodies: <0.2 units (ref 0.0–0.9)
Atypical P-ANCA titer: <1:20 {titer}
C-ANCA: <1:20 {titer}
P-ANCA: <1:20 {titer}

## 2022-09-19 LAB — C4 COMPLEMENT: Complement C4, Body Fluid: 17 mg/dL (ref 12–38)

## 2022-09-19 LAB — C3 COMPLEMENT: C3 Complement: 127 mg/dL (ref 82–167)

## 2022-09-19 MED ORDER — CALCIUM CARBONATE ANTACID 500 MG PO CHEW
1.0000 | CHEWABLE_TABLET | Freq: Once | ORAL | Status: AC
  Administered 2022-09-20: 200 mg via ORAL
  Filled 2022-09-19: qty 1

## 2022-09-19 MED ORDER — LACTULOSE 10 GM/15ML PO SOLN
20.0000 g | Freq: Every day | ORAL | Status: DC
  Filled 2022-09-19 (×2): qty 30

## 2022-09-19 MED ORDER — HYDROXYZINE HCL 10 MG PO TABS
10.0000 mg | ORAL_TABLET | Freq: Three times a day (TID) | ORAL | Status: DC | PRN
  Administered 2022-09-19 – 2022-09-20 (×3): 10 mg via ORAL
  Filled 2022-09-19 (×4): qty 1

## 2022-09-19 MED ORDER — EPOETIN ALFA 4000 UNIT/ML IJ SOLN
INTRAMUSCULAR | Status: AC
  Filled 2022-09-19: qty 1

## 2022-09-19 NOTE — Assessment & Plan Note (Addendum)
Will go to the hospice home.

## 2022-09-19 NOTE — Assessment & Plan Note (Signed)
Completed 7 days of antibiotic.

## 2022-09-19 NOTE — Assessment & Plan Note (Addendum)
Last hemoglobin 9.9 

## 2022-09-19 NOTE — Progress Notes (Signed)
Progress Note   Patient: Jerry [name] UXN:[id number] DOB: 1966/02/01 DOA: 09/11/2022     8 DOS: the patient was seen and examined on 09/19/2022   Brief hospital course: 56 year old Spanish-speaking male with past medical history of end-stage renal disease on hemodialysis and hypertension hospitalized 2 months ago for acute GI bleed from gastric ulcers requiring 3 units packed red blood cells who presented to the emergency room on 11/27 afternoon from dialysis with shortness of breath.  Patient had completed all previous sessions of dialysis.  Workup revealed mild hypoxia and underlying multifocal airway disease and elevated procalcitonin consistent with pneumonia as well as hypertensive urgency with systolic blood pressure of 170's.  Patient admitted to the hospitalist service.  By 11/29, patient with little improvement.  Follow-up procalcitonin that day had increased to 4.74 (previously 2.3) and Rocephin/Zithromax changed to cefepime.  Although only receiving 1 dose, procalcitonin up to 8.3 by following day and CT scan of chest ordered.  Assessment and Plan: Multifocal pneumonia Completed 7 days of antibiotic.  Type 2 diabetes mellitus with hyperglycemia (HCC) On Semglee insulin and sliding scale.  ESRD on hemodialysis [facility]) Patient had dialysis today.  Acute on chronic combined systolic and diastolic CHF (congestive heart failure) (HCC) Last EF 40 to 45%.  Dialysis to remove fluid.  Hypertensive emergency Elevated blood pressure on arrival.  Weakness We will get PT reevaluation.  Check orthostatic vital signs.  Anemia of chronic disease Last hemoglobin 8.5  Alcoholic cirrhosis of liver with ascites (HCC) We will check an ammonia level tomorrow.  As needed Atarax for itching.  PUD (peptic ulcer disease) History of upper GI bleed 07/2022 EGD on 10/9 showed nonbleeding gastric ulcers and gastritis Continue Protonix        Subjective: Patient states he can only  walk 4 feet prior to coming into the hospital.  Feels very weak.  Feels like he chokes on liquids and solids.  Complains of some back pain.  Complains of itching.  Complains of trouble having a bowel movement.  But he said he felt good when I first started talking to him.  Physical Exam: Vitals:   09/19/22 1257 09/19/22 1300 09/19/22 1305 09/19/22 1346  BP: (!) 167/77 (!) 173/78  (!) 168/69  Pulse: 75 76  78  Resp: 10 12  16   Temp: 98 F (36.7 C)     TempSrc: Oral     SpO2: 95% 96%  93%  Weight:   75.8 kg   Height:       Physical Exam HENT:     Head: Normocephalic.     Mouth/Throat:     Pharynx: No oropharyngeal exudate.  Eyes:     General: Lids are normal.     Conjunctiva/sclera: Conjunctivae normal.  Cardiovascular:     Rate and Rhythm: Normal rate and regular rhythm.     Heart sounds: Normal heart sounds, S1 normal and S2 normal.  Pulmonary:     Breath sounds: Examination of the right-lower field reveals decreased breath sounds. Examination of the left-lower field reveals decreased breath sounds. Decreased breath sounds present. No wheezing, rhonchi or rales.  Abdominal:     Palpations: Abdomen is soft.     Tenderness: There is no abdominal tenderness.  Musculoskeletal:     Right lower leg: No swelling.  Skin:    General: Skin is warm.     Findings: No rash.  Neurological:     Mental Status: He is alert.     Data Reviewed: Sodium 126, creatinine  10, total bilirubin 3.3, alkaline phosphatase 306, hemoglobin 8.5  Family Communication: Spoke with family at the bedside  Disposition: Status is: Inpatient Remains inpatient appropriate because: Patient will have a palliative meeting tomorrow.  Planned Discharge Destination: Home    Time spent: 28 minutes  Author: Loletha Grayer, MD 09/19/2022 3:15 PM  For on call review [email or web address].

## 2022-09-19 NOTE — Progress Notes (Signed)
       CROSS COVER NOTE  NAME: Jerry [name] MRN: [id number] DOB : 1965-12-10 ATTENDING PHYSICIAN: Loletha Grayer, MD    Date of Service   09/19/2022   HPI/Events of Note   Message received from RN reporting Jerry [name] is declining to wear his oxygen and telemetry. RN reports he states he would like to stop all treatment.   During my conversation with Jerry [name] he asked me to clarify hospice vs comfort care. I explained that if he opted for comfort care we would stop all treatments and therapy that does not focus on his comfort at end of life. We typically start routine pain meds and expect patients to pass in hours to days in the hospital. I explained hospice is care in your home that also focuses on comfort and symptom management. We typically expect patients to pass in weeks to months in their home.   His most significant question surrounded End of Life care and his options based on his insurance.  Interventions   Assessment/Plan:  Continue with DNR Patient would like to discuss his options more with [facility] Palliative today.    This document was prepared using Dragon voice recognition software and may include unintentional dictation errors.  Neomia Glass DNP, MBA, FNP-BC Nurse Practitioner [facility] Pager [phone]

## 2022-09-19 NOTE — Assessment & Plan Note (Addendum)
Will discontinue insulin and sliding scale for at this time since pursuing comfort measures.

## 2022-09-19 NOTE — Assessment & Plan Note (Signed)
We will check an ammonia level tomorrow.  As needed Atarax for itching.

## 2022-09-19 NOTE — Progress Notes (Signed)
Patient ID: [name], male   DOB: [birth year], 56 y.o.   MRN: [id number]    Progress Note from the Palliative Medicine Team at [facility]   Patient Name: Jerry [name]        Date: 09/19/2022 DOB: 19-Apr-1966  Age: 56 y.o. MRN#: [id number] Attending Physician: Loletha Grayer, MD Primary Care Physician: Freddy Finner, NP Admit Date: 09/11/2022   Medical records reviewed, assessed patient and discussed with treatment team    56 y.o. male  with past medical history of ESRD (HD), recent hospitalization (2 months ago) for GI bleed with gastric ulcers and HTN admitted on 09/11/2022 with shortness of breath status post dialysis treatment.   Patient was being treated for multifocal airway disease.  Procalcitonin continued to elevate as well as hypertensive urgency with systolic blood pressures in the 170s.   On 12/1, CT scan revealed interstitial lung disease, severe cardiomegaly with fluid overload, and radiographic findings of ESLD/cirrhosis.   Given patient's multiorgan failure and overall poor prognosis, PMC was consulted to discuss goals of care.     This NP assessed patient at the bedside as a follow up for palliative medicine needs and emotional support, and to meet with family as scheduled for goals of care meeting.  No family at bedside, patient is lethargic .    This nurse practitioner attempted to explore patient and family decisions regarding treatment plan.  Daughter/ Constance Holster and Daughter/Sorbetta  [phone] on conference call by phone.  Again all family members verbalize a clear understanding of the seriousness of the patient's current medical situation and the reality that patient is running out of options to continue life-prolonging measures.    All agree that patient cannot return home and expect to continue with outpatient dialysis.    Education offered on the difference between aggressive medical intervention path and a palliative comfort path for  this patient, at this time and in this situation.  Education offered on hospice benefit; philosophy and eligibility  Education offered on the natural trajectory at end-of-life when patient makes decision to stop dialysis.  Prognosis being  less than 2 weeks.   Plan of care - DNR/DNI -Continue current treatment plan until family have more time--patient and family are leaning towards a comfort path foregoing dialysis -Meeting scheduled for tomorrow/09-20-22  at 0900 with family   Family plan to take the next 24 hours to continue today's conversation as patient makes decisions regarding the next steps in his treatment plan.  This nurse practitioner will follow-up in the morning  Questions and concerns addressed   Discussed with Dr Earleen Newport and nursing team via secure chat  35  minutes-greater than 50% of the time was spent in counseling and coordination of care.   Wadie Lessen NP  Palliative Medicine Team Team Phone # [phone] Pager [phone]

## 2022-09-19 NOTE — Progress Notes (Signed)
Pt is refusing to wear oxygen and telemetry. He states he no longer wants to live or receive treatment. Emotional support was offered. On call provider notified and stated she would come and speak with patient. Wife is at bedside. Pt received water as requested.

## 2022-09-19 NOTE — Progress Notes (Signed)
Pt tolerated and completed tx without difficulty.  Pt ran for 3.5hrs and removed 3.5KGS fluid.  BVP 84.0L. post b/p 173/78.  Report given to floor RN.  Awaiting trasport.

## 2022-09-19 NOTE — Progress Notes (Signed)
[facility]  ROUNDING NOTE   Subjective:   Jerry [name] is a 56 year old Spanish-speaking male with past medical conditions including hypertension, chronic back pain, diabetes, and end-stage renal disease on hemodialysis.  Patient presents to the emergency department from his dialysis center with shortness of breath and weakness.  Patient has been admitted for Acute pulmonary edema (HCC) [J81.0] Pneumonia [J18.9] Acute respiratory failure with hypoxia ([place]) [J96.01] Fluid overload [E87.70]  Patient is known to our practice and receives outpatient dialysis treatments at [facility] on a MWF schedule, supervised by Dr. Candiss Norse.   Patient seen and evaluated during dialysis   HEMODIALYSIS FLOWSHEET:  Blood Flow Rate (mL/min): 400 mL/min Arterial Pressure (mmHg): -140 mmHg Venous Pressure (mmHg): 260 mmHg TMP (mmHg): 16 mmHg Ultrafiltration Rate (mL/min): 1174 mL/min Dialysate Flow Rate (mL/min): 300 ml/min  Tolerating treatment well Denies any pain or discomfort at this time   Objective:  Vital signs in last 24 hours:  Temp:  [97.6 F (36.4 C)-98.8 F (37.1 C)] 98 F (36.7 C) (12/06 1257) Pulse Rate:  [69-79] 78 (12/06 1346) Resp:  [9-20] 16 (12/06 1346) BP: (119-173)/(64-112) 168/69 (12/06 1346) SpO2:  [87 %-98 %] 93 % (12/06 1346) Weight:  [75.8 kg-78.3 kg] 75.8 kg (12/06 1305)  Weight change:  Filed Weights   09/18/22 0500 09/19/22 0904 09/19/22 1305  Weight: 80.1 kg 78.3 kg 75.8 kg    Intake/Output: I/O last 3 completed shifts: In: -  Out: 1 [Stool:1]   Intake/Output this shift:  Total I/O In: 342.6 [IV Piggyback:342.6] Out: 3501 [Other:3500; Stool:1]  Physical Exam: General: NAD  Head: Normocephalic, atraumatic. Dry oral mucosal membranes  Eyes: Anicteric  Lungs:  Bilateral basilar crackles, normal effort,  O2  Heart: Regular rate and rhythm  Abdomen:  Soft, nontender, nondistended  Extremities: 1+ peripheral edema.   Neurologic: Alert and oriented  Skin: No lesions  Access: Left aVF    Basic Metabolic Panel: Recent Labs  Lab 09/14/22 0521 09/15/22 0729 09/16/22 0751 09/17/22 1452  NA 129* 129* 127* 126*  K 4.8 4.4 5.0 4.8  CL 91* 91* 88* 86*  CO2 20* 23 21* 23  GLUCOSE 109* 300* 284* 184*  BUN 91* 70* 92* 126*  CREATININE 9.41* 7.13* 9.03* 10.10*  CALCIUM 9.0 9.2 9.6 9.0  PHOS  --   --   --  8.8*     Liver Function Tests: Recent Labs  Lab 09/17/22 1452 09/18/22 0011  AST  --  35  ALT  --  19  ALKPHOS  --  306*  BILITOT  --  3.3*  PROT  --  8.2*  ALBUMIN 2.7* 2.6*    No results for input(s): "LIPASE", "AMYLASE" in the last 168 hours. No results for input(s): "AMMONIA" in the last 168 hours.  CBC: Recent Labs  Lab 09/14/22 0521 09/15/22 0729 09/16/22 0751 09/17/22 1452  WBC 6.4 4.9 8.1 8.5  NEUTROABS  --  4.6 7.6  --   HGB 8.6* 9.1* 9.1* 8.5*  HCT 24.8* 26.1* 25.9* 24.0*  MCV 96.9 96.3 94.9 93.0  PLT 147* 155 136* 138*     Cardiac Enzymes: No results for input(s): "CKTOTAL", "CKMB", "CKMBINDEX", "TROPONINI" in the last 168 hours.  BNP: Invalid input(s): "POCBNP"  CBG: Recent Labs  Lab 09/18/22 1700 09/18/22 2115 09/19/22 0741 09/19/22 0836 09/19/22 1342  GLUCAP 117* 131* 95 99 76     Microbiology: Results for orders placed or performed during the hospital encounter of 09/11/22  Resp Panel by RT-PCR (  Flu A&B, Covid) Anterior Nasal Swab     Status: None   Collection Time: 09/11/22 12:31 AM   Specimen: Anterior Nasal Swab  Result Value Ref Range Status   SARS Coronavirus 2 by RT PCR NEGATIVE NEGATIVE Final    Comment: (NOTE) SARS-CoV-2 target nucleic acids are NOT DETECTED.  The SARS-CoV-2 RNA is generally detectable in upper respiratory specimens during the acute phase of infection. The lowest concentration of SARS-CoV-2 viral copies this assay can detect is 138 copies/mL. A negative result does not preclude SARS-Cov-2 infection and should not  be used as the sole basis for treatment or other patient management decisions. A negative result may occur with  improper specimen collection/handling, submission of specimen other than nasopharyngeal swab, presence of viral mutation(s) within the areas targeted by this assay, and inadequate number of viral copies(<138 copies/mL). A negative result must be combined with clinical observations, patient history, and epidemiological information. The expected result is Negative.  Fact Sheet for Patients:  EntrepreneurPulse.com.au  Fact Sheet for Healthcare Providers:  IncredibleEmployment.be  This test is no t yet approved or cleared by the [place] FDA and  has been authorized for detection and/or diagnosis of SARS-CoV-2 by FDA under an Emergency Use Authorization (EUA). This EUA will remain  in effect (meaning this test can be used) for the duration of the COVID-19 declaration under Section 564(b)(1) of the Act, 21 U.S.C.section 360bbb-3(b)(1), unless the authorization is terminated  or revoked sooner.       Influenza A by PCR NEGATIVE NEGATIVE Final   Influenza B by PCR NEGATIVE NEGATIVE Final    Comment: (NOTE) The Xpert Xpress SARS-CoV-2/FLU/RSV plus assay is intended as an aid in the diagnosis of influenza from Nasopharyngeal swab specimens and should not be used as a sole basis for treatment. Nasal washings and aspirates are unacceptable for Xpert Xpress SARS-CoV-2/FLU/RSV testing.  Fact Sheet for Patients: EntrepreneurPulse.com.au  Fact Sheet for Healthcare Providers: IncredibleEmployment.be  This test is not yet approved or cleared by the [place] FDA and has been authorized for detection and/or diagnosis of SARS-CoV-2 by FDA under an Emergency Use Authorization (EUA). This EUA will remain in effect (meaning this test can be used) for the duration of the COVID-19 declaration under Section  564(b)(1) of the Act, 21 U.S.C. section 360bbb-3(b)(1), unless the authorization is terminated or revoked.  Performed at [facility], [address] [place], [place] [address]   Culture, blood (routine x 2) Call MD if unable to obtain prior to antibiotics being given     Status: None   Collection Time: 09/11/22  5:22 AM   Specimen: BLOOD  Result Value Ref Range Status   Specimen Description BLOOD RIGHT FA  Final   Special Requests   Final    BOTTLES DRAWN AEROBIC AND ANAEROBIC Blood Culture results may not be optimal due to an inadequate volume of blood received in culture bottles   Culture   Final    NO GROWTH 5 DAYS Performed at [facility], [address] [place], [place] [address]    Report Status 09/16/2022 FINAL  Final  Culture, blood (routine x 2) Call MD if unable to obtain prior to antibiotics being given     Status: None   Collection Time: 09/12/22  6:07 AM   Specimen: BLOOD RIGHT HAND  Result Value Ref Range Status   Specimen Description BLOOD RIGHT HAND  Final   Special Requests   Final    BOTTLES DRAWN AEROBIC AND ANAEROBIC  Blood Culture adequate volume   Culture   Final    NO GROWTH 5 DAYS Performed at [facility], [address] [place], [place] [address]    Report Status 09/17/2022 FINAL  Final  Surgical pcr screen     Status: None   Collection Time: 09/14/22  4:30 PM   Specimen: Nasal Mucosa; Nasal Swab  Result Value Ref Range Status   MRSA, PCR NEGATIVE NEGATIVE Final   Staphylococcus aureus NEGATIVE NEGATIVE Final    Comment: (NOTE) The Xpert SA Assay (FDA approved for NASAL specimens in patients 65 years of age and older), is one component of a comprehensive surveillance program. It is not intended to diagnose infection nor to guide or monitor treatment. Performed at [facility], [place], [place] [address]   Respiratory (~20 pathogens) panel by PCR     Status: None   Collection Time:  09/15/22 10:12 AM   Specimen: Nasopharyngeal Swab; Respiratory  Result Value Ref Range Status   Adenovirus NOT DETECTED NOT DETECTED Final   Coronavirus 229E NOT DETECTED NOT DETECTED Final    Comment: (NOTE) The Coronavirus on the Respiratory Panel, DOES NOT test for the novel  Coronavirus (2019 nCoV)    Coronavirus HKU1 NOT DETECTED NOT DETECTED Final   Coronavirus NL63 NOT DETECTED NOT DETECTED Final   Coronavirus OC43 NOT DETECTED NOT DETECTED Final   Metapneumovirus NOT DETECTED NOT DETECTED Final   Rhinovirus / Enterovirus NOT DETECTED NOT DETECTED Final   Influenza A NOT DETECTED NOT DETECTED Final   Influenza B NOT DETECTED NOT DETECTED Final   Parainfluenza Virus 1 NOT DETECTED NOT DETECTED Final   Parainfluenza Virus 2 NOT DETECTED NOT DETECTED Final   Parainfluenza Virus 3 NOT DETECTED NOT DETECTED Final   Parainfluenza Virus 4 NOT DETECTED NOT DETECTED Final   Respiratory Syncytial Virus NOT DETECTED NOT DETECTED Final   Bordetella pertussis NOT DETECTED NOT DETECTED Final   Bordetella Parapertussis NOT DETECTED NOT DETECTED Final   Chlamydophila pneumoniae NOT DETECTED NOT DETECTED Final   Mycoplasma pneumoniae NOT DETECTED NOT DETECTED Final    Comment: Performed at [facility], [place]. [address], [place], [place] [address]    Coagulation Studies: No results for input(s): "LABPROT", "INR" in the last 72 hours.  Urinalysis: No results for input(s): "COLORURINE", "LABSPEC", "PHURINE", "GLUCOSEU", "HGBUR", "BILIRUBINUR", "KETONESUR", "PROTEINUR", "UROBILINOGEN", "NITRITE", "LEUKOCYTESUR" in the last 72 hours.  Invalid input(s): "APPERANCEUR"    Imaging: No results found.   Medications:    anticoagulant sodium citrate      calcium carbonate  1 tablet Oral TID WC   Chlorhexidine Gluconate Cloth  6 each Topical Q0600   epoetin alfa       epoetin (EPOGEN/PROCRIT) injection  4,000 Units Intravenous Q M,W,F-HD   feeding supplement (NEPRO CARB STEADY)  237 mL  Oral BID BM   gabapentin  100 mg Oral Q1200   guaiFENesin  600 mg Oral BID   heparin  5,000 Units Subcutaneous Q8H   insulin aspart  0-5 Units Subcutaneous QHS   insulin aspart  0-6 Units Subcutaneous TID WC   insulin aspart  2 Units Subcutaneous TID WC   insulin glargine-yfgn  12 Units Subcutaneous Daily   lactulose  20 g Oral Daily   lidocaine  1 patch Transdermal Q24H   methocarbamol  750 mg Oral QID   polyethylene glycol  17 g Oral Daily   predniSONE  40 mg Oral Q breakfast   sevelamer carbonate  1,600 mg Oral TID WC   acetaminophen **OR** acetaminophen, albuterol, alteplase, alum & mag hydroxide-simeth, anticoagulant sodium citrate, epoetin alfa, heparin, hydrALAZINE, HYDROmorphone (DILAUDID) injection, hydrOXYzine, ipratropium-albuterol, lidocaine (PF), lidocaine-prilocaine, ondansetron **OR** ondansetron (ZOFRAN) IV, mouth rinse, oxyCODONE, pentafluoroprop-tetrafluoroeth  Assessment/ Plan:  Jerry [name] is a 56 y.o.  male with past medical conditions including hypertension, chronic back pain, diabetes, and end-stage renal disease on hemodialysis.  Patient presents to the emergency department from his dialysis center with shortness of breath and weakness.  Patient has been admitted for Acute pulmonary edema (HCC) [J81.0] Pneumonia [J18.9] Acute respiratory failure with hypoxia (HCC) [J96.01] Fluid overload [E87.70]  [facility]/MWF/left aVF  End-stage renal disease on hemodialysis.  Dialysis received today, UF goal 3 to 3.5 L as tolerated.  Next treatment scheduled for Friday.  2. Anemia of chronic kidney disease Lab Results  Component Value Date   HGB 8.5 (L) 09/17/2022    Patient receives Mircera at outpatient clinic.  Hemoglobin below desired target.  Will consider EPO with dialysis treatments.  3. Secondary Hyperparathyroidism: with outpatient labs: PTH 818, phosphorus 9.1, calcium 8.9 on 08/27/22.   Lab Results  Component Value Date    CALCIUM 9.0 09/17/2022   PHOS 8.8 (H) 09/17/2022    Calcium within desired goal.  Phosphorus remains elevated, continue sevelamer with meals.  4.  Community-acquired pneumonia.  Chest x-ray suspicious for multifocal pneumonia.  Respiratory panel negative.  Currently receiving cefepime per primary team. CT chest shows interstitial disease, cannot exclude superimposed pulmonary edema or multifocal infection.  Palliative care following patient to establish goals of care.  Patient now DNR.  Family meeting scheduled for tomorrow.   LOS: 8 [place] 12/6/20233:46 PM

## 2022-09-19 NOTE — Assessment & Plan Note (Addendum)
Last EF 40 to 45%.

## 2022-09-20 ENCOUNTER — Inpatient Hospital Stay: Payer: Medicaid Other

## 2022-09-20 DIAGNOSIS — R188 Other ascites: Secondary | ICD-10-CM

## 2022-09-20 DIAGNOSIS — J849 Interstitial pulmonary disease, unspecified: Secondary | ICD-10-CM

## 2022-09-20 DIAGNOSIS — K7460 Unspecified cirrhosis of liver: Secondary | ICD-10-CM

## 2022-09-20 DIAGNOSIS — J9601 Acute respiratory failure with hypoxia: Secondary | ICD-10-CM

## 2022-09-20 DIAGNOSIS — I951 Orthostatic hypotension: Secondary | ICD-10-CM

## 2022-09-20 LAB — COMPREHENSIVE METABOLIC PANEL
ALT: 77 U/L — ABNORMAL HIGH (ref 0–44)
AST: 107 U/L — ABNORMAL HIGH (ref 15–41)
Albumin: 2.4 g/dL — ABNORMAL LOW (ref 3.5–5.0)
Alkaline Phosphatase: 263 U/L — ABNORMAL HIGH (ref 38–126)
Anion gap: 12 (ref 5–15)
BUN: 81 mg/dL — ABNORMAL HIGH (ref 6–20)
CO2: 25 mmol/L (ref 22–32)
Calcium: 8.7 mg/dL — ABNORMAL LOW (ref 8.9–10.3)
Chloride: 95 mmol/L — ABNORMAL LOW (ref 98–111)
Creatinine, Ser: 6.08 mg/dL — ABNORMAL HIGH (ref 0.61–1.24)
GFR, Estimated: 10 mL/min — ABNORMAL LOW (ref >60)
Glucose, Bld: 188 mg/dL — ABNORMAL HIGH (ref 70–99)
Potassium: 4.9 mmol/L (ref 3.5–5.1)
Sodium: 132 mmol/L — ABNORMAL LOW (ref 135–145)
Total Bilirubin: 3.8 mg/dL — ABNORMAL HIGH (ref 0.3–1.2)
Total Protein: 7.2 g/dL (ref 6.5–8.1)

## 2022-09-20 LAB — HEMOGLOBIN: Hemoglobin: 10.6 g/dL — ABNORMAL LOW (ref 13.0–17.0)

## 2022-09-20 LAB — GLUCOSE, CAPILLARY
Glucose-Capillary: 181 mg/dL — ABNORMAL HIGH (ref 70–99)
Glucose-Capillary: 150 mg/dL — ABNORMAL HIGH (ref 70–99)
Glucose-Capillary: 114 mg/dL — ABNORMAL HIGH (ref 70–99)
Glucose-Capillary: 165 mg/dL — ABNORMAL HIGH (ref 70–99)
Glucose-Capillary: 151 mg/dL — ABNORMAL HIGH (ref 70–99)

## 2022-09-20 LAB — AMMONIA: Ammonia: 35 umol/L (ref 9–35)

## 2022-09-20 MED ORDER — HYDROXYZINE HCL 25 MG PO TABS
25.0000 mg | ORAL_TABLET | Freq: Three times a day (TID) | ORAL | Status: DC | PRN
  Administered 2022-09-20 – 2022-09-23 (×8): 25 mg via ORAL
  Filled 2022-09-20 (×9): qty 1

## 2022-09-20 MED ORDER — CAMPHOR-MENTHOL 0.5-0.5 % EX LOTN
TOPICAL_LOTION | CUTANEOUS | Status: DC | PRN
  Administered 2022-09-20: 1 via TOPICAL
  Filled 2022-09-20: qty 222

## 2022-09-20 MED ORDER — MIDODRINE HCL 5 MG PO TABS
5.0000 mg | ORAL_TABLET | Freq: Three times a day (TID) | ORAL | Status: DC
  Administered 2022-09-20 (×2): 5 mg via ORAL
  Filled 2022-09-20 (×3): qty 1

## 2022-09-20 NOTE — Progress Notes (Signed)
[facility]  ROUNDING NOTE   Subjective:   Jerry [name] is a 56 year old Spanish-speaking male with past medical conditions including hypertension, chronic back pain, diabetes, and end-stage renal disease on hemodialysis.  Patient presents to the emergency department from his dialysis center with shortness of breath and weakness.  Patient has been admitted for Acute pulmonary edema (HCC) [J81.0] Pneumonia [J18.9] Acute respiratory failure with hypoxia ([place]) [J96.01] Fluid overload [E87.70]  Patient is known to our practice and receives outpatient dialysis treatments at [facility] on a MWF schedule, supervised by Dr. Candiss Norse.   Patient seen resting in bed, family at bedside Tolerating meals without nausea and vomiting.  Complains of generalized pain and discomfort   Objective:  Vital signs in last 24 hours:  Temp:  [97.5 F (36.4 C)-98 F (36.7 C)] 98 F (36.7 C) (12/07 1551) Pulse Rate:  [64-78] 67 (12/07 1551) Resp:  [8-18] 14 (12/07 1625) BP: (115-162)/(59-79) 115/59 (12/07 1551) SpO2:  [69 %-97 %] 96 % (12/07 1625)  Weight change:  Filed Weights   09/18/22 0500 09/19/22 0904 09/19/22 1305  Weight: 80.1 kg 78.3 kg 75.8 kg    Intake/Output: I/O last 3 completed shifts: In: 342.6 [IV Piggyback:342.6] Out: 3502 [Other:3500; Stool:2]   Intake/Output this shift:  No intake/output data recorded.  Physical Exam: General: NAD  Head: Normocephalic, atraumatic. Dry oral mucosal membranes  Eyes: Anicteric  Lungs:  Fine crackles, normal effort,  O2  Heart: Regular rate and rhythm  Abdomen:  Soft, nontender, nondistended  Extremities: 1+ peripheral edema.  Neurologic: Alert and oriented  Skin: No lesions  Access: Left aVF    Basic Metabolic Panel: Recent Labs  Lab 09/14/22 0521 09/15/22 0729 09/16/22 0751 09/17/22 1452 09/20/22 0501  NA 129* 129* 127* 126* 132*  K 4.8 4.4 5.0 4.8 4.9  CL 91* 91* 88* 86* 95*  CO2 20* 23 21* 23  25  GLUCOSE 109* 300* 284* 184* 188*  BUN 91* 70* 92* 126* 81*  CREATININE 9.41* 7.13* 9.03* 10.10* 6.08*  CALCIUM 9.0 9.2 9.6 9.0 8.7*  PHOS  --   --   --  8.8*  --      Liver Function Tests: Recent Labs  Lab 09/17/22 1452 09/18/22 0011 09/20/22 0501  AST  --  35 107*  ALT  --  19 77*  ALKPHOS  --  306* 263*  BILITOT  --  3.3* 3.8*  PROT  --  8.2* 7.2  ALBUMIN 2.7* 2.6* 2.4*    No results for input(s): "LIPASE", "AMYLASE" in the last 168 hours. Recent Labs  Lab 09/20/22 0501  AMMONIA 35    CBC: Recent Labs  Lab 09/14/22 0521 09/15/22 0729 09/16/22 0751 09/17/22 1452 09/20/22 0501  WBC 6.4 4.9 8.1 8.5  --   NEUTROABS  --  4.6 7.6  --   --   HGB 8.6* 9.1* 9.1* 8.5* 10.6*  HCT 24.8* 26.1* 25.9* 24.0*  --   MCV 96.9 96.3 94.9 93.0  --   PLT 147* 155 136* 138*  --      Cardiac Enzymes: No results for input(s): "CKTOTAL", "CKMB", "CKMBINDEX", "TROPONINI" in the last 168 hours.  BNP: Invalid input(s): "POCBNP"  CBG: Recent Labs  Lab 09/19/22 2120 09/20/22 0449 09/20/22 0826 09/20/22 1202 09/20/22 1627  GLUCAP 193* 181* 150* 114* 165*     Microbiology: Results for orders placed or performed during the hospital encounter of 09/11/22  Resp Panel by RT-PCR (Flu A&B, Covid) Anterior Nasal Swab  Status: None   Collection Time: 09/11/22 12:31 AM   Specimen: Anterior Nasal Swab  Result Value Ref Range Status   SARS Coronavirus 2 by RT PCR NEGATIVE NEGATIVE Final    Comment: (NOTE) SARS-CoV-2 target nucleic acids are NOT DETECTED.  The SARS-CoV-2 RNA is generally detectable in upper respiratory specimens during the acute phase of infection. The lowest concentration of SARS-CoV-2 viral copies this assay can detect is 138 copies/mL. A negative result does not preclude SARS-Cov-2 infection and should not be used as the sole basis for treatment or other patient management decisions. A negative result may occur with  improper specimen  collection/handling, submission of specimen other than nasopharyngeal swab, presence of viral mutation(s) within the areas targeted by this assay, and inadequate number of viral copies(<138 copies/mL). A negative result must be combined with clinical observations, patient history, and epidemiological information. The expected result is Negative.  Fact Sheet for Patients:  EntrepreneurPulse.com.au  Fact Sheet for Healthcare Providers:  IncredibleEmployment.be  This test is no t yet approved or cleared by the [place] FDA and  has been authorized for detection and/or diagnosis of SARS-CoV-2 by FDA under an Emergency Use Authorization (EUA). This EUA will remain  in effect (meaning this test can be used) for the duration of the COVID-19 declaration under Section 564(b)(1) of the Act, 21 U.S.C.section 360bbb-3(b)(1), unless the authorization is terminated  or revoked sooner.       Influenza A by PCR NEGATIVE NEGATIVE Final   Influenza B by PCR NEGATIVE NEGATIVE Final    Comment: (NOTE) The Xpert Xpress SARS-CoV-2/FLU/RSV plus assay is intended as an aid in the diagnosis of influenza from Nasopharyngeal swab specimens and should not be used as a sole basis for treatment. Nasal washings and aspirates are unacceptable for Xpert Xpress SARS-CoV-2/FLU/RSV testing.  Fact Sheet for Patients: EntrepreneurPulse.com.au  Fact Sheet for Healthcare Providers: IncredibleEmployment.be  This test is not yet approved or cleared by the [place] FDA and has been authorized for detection and/or diagnosis of SARS-CoV-2 by FDA under an Emergency Use Authorization (EUA). This EUA will remain in effect (meaning this test can be used) for the duration of the COVID-19 declaration under Section 564(b)(1) of the Act, 21 U.S.C. section 360bbb-3(b)(1), unless the authorization is terminated or revoked.  Performed at [facility], [address] [place], [place] [address]   Culture, blood (routine x 2) Call MD if unable to obtain prior to antibiotics being given     Status: None   Collection Time: 09/11/22  5:22 AM   Specimen: BLOOD  Result Value Ref Range Status   Specimen Description BLOOD RIGHT FA  Final   Special Requests   Final    BOTTLES DRAWN AEROBIC AND ANAEROBIC Blood Culture results may not be optimal due to an inadequate volume of blood received in culture bottles   Culture   Final    NO GROWTH 5 DAYS Performed at [facility], [address] [place], [place] [address]    Report Status 09/16/2022 FINAL  Final  Culture, blood (routine x 2) Call MD if unable to obtain prior to antibiotics being given     Status: None   Collection Time: 09/12/22  6:07 AM   Specimen: BLOOD RIGHT HAND  Result Value Ref Range Status   Specimen Description BLOOD RIGHT HAND  Final   Special Requests   Final    BOTTLES DRAWN AEROBIC AND ANAEROBIC Blood Culture adequate volume   Culture   Final  NO GROWTH 5 DAYS Performed at [facility], [address] [place], [place] [address]    Report Status 09/17/2022 FINAL  Final  Surgical pcr screen     Status: None   Collection Time: 09/14/22  4:30 PM   Specimen: Nasal Mucosa; Nasal Swab  Result Value Ref Range Status   MRSA, PCR NEGATIVE NEGATIVE Final   Staphylococcus aureus NEGATIVE NEGATIVE Final    Comment: (NOTE) The Xpert SA Assay (FDA approved for NASAL specimens in patients 72 years of age and older), is one component of a comprehensive surveillance program. It is not intended to diagnose infection nor to guide or monitor treatment. Performed at [facility], [place], [place] [address]   Respiratory (~20 pathogens) panel by PCR     Status: None   Collection Time: 09/15/22 10:12 AM   Specimen: Nasopharyngeal Swab; Respiratory  Result Value Ref Range Status   Adenovirus NOT DETECTED NOT DETECTED  Final   Coronavirus 229E NOT DETECTED NOT DETECTED Final    Comment: (NOTE) The Coronavirus on the Respiratory Panel, DOES NOT test for the novel  Coronavirus (2019 nCoV)    Coronavirus HKU1 NOT DETECTED NOT DETECTED Final   Coronavirus NL63 NOT DETECTED NOT DETECTED Final   Coronavirus OC43 NOT DETECTED NOT DETECTED Final   Metapneumovirus NOT DETECTED NOT DETECTED Final   Rhinovirus / Enterovirus NOT DETECTED NOT DETECTED Final   Influenza A NOT DETECTED NOT DETECTED Final   Influenza B NOT DETECTED NOT DETECTED Final   Parainfluenza Virus 1 NOT DETECTED NOT DETECTED Final   Parainfluenza Virus 2 NOT DETECTED NOT DETECTED Final   Parainfluenza Virus 3 NOT DETECTED NOT DETECTED Final   Parainfluenza Virus 4 NOT DETECTED NOT DETECTED Final   Respiratory Syncytial Virus NOT DETECTED NOT DETECTED Final   Bordetella pertussis NOT DETECTED NOT DETECTED Final   Bordetella Parapertussis NOT DETECTED NOT DETECTED Final   Chlamydophila pneumoniae NOT DETECTED NOT DETECTED Final   Mycoplasma pneumoniae NOT DETECTED NOT DETECTED Final    Comment: Performed at [facility], [place]. [address] [place], [place] [address]    Coagulation Studies: No results for input(s): "LABPROT", "INR" in the last 72 hours.  Urinalysis: No results for input(s): "COLORURINE", "LABSPEC", "PHURINE", "GLUCOSEU", "HGBUR", "BILIRUBINUR", "KETONESUR", "PROTEINUR", "UROBILINOGEN", "NITRITE", "LEUKOCYTESUR" in the last 72 hours.  Invalid input(s): "APPERANCEUR"    Imaging: No results found.   Medications:    anticoagulant sodium citrate      calcium carbonate  1 tablet Oral TID WC   Chlorhexidine Gluconate Cloth  6 each Topical Q0600   epoetin (EPOGEN/PROCRIT) injection  4,000 Units Intravenous Q M,W,F-HD   feeding supplement (NEPRO CARB STEADY)  237 mL Oral BID BM   gabapentin  100 mg Oral Q1200   guaiFENesin  600 mg Oral BID   heparin  5,000 Units Subcutaneous Q8H   insulin aspart  0-5 Units  Subcutaneous QHS   insulin aspart  0-6 Units Subcutaneous TID WC   insulin aspart  2 Units Subcutaneous TID WC   insulin glargine-yfgn  12 Units Subcutaneous Daily   lactulose  20 g Oral Daily   lidocaine  1 patch Transdermal Q24H   methocarbamol  750 mg Oral QID   midodrine  5 mg Oral TID WC   polyethylene glycol  17 g Oral Daily   predniSONE  40 mg Oral Q breakfast   sevelamer carbonate  1,600 mg Oral TID WC   acetaminophen **OR** acetaminophen, albuterol, alteplase,  alum & mag hydroxide-simeth, anticoagulant sodium citrate, camphor-menthol, heparin, hydrALAZINE, HYDROmorphone (DILAUDID) injection, hydrOXYzine, ipratropium-albuterol, lidocaine (PF), lidocaine-prilocaine, ondansetron **OR** ondansetron (ZOFRAN) IV, mouth rinse, oxyCODONE, pentafluoroprop-tetrafluoroeth  Assessment/ Plan:  Mr. Rolland Steinert is a 56 y.o.  male with past medical conditions including hypertension, chronic back pain, diabetes, and end-stage renal disease on hemodialysis.  Patient presents to the emergency department from his dialysis center with shortness of breath and weakness.  Patient has been admitted for Acute pulmonary edema (HCC) [J81.0] Pneumonia [J18.9] Acute respiratory failure with hypoxia (HCC) [J96.01] Fluid overload [E87.70]  [facility]/MWF/left aVF  End-stage renal disease on hemodialysis.  Received dialysis yesterday, UF 3.5 L achieved.  Will perform dialysis tomorrow.  2. Anemia of chronic kidney disease Lab Results  Component Value Date   HGB 10.6 (L) 09/20/2022    Patient receives Mircera at outpatient clinic.  Hemoglobin acceptable at this time  3. Secondary Hyperparathyroidism: with outpatient labs: PTH 818, phosphorus 9.1, calcium 8.9 on 08/27/22.   Lab Results  Component Value Date   CALCIUM 8.7 (L) 09/20/2022   PHOS 8.8 (H) 09/17/2022    Monitoring bone minerals.  Phosphorus elevated.  Continue Renvela with meals.  4.  Community-acquired  pneumonia.  Chest x-ray suspicious for multifocal pneumonia.  Respiratory panel negative.  Currently receiving cefepime per primary team. CT chest shows interstitial disease, cannot exclude superimposed pulmonary edema or multifocal infection.  Palliative care following patient to establish goals of care.  Patient now DNR.  Family and patient request continuation of all current treatments.   LOS: 9 [place] 12/7/20234:57 PM

## 2022-09-20 NOTE — Progress Notes (Signed)
Progress Note   Patient: Jerry [name] YKZ:[id number] DOB: [birth year] DOA: 09/11/2022     9 DOS: the patient was seen and examined on 09/20/2022   Brief hospital course: 56 year old Spanish-speaking male with past medical history of end-stage renal disease on hemodialysis and hypertension hospitalized 2 months ago for acute GI bleed from gastric ulcers requiring 3 units packed red blood cells who presented to the emergency room on 11/27 afternoon from dialysis with shortness of breath.  Patient had completed all previous sessions of dialysis.  Workup revealed mild hypoxia and underlying multifocal airway disease and elevated procalcitonin consistent with pneumonia as well as hypertensive urgency with systolic blood pressure of 170's.  Patient admitted to the hospitalist service.  By 11/29, patient with little improvement.  Follow-up procalcitonin that day had increased to 4.74 (previously 2.3) and Rocephin/Zithromax changed to cefepime.  Although only receiving 1 dose, procalcitonin up to 8.3 by following day and CT scan of chest ordered.  By following day, patient with only mild improvement and worsening procalcitonin and antibiotics adjusted.   12/1: CAT scan reviewed.  Patient with significant interstitial lung disease, severe cardiomegaly/fluid overload, radiographic findings of ESLD/cirrhosis.  Lengthy discussion between myself, patient's wife, patient's daughter, palliative care.  Explained that patient has evidence of multiorgan failure and overall prognosis remains poor.  Patient states that he would not be want to be kept on the ventilator long-term however did want chest compressions and endotracheal intubation in the setting cardiopulmonary arrest.   12/2: Hyperglycemia noted.  Likely secondary to steroids.   12/3: No appreciable status changes.  Patient continues to complain of upper back pain. 12/5: No appreciable clinical changes.  Patient continues to endorse significant pain  mainly in upper back.  Palliative care had a discussion with patient and multiple family members including 4 children today.  Patient now a DNR.  Further discussion about goals of care with palliative scheduled for 12/7.  12/7.  Patient wants to continue full treatment course.  I asked physical therapy to reevaluate because patient complains of weakness and family concerned about taking him home.  Did worse with physical therapy and was orthostatic and dropped his oxygen saturations.  Midodrine started.    Assessment and Plan: * Orthostatic hypotension Patient was orthostatic with working with physical therapy today and almost passed out.  Will start midodrine.  Liver cirrhosis (HCC) Ammonia level normal, elevated liver function test, thrombocytopenia, ascites  Interstitial lung disease ([place]) Seen on CT scan of the chest.  Acute respiratory failure with hypoxia ([place]) Patient dropped oxygen saturations down into the 70s and likely will be chronic in nature.  Prior to disposition will have to qualify again for oxygen at home.  Acute on chronic combined systolic and diastolic CHF (congestive heart failure) (HCC) Last EF 40 to 45%.  Dialysis to remove fluid.  Multifocal pneumonia Completed 7 days of antibiotic.  ESRD on hemodialysis [facility]) Patient had dialysis yesterday.  Type 2 diabetes mellitus with hyperglycemia (HCC) On Semglee insulin and sliding scale.  Hypertensive emergency Elevated blood pressure on arrival.  Weakness Physical therapy recommending rehab but unlikely to happen since the patient does not have a payer source.  Anemia of chronic disease Last hemoglobin 10.6  PUD (peptic ulcer disease) History of upper GI bleed 07/2022 EGD on 10/9 showed nonbleeding gastric ulcers and gastritis Continue Protonix        Subjective: Patient states that there was a misunderstanding and he is interested in treatment.  Patient is interested in continuing dialysis.  Family  is major concern that he is very weak.  I explained to the family and patient that he needs to work with physical therapy and cannot refuse.  Patient had almost passing out episode with physical therapy and was orthostatic.  Pulse ox dropped down into the 70s off oxygen.  Physical Exam: Vitals:   09/20/22 0111 09/20/22 0447 09/20/22 0828 09/20/22 1551  BP: 138/79 (!) 162/71 (!) 150/75 (!) 115/59  Pulse: 69 72 64 67  Resp:  16 10 (!) 8  Temp:  98 F (36.7 C) (!) 97.5 F (36.4 C) 98 F (36.7 C)  TempSrc:  Oral Oral Oral  SpO2: 97% 96% 95% (!) 69%  Weight:      Height:       Physical Exam HENT:     Head: Normocephalic.     Mouth/Throat:     Pharynx: No oropharyngeal exudate.  Eyes:     General: Lids are normal.     Conjunctiva/sclera: Conjunctivae normal.  Cardiovascular:     Rate and Rhythm: Normal rate and regular rhythm.     Heart sounds: Normal heart sounds, S1 normal and S2 normal.  Pulmonary:     Breath sounds: Examination of the right-lower field reveals decreased breath sounds. Examination of the left-lower field reveals decreased breath sounds. Decreased breath sounds present. No wheezing, rhonchi or rales.  Abdominal:     Palpations: Abdomen is soft.     Tenderness: There is no abdominal tenderness.  Musculoskeletal:     Right lower leg: No swelling.     Left lower leg: No swelling.  Skin:    General: Skin is warm.     Findings: No rash.  Neurological:     Mental Status: He is alert.     Comments: Explained his wishes.     Data Reviewed: Sodium 132, creatinine 6.08, alkaline phosphatase 263, total bilirubin 3.8, ALT 77, AST 107, ammonia 35, hemoglobin 10.6, ANCA profile negative, ANA negative, complement normal range.  Family Communication: Spoke with family at the bedside  Disposition: Status is: Inpatient Remains inpatient appropriate because: Physical therapy recommending rehab now but without a payer source this will be unlikely.  Was orthostatic today,  starting midodrine.  Planned Discharge Destination: Home with Home Health    Time spent: 30 minutes Case discussed with TOC, palliative care and physical therapy and nephrology.  Author: Loletha Grayer, MD 09/20/2022 4:06 PM  For on call review [email or web address].

## 2022-09-20 NOTE — Evaluation (Signed)
Clinical/Bedside Swallow Evaluation Patient Details  Name: Jerry [name] MRN: [id number] Date of Birth: 23-Jun-1966  Today's Date: 09/20/2022 Time: SLP Start Time (ACUTE ONLY): 3295 SLP Stop Time (ACUTE ONLY): 1100 SLP Time Calculation (min) (ACUTE ONLY): 15 min  Past Medical History:  Past Medical History:  Diagnosis Date   Diabetes mellitus ([place])    ESRD on hemodialysis ([place])    GI bleed 07/19/2022   Hypertension    Renal disorder    Past Surgical History:  Past Surgical History:  Procedure Laterality Date   AV FISTULA PLACEMENT Left    ESOPHAGOGASTRODUODENOSCOPY (EGD) WITH PROPOFOL N/A 07/20/2022   Procedure: ESOPHAGOGASTRODUODENOSCOPY (EGD) WITH PROPOFOL;  Surgeon: Annamaria Helling, DO;  Location: [facility];  Service: Gastroenterology;  Laterality: N/A;   FOOT SURGERY     HPI:  Per H&P "Jerry [name] is a 56 y.o. male with medical history significant for ESRD on HD MWF, diet-controlled diabetes, essential hypertension, chronic back pain, hospitalized from 10/6 to 10/9 with acute GI blood loss anemia requiring 3 units PRBCs, and with nonbleeding gastric ulcers and gastritis on EGD, who presents to the ED from dialysis with a complaint of shortness of breath that started a week prior.  Patient denies any missed dialysis sessions and he was able to complete his dialysis session prior to being sent to the ED.  He denied cough, fever or chills or chest pain.  He denied lower extremity pain or swelling.  No recent nausea, vomiting or diarrhea .  He did endorse low back pain and knee pain which is typical for his chronic musculoskeletal pain.  O2 sat with EMS was 93% and he was placed on 2 L O2 for transport.  ED course and dataReview: BP 173/87 with pulse 75, respirations 20 and O2 sat 96% on room air.  Afebrile.  Labs significant for troponin 63 and BNP 3275(improved from 4447 on 10/5).  WBC normal, hemoglobin 10.3, improved from 8.1 a month ago.  BMP in keeping with  dialysis status but notable for anion gap of 18.  Potassium normal, bicarb 21 and blood glucose 126.  COVID and flu negative.  EKG, personally viewed and interpreted showing sinus at 76 with incomplete RBBB.  Chest x-ray significant for patchy bilateral airspace disease superimposed on interstitial lung disease, consistent with multifocal pneumonia versus pulmonary edema.  Patient received his home pain meds of Tylenol, tramadol and gabapentin for his complaints of pain.  He also received his home Protonix.  Discharge was attempted however patient desaturated to 88% after removal of oxygen.  Hospitalist consulted for admission." Chest CT "1. Diffuse interstitial thickening with irregular appearance. This  is seen in the LEFT and RIGHT chest with basilar to apical gradient.  No signs of bronchiectasis. Findings are suspicious for underlying  interstitial disease, difficult to exclude the possibility of  superimposed pulmonary edema or multifocal infection. Continued  attention on follow-up is suggested.  2. Moderate to marked cardiomegaly with valvular and coronary artery  disease.  3. Findings of liver disease/potential cirrhosis and ascites.  Ascites could also be due to volume overload. Correlate clinically.  4. Signs of end-stage renal disease with atrophic bilateral kidneys  incompletely evaluated.  5. Aortic atherosclerosis."    Assessment / Plan / Recommendation  Clinical Impression  Initiated session with video interpreter, [place]. Daughter and other family members present, and daughter endorsed pt understands [place] better than Spanish and that she would interpret for him. Pt agreed. Video interpreter d/c'd.  Pt endorsed eating and drinking  being effortful for him and that he needs to take rest breaks while eating. Per pt, this has been going on since Tuesday.   Pt seen for clinical swallowing evaluation. Oral motor examination completed and significant for xerostomia and missing dentition. Pt on  3L/min O2 via [place]. Mildly hoarse, but functional, vocal quality appreciated which improved with POs. Pt given trials of water, puree, and solid. Pt demonstrated an intact oral swallow c/b mildly prolonged, but functional, mastication of solids. Pharyngeal swallow appeared [facility] per clincial assessment. No overt s/sx pharyngeal dysphagia. Seemingly timely swallow initiation and seemingly adequate laryngeal elevation. No s/sx SOB observed.  Recommend continuation of regular diet with thin liquids and safe swallowing strategies/aspiration precautions as outlined below.   Reviewed diet recommendations, safe swallowing strategies/aspiration precautions, and energy conservation techniques with pt and daughter. Both verbalized understanding/agreement. RN made awarwe of results, recommendations, and SLP POC.   SLP Visit Diagnosis: Dysphagia, unspecified (R13.10)    Aspiration Risk  Mild aspiration risk    Diet Recommendation Regular;Thin liquid   Medication Administration:  (as tolerated) Supervision: Patient able to self feed;Staff to assist with self feeding;Full supervision/cueing for compensatory strategies (encourage self feeding) Compensations: Minimize environmental distractions;Slow rate;Small sips/bites Postural Changes: Seated upright at 90 degrees;Remain upright for at least 30 minutes after po intake    Other  Recommendations Oral Care Recommendations: Oral care QID;Staff/trained caregiver to provide oral care (set up and physical assistance PRN)    Recommendations for follow up therapy are one component of a multi-disciplinary discharge planning process, led by the attending physician.  Recommendations may be updated based on patient status, additional functional criteria and insurance authorization.  Follow up Recommendations No SLP follow up         Functional Status Assessment Patient has had a recent decline in their functional status and demonstrates the ability to make significant  improvements in function in a reasonable and predictable amount of time.         Prognosis Prognosis for Safe Diet Advancement: Fair Barriers to Reach Goals:  (overall medical status)      Swallow Study   General Date of Onset: 09/20/22 HPI: Per H&P "Rowyn Spilde is a 56 y.o. male with medical history significant for ESRD on HD MWF, diet-controlled diabetes, essential hypertension, chronic back pain, hospitalized from 10/6 to 10/9 with acute GI blood loss anemia requiring 3 units PRBCs, and with nonbleeding gastric ulcers and gastritis on EGD, who presents to the ED from dialysis with a complaint of shortness of breath that started a week prior.  Patient denies any missed dialysis sessions and he was able to complete his dialysis session prior to being sent to the ED.  He denied cough, fever or chills or chest pain.  He denied lower extremity pain or swelling.  No recent nausea, vomiting or diarrhea .  He did endorse low back pain and knee pain which is typical for his chronic musculoskeletal pain.  O2 sat with EMS was 93% and he was placed on 2 L O2 for transport.  ED course and dataReview: BP 173/87 with pulse 75, respirations 20 and O2 sat 96% on room air.  Afebrile.  Labs significant for troponin 63 and BNP 3275(improved from 4447 on 10/5).  WBC normal, hemoglobin 10.3, improved from 8.1 a month ago.  BMP in keeping with dialysis status but notable for anion gap of 18.  Potassium normal, bicarb 21 and blood glucose 126.  COVID and flu negative.  EKG, personally viewed and  interpreted showing sinus at 76 with incomplete RBBB.  Chest x-ray significant for patchy bilateral airspace disease superimposed on interstitial lung disease, consistent with multifocal pneumonia versus pulmonary edema.  Patient received his home pain meds of Tylenol, tramadol and gabapentin for his complaints of pain.  He also received his home Protonix.  Discharge was attempted however patient desaturated to 88% after  removal of oxygen.  Hospitalist consulted for admission." Chest CT "1. Diffuse interstitial thickening with irregular appearance. This  is seen in the LEFT and RIGHT chest with basilar to apical gradient.  No signs of bronchiectasis. Findings are suspicious for underlying  interstitial disease, difficult to exclude the possibility of  superimposed pulmonary edema or multifocal infection. Continued  attention on follow-up is suggested.  2. Moderate to marked cardiomegaly with valvular and coronary artery  disease.  3. Findings of liver disease/potential cirrhosis and ascites.  Ascites could also be due to volume overload. Correlate clinically.  4. Signs of end-stage renal disease with atrophic bilateral kidneys  incompletely evaluated.  5. Aortic atherosclerosis." Type of Study: Bedside Swallow Evaluation Diet Prior to this Study: Regular;Thin liquids Temperature Spikes Noted: No Respiratory Status: Nasal cannula History of Recent Intubation: No Behavior/Cognition: Alert Oral Cavity Assessment: Dry Oral Care Completed by SLP: Recent completion by staff Oral Cavity - Dentition:  (some missing teeth; upper partial)    Oral/Motor/Sensory Function Overall Oral Motor/Sensory Function: Within functional limits   Ice Chips Ice chips: Not tested   Thin Liquid Thin Liquid: Within functional limits Presentation: Straw Other Comments: ~4 oz    Nectar Thick Nectar Thick Liquid: Not tested   Honey Thick Honey Thick Liquid: Not tested   Puree Puree: Within functional limits Presentation: Spoon Other Comments: x1 tsp of applesauce   Solid     Solid: Within functional limits Presentation: Spoon Other Comments: x1 saltine; mildly prolonged, but functional, mastication     Cherrie Gauze, M.S., [facility] [phone] ([place])   Clearnce Sorrel Kailei Cowens 09/20/2022,11:25 AM

## 2022-09-20 NOTE — Assessment & Plan Note (Signed)
Seen on CT scan of the chest.

## 2022-09-20 NOTE — Evaluation (Signed)
Physical Therapy Evaluation Patient Details Name: Jerry [name] MRN: [id number] DOB: 1966-05-15 Today's Date: 09/20/2022  History of Present Illness  56 year old Spanish-speaking male with past medical history of end-stage renal disease on hemodialysis and hypertension hospitalized 2 months ago for acute GI bleed from gastric ulcers requiring 3 units packed red blood cells who presented to the emergency room on 11/27 afternoon from dialysis with shortness of breath.  Patient had completed all previous sessions of dialysis.  Workup revealed mild hypoxia and underlying multifocal airway disease and elevated procalcitonin consistent with pneumonia as well as hypertensive urgency with systolic blood pressure of 170's.  Clinical Impression  Pt is a pleasant 56 year old male who was admitted for SOB symptoms and now complaining of weakness. Pt performs bed mobility with cga, transfers with mod assist, and ambulation with min assist and use of RW. Pt demonstrates deficits with strength/mobility/endurance. MD requesting orthostatics and O2 sats. See below. Pt currently not at baseline level. Syncopal event noted, RN called in to address. Would benefit from skilled PT to address above deficits and promote optimal return to PLOF' recommend transition to STR upon discharge from acute hospitalization. SaO2 on room air at rest = 100% SaO2 on room air while ambulating = 70% SaO2 on n/a liters of O2 while ambulating = n/a  Orthostatics: Supine: 131/65; HR 68 Seated: 106/53; HR 71 Standing: 106/53; HR 71 + dizzy      Recommendations for follow up therapy are one component of a multi-disciplinary discharge planning process, led by the attending physician.  Recommendations may be updated based on patient status, additional functional criteria and insurance authorization.  Follow Up Recommendations Skilled nursing-short term rehab (<3 hours/day) Can patient physically be transported by private vehicle:  No    Assistance Recommended at Discharge Intermittent Supervision/Assistance  Patient can return home with the following  A lot of help with walking and/or transfers;A lot of help with bathing/dressing/bathroom;Help with stairs or ramp for entrance    Equipment Recommendations  (TBD)  Recommendations for Other Services       Functional Status Assessment Patient has had a recent decline in their functional status and demonstrates the ability to make significant improvements in function in a reasonable and predictable amount of time.     Precautions / Restrictions Precautions Precautions: Fall Restrictions Weight Bearing Restrictions: No      Mobility  Bed Mobility Overal bed mobility: Needs Assistance Bed Mobility: Supine to Sit     Supine to sit: Min guard     General bed mobility comments: needs assist secondary to visual deficits. Once seated, upright posture noted with close supervision    Transfers Overall transfer level: Needs assistance Equipment used: Rolling walker (2 wheels) Transfers: Sit to/from Stand Sit to Stand: Mod assist           General transfer comment: needs tactile cues for hand placement. Once standing, progresses to cga    Ambulation/Gait Ambulation/Gait assistance: Min assist Gait Distance (Feet): 4 Feet Assistive device: Rolling walker (2 wheels) Gait Pattern/deviations: Step-to pattern       General Gait Details: unsteady with short steps. Becomes SOB with exertion with O2 sats decreasing to 70%, needs increased assist to safely get back to bed. Once returned to sitting, pt had syncopal episode. RN called in and +2 to address. Once supine, pt awake and stable.  Stairs            Wheelchair Mobility    Modified Rankin (Stroke Patients Only)  Balance Overall balance assessment: Needs assistance Sitting-balance support: Feet supported Sitting balance-Leahy Scale: Good     Standing balance support: Bilateral  upper extremity supported Standing balance-Leahy Scale: Fair                               Pertinent Vitals/Pain Pain Assessment Pain Assessment: No/denies pain    Home Living Family/patient expects to be discharged to:: Private residence Living Arrangements: Spouse/significant other Available Help at Discharge: Family Type of Home: Mobile home Home Access: Ramped entrance       Home Layout: One level Home Equipment: Conservation officer, nature (2 wheels);Cane - single point      Prior Function Prior Level of Function : Independent/Modified Independent;Needs assist             Mobility Comments: uses SPC at baseline, minimal household ambulator ADLs Comments: needs assist for IADLs, modI for ADLs (assistance if needed more for vision impairment than weakness/cognition)     Hand Dominance        Extremity/Trunk Assessment   Upper Extremity Assessment Upper Extremity Assessment: Generalized weakness (B UE grossly 3/5)    Lower Extremity Assessment Lower Extremity Assessment: Generalized weakness (B LE grossly 3+/5)       Communication   Communication: Interpreter utilized (speaks Spanish dialect, may do better with in person interpreter)  Cognition Arousal/Alertness: Awake/alert Behavior During Therapy: WFL for tasks assessed/performed Overall Cognitive Status: Within Functional Limits for tasks assessed                                 General Comments: appears fatigued, resting in bed with eyes closed        General Comments      Exercises     Assessment/Plan    PT Assessment Patient needs continued PT services  PT Problem List Decreased strength;Decreased activity tolerance;Decreased balance;Decreased mobility       PT Treatment Interventions Gait training;DME instruction;Therapeutic exercise;Balance training    PT Goals (Current goals can be found in the Care Plan section)  Acute Rehab PT Goals Patient Stated Goal: to go  home PT Goal Formulation: With patient Time For Goal Achievement: 10/04/22 Potential to Achieve Goals: Fair    Frequency Min 2X/week     Co-evaluation               AM-PAC PT "6 Clicks" Mobility  Outcome Measure Help needed turning from your back to your side while in a flat bed without using bedrails?: None Help needed moving from lying on your back to sitting on the side of a flat bed without using bedrails?: A Little Help needed moving to and from a bed to a chair (including a wheelchair)?: A Lot Help needed standing up from a chair using your arms (e.g., wheelchair or bedside chair)?: A Lot Help needed to walk in hospital room?: A Lot Help needed climbing 3-5 steps with a railing? : Total 6 Click Score: 14    End of Session Equipment Utilized During Treatment: Gait belt;Oxygen Activity Tolerance: Treatment limited secondary to medical complications (Comment) Patient left: in bed;with bed alarm set;with nursing/sitter in room Nurse Communication: Mobility status PT Visit Diagnosis: Unsteadiness on feet (R26.81);Muscle weakness (generalized) (M62.81);Difficulty in walking, not elsewhere classified (R26.2)    Time: 6962-9528 PT Time Calculation (min) (ACUTE ONLY): 31 min   Charges:   PT Evaluation $PT Eval Low Complexity:  1 Low PT Treatments $Therapeutic Activity: 8-22 mins        Greggory Stallion, PT, DPT, [facility] [phone]   Jerry [name] 09/20/2022, 3:45 PM

## 2022-09-20 NOTE — Assessment & Plan Note (Addendum)
Patient was orthostatic with working with physical therapy today and almost passed out.  Continue midodrine for now but will stop if transfer is to hospice facility.

## 2022-09-20 NOTE — Progress Notes (Signed)
Patient ID: [name], male   DOB: [birth year], 56 y.o.   MRN: [id number]    Progress Note from the Palliative Medicine Team at [facility]   Patient Name: Jerry [name]        Date: 09/20/2022 DOB: August 02, 1966  Age: 56 y.o. MRN#: [id number] Attending Physician: Loletha Grayer, MD Primary Care Physician: Freddy Finner, NP Admit Date: 09/11/2022   Medical records reviewed, assessed patient and discussed with treatment team    56 y.o. male  with past medical history of ESRD (HD), recent hospitalization (2 months ago) for GI bleed with gastric ulcers and HTN admitted on 09/11/2022 with shortness of breath status post dialysis treatment.   Patient was being treated for multifocal airway disease.  Procalcitonin continued to elevate as well as hypertensive urgency with systolic blood pressures in the 170s.   On 12/1, CT scan revealed interstitial lung disease, severe cardiomegaly with fluid overload, and radiographic findings of ESLD/cirrhosis.   Given patient's multiorgan failure and overall poor prognosis, PMC was consulted to discuss goals of care.     This NP assessed patient at the bedside as a follow up for palliative medicine needs and emotional support, and to meet with family as scheduled for goals of care meeting.     Patient's wife, 2 daughters and son are at bedside today.  Initially Spanish interpreter utilized via computer however midway through family asked to  stop using the interpreter as patient's very specific dialect was getting lost in translation  This nurse practitioner attempted to explore patient and family decisions regarding treatment plan.  Again all family members verbalize a clear understanding of the seriousness of the patient's current medical situation and the reality that patient is running out of options to continue life-prolonging measures.    All agree that patient cannot return home and expect to continue with outpatient dialysis.   Family verbalize frustration in trying to help patient understand and accept his current medical situation.  Education offered on the difference between aggressive medical intervention path and a palliative comfort path for this patient, at this time and in this situation.  Education offered on hospice benefit; philosophy and eligibility  Education offered on the natural trajectory at end-of-life when patient makes decision to stop dialysis.  Prognosis being  less than 2 weeks.   Plan of care - DNR/DNI -Continue current treatment plan as long as "patient can remain in the hospital", if patient stabilizes and is ready for discharge  home -- family will need to have another  GOCs discussion   Education offered on patient's high risk for decompensation  For now patient and family wish to continue with all offered and available medical interventions to prolong life.  PMT will continue to support holistically   Questions and concerns addressed   Discussed with Dr Earleen Newport and nursing team via secure chat  75  minutes-greater than 50% of the time was spent in counseling and coordination of care.   Wadie Lessen NP  Palliative Medicine Team Team Phone # [phone] Pager [phone]

## 2022-09-20 NOTE — Assessment & Plan Note (Addendum)
Patient dropped oxygen saturations down into the 70s and likely will be chronic in nature.  Continue oxygen supplementation.  Underlying interstitial lung disease.

## 2022-09-20 NOTE — Assessment & Plan Note (Signed)
Ammonia level normal, elevated liver function test, thrombocytopenia, ascites

## 2022-09-21 DIAGNOSIS — Z515 Encounter for palliative care: Secondary | ICD-10-CM

## 2022-09-21 LAB — CBC WITH DIFFERENTIAL/PLATELET
Abs Immature Granulocytes: 0.09 10*3/uL — ABNORMAL HIGH (ref 0.00–0.07)
Basophils Absolute: 0 10*3/uL (ref 0.0–0.1)
Basophils Relative: 0 %
Eosinophils Absolute: 0 10*3/uL (ref 0.0–0.5)
Eosinophils Relative: 0 %
HCT: 28.9 % — ABNORMAL LOW (ref 39.0–52.0)
Hemoglobin: 9.9 g/dL — ABNORMAL LOW (ref 13.0–17.0)
Immature Granulocytes: 1 %
Lymphocytes Relative: 2 %
Lymphs Abs: 0.2 10*3/uL — ABNORMAL LOW (ref 0.7–4.0)
MCH: 33 pg (ref 26.0–34.0)
MCHC: 34.3 g/dL (ref 30.0–36.0)
MCV: 96.3 fL (ref 80.0–100.0)
Monocytes Absolute: 0.7 10*3/uL (ref 0.1–1.0)
Monocytes Relative: 5 %
Neutro Abs: 12.7 10*3/uL — ABNORMAL HIGH (ref 1.7–7.7)
Neutrophils Relative %: 92 %
Platelets: 161 10*3/uL (ref 150–400)
RBC: 3 MIL/uL — ABNORMAL LOW (ref 4.22–5.81)
RDW: 21.2 % — ABNORMAL HIGH (ref 11.5–15.5)
Smear Review: NORMAL
WBC: 13.8 10*3/uL — ABNORMAL HIGH (ref 4.0–10.5)
nRBC: 0.2 % (ref 0.0–0.2)

## 2022-09-21 LAB — RENAL FUNCTION PANEL
Albumin: 2.3 g/dL — ABNORMAL LOW (ref 3.5–5.0)
Anion gap: 12 (ref 5–15)
BUN: 103 mg/dL — ABNORMAL HIGH (ref 6–20)
CO2: 24 mmol/L (ref 22–32)
Calcium: 8.1 mg/dL — ABNORMAL LOW (ref 8.9–10.3)
Chloride: 95 mmol/L — ABNORMAL LOW (ref 98–111)
Creatinine, Ser: 8.07 mg/dL — ABNORMAL HIGH (ref 0.61–1.24)
GFR, Estimated: 7 mL/min — ABNORMAL LOW (ref >60)
Glucose, Bld: 126 mg/dL — ABNORMAL HIGH (ref 70–99)
Phosphorus: 6.9 mg/dL — ABNORMAL HIGH (ref 2.5–4.6)
Potassium: 5.1 mmol/L (ref 3.5–5.1)
Sodium: 131 mmol/L — ABNORMAL LOW (ref 135–145)

## 2022-09-21 LAB — GLUCOSE, CAPILLARY
Glucose-Capillary: 119 mg/dL — ABNORMAL HIGH (ref 70–99)
Glucose-Capillary: 120 mg/dL — ABNORMAL HIGH (ref 70–99)

## 2022-09-21 MED ORDER — GLYCOPYRROLATE 0.2 MG/ML IJ SOLN: 0.2000 mg | INTRAMUSCULAR | Status: DC | PRN

## 2022-09-21 MED ORDER — EPOETIN ALFA 4000 UNIT/ML IJ SOLN
INTRAMUSCULAR | Status: AC
  Administered 2022-09-21: 4000 [IU]
  Filled 2022-09-21: qty 1

## 2022-09-21 MED ORDER — POLYETHYLENE GLYCOL 3350 17 G PO PACK: 17.0000 g | PACK | Freq: Every day | ORAL | Status: DC | PRN

## 2022-09-21 MED ORDER — HALOPERIDOL LACTATE 2 MG/ML PO CONC: 0.5000 mg | ORAL | Status: DC | PRN

## 2022-09-21 MED ORDER — BIOTENE DRY MOUTH MT LIQD: 15.0000 mL | OROMUCOSAL | Status: DC | PRN

## 2022-09-21 MED ORDER — HALOPERIDOL 0.5 MG PO TABS: 0.5000 mg | ORAL_TABLET | ORAL | Status: DC | PRN

## 2022-09-21 MED ORDER — HALOPERIDOL LACTATE 5 MG/ML IJ SOLN: 0.5000 mg | INTRAMUSCULAR | Status: DC | PRN

## 2022-09-21 MED ORDER — CALCIUM CARBONATE ANTACID 500 MG PO CHEW
1.0000 | CHEWABLE_TABLET | Freq: Once | ORAL | Status: AC
  Administered 2022-09-21: 200 mg via ORAL
  Filled 2022-09-21: qty 1

## 2022-09-21 MED ORDER — GLYCOPYRROLATE 1 MG PO TABS: 1.0000 mg | ORAL_TABLET | ORAL | Status: DC | PRN

## 2022-09-21 MED ORDER — POLYVINYL ALCOHOL 1.4 % OP SOLN: 1.0000 [drp] | Freq: Four times a day (QID) | OPHTHALMIC | Status: DC | PRN

## 2022-09-21 NOTE — TOC Progression Note (Signed)
Transition of Care [facility]) - Progression Note    Patient Details  Name: Jerry [name] MRN: [id number] Date of Birth: January 28, 1966  Transition of Care [facility]) CM/SW Contact  Beverly Sessions, RN Phone Number: 09/21/2022, 3:29 PM  Clinical Narrative:     Met with patient, wife, daughter, and sister.   Discussed options for discharge.   They are all in agreement that to stop HD and pursue inpatient hospice.  MD met with family and he is also in agreement  Family would prefer hospice home in [place]  Referral made to [facility] with Manufacturing engineer.  [place] with nephrology updated   Expected Discharge Plan: Home/Self Care    Expected Discharge Plan and Services Expected Discharge Plan: Home/Self Care     Post Acute Care Choice: Durable Medical Equipment                                         Social Determinants of Health (SDOH) Interventions    Readmission Risk Interventions     No data to display

## 2022-09-21 NOTE — Progress Notes (Signed)
Pt completed and tolerated tx without difficulty.  Pt ran for 3.5hrs and removed 3.5kgs fluid.  BVP 82.1L.  pt had BM while on tx.  Post b/p stable at 123/63. Report given to floor RN.  No distress noted and no c/o by pt.  Left stable via transport.

## 2022-09-21 NOTE — Progress Notes (Addendum)
[facility]  ROUNDING NOTE   Subjective:   Jerry [name] is a 56 year old Spanish-speaking male with past medical conditions including hypertension, chronic back pain, diabetes, and end-stage renal disease on hemodialysis.  Patient presents to the emergency department from his dialysis center with shortness of breath and weakness.  Patient has been admitted for Acute pulmonary edema (HCC) [J81.0] Pneumonia [J18.9] Acute respiratory failure with hypoxia ([place]) [J96.01] Fluid overload [E87.70]  Patient is known to our practice and receives outpatient dialysis treatments at [facility] on a MWF schedule, supervised by Dr. Candiss Norse.   Patient seen and evaluated during dialysis   HEMODIALYSIS FLOWSHEET:  Blood Flow Rate (mL/min): 300 mL/min Arterial Pressure (mmHg): -80 mmHg Venous Pressure (mmHg): 300 mmHg TMP (mmHg): 15 mmHg Ultrafiltration Rate (mL/min): 1221 mL/min Dialysate Flow Rate (mL/min): 300 ml/min  Tolerating treatment well.   Remains on nasal cannula, lower extremity edema present   Objective:  Vital signs in last 24 hours:  Temp:  [97.4 F (36.3 C)-98.8 F (37.1 C)] 97.4 F (36.3 C) (12/08 1427) Pulse Rate:  [64-74] 73 (12/08 1427) Resp:  [8-28] 17 (12/08 1427) BP: (110-165)/(50-71) 135/50 (12/08 1427) SpO2:  [69 %-99 %] 92 % (12/08 1427) Weight:  [77 kg-77.2 kg] 77.2 kg (12/08 0919)  Weight change: -1.3 kg Filed Weights   09/19/22 1305 09/21/22 0500 09/21/22 0919  Weight: 75.8 kg 77 kg 77.2 kg    Intake/Output: No intake/output data recorded.   Intake/Output this shift:  Total I/O In: 480 [P.O.:480] Out: 3500 [Other:3500]  Physical Exam: General: NAD  Head: Normocephalic, atraumatic. Dry oral mucosal membranes  Eyes: Anicteric  Lungs:  Fine crackles, normal effort, [place] O2  Heart: Regular rate and rhythm  Abdomen:  Soft, nontender, nondistended  Extremities: 1+ peripheral edema.  Neurologic: Alert and oriented  Skin:  No lesions  Access: Left aVF    Basic Metabolic Panel: Recent Labs  Lab 09/15/22 0729 09/16/22 0751 09/17/22 1452 09/20/22 0501 09/21/22 0518  NA 129* 127* 126* 132* 131*  K 4.4 5.0 4.8 4.9 5.1  CL 91* 88* 86* 95* 95*  CO2 23 21* 23 25 24   GLUCOSE 300* 284* 184* 188* 126*  BUN 70* 92* 126* 81* 103*  CREATININE 7.13* 9.03* 10.10* 6.08* 8.07*  CALCIUM 9.2 9.6 9.0 8.7* 8.1*  PHOS  --   --  8.8*  --  6.9*     Liver Function Tests: Recent Labs  Lab 09/17/22 1452 09/18/22 0011 09/20/22 0501 09/21/22 0518  AST  --  35 107*  --   ALT  --  19 77*  --   ALKPHOS  --  306* 263*  --   BILITOT  --  3.3* 3.8*  --   PROT  --  8.2* 7.2  --   ALBUMIN 2.7* 2.6* 2.4* 2.3*    No results for input(s): "LIPASE", "AMYLASE" in the last 168 hours. Recent Labs  Lab 09/20/22 0501  AMMONIA 35     CBC: Recent Labs  Lab 09/15/22 0729 09/16/22 0751 09/17/22 1452 09/20/22 0501 09/21/22 0518  WBC 4.9 8.1 8.5  --  13.8*  NEUTROABS 4.6 7.6  --   --  12.7*  HGB 9.1* 9.1* 8.5* 10.6* 9.9*  HCT 26.1* 25.9* 24.0*  --  28.9*  MCV 96.3 94.9 93.0  --  96.3  PLT 155 136* 138*  --  161     Cardiac Enzymes: No results for input(s): "CKTOTAL", "CKMB", "CKMBINDEX", "TROPONINI" in the last 168 hours.  BNP:  Invalid input(s): "POCBNP"  CBG: Recent Labs  Lab 09/20/22 1202 09/20/22 1627 09/20/22 2100 09/21/22 0741 09/21/22 1421  GLUCAP 114* 165* 151* 119* 120*     Microbiology: Results for orders placed or performed during the hospital encounter of 09/11/22  Resp Panel by RT-PCR (Flu A&B, Covid) Anterior Nasal Swab     Status: None   Collection Time: 09/11/22 12:31 AM   Specimen: Anterior Nasal Swab  Result Value Ref Range Status   SARS Coronavirus 2 by RT PCR NEGATIVE NEGATIVE Final    Comment: (NOTE) SARS-CoV-2 target nucleic acids are NOT DETECTED.  The SARS-CoV-2 RNA is generally detectable in upper respiratory specimens during the acute phase of infection. The  lowest concentration of SARS-CoV-2 viral copies this assay can detect is 138 copies/mL. A negative result does not preclude SARS-Cov-2 infection and should not be used as the sole basis for treatment or other patient management decisions. A negative result may occur with  improper specimen collection/handling, submission of specimen other than nasopharyngeal swab, presence of viral mutation(s) within the areas targeted by this assay, and inadequate number of viral copies(<138 copies/mL). A negative result must be combined with clinical observations, patient history, and epidemiological information. The expected result is Negative.  Fact Sheet for Patients:  EntrepreneurPulse.com.au  Fact Sheet for Healthcare Providers:  IncredibleEmployment.be  This test is no t yet approved or cleared by the [place] FDA and  has been authorized for detection and/or diagnosis of SARS-CoV-2 by FDA under an Emergency Use Authorization (EUA). This EUA will remain  in effect (meaning this test can be used) for the duration of the COVID-19 declaration under Section 564(b)(1) of the Act, 21 U.S.C.section 360bbb-3(b)(1), unless the authorization is terminated  or revoked sooner.       Influenza A by PCR NEGATIVE NEGATIVE Final   Influenza B by PCR NEGATIVE NEGATIVE Final    Comment: (NOTE) The Xpert Xpress SARS-CoV-2/FLU/RSV plus assay is intended as an aid in the diagnosis of influenza from Nasopharyngeal swab specimens and should not be used as a sole basis for treatment. Nasal washings and aspirates are unacceptable for Xpert Xpress SARS-CoV-2/FLU/RSV testing.  Fact Sheet for Patients: EntrepreneurPulse.com.au  Fact Sheet for Healthcare Providers: IncredibleEmployment.be  This test is not yet approved or cleared by the [place] FDA and has been authorized for detection and/or diagnosis of SARS-CoV-2 by FDA under  an Emergency Use Authorization (EUA). This EUA will remain in effect (meaning this test can be used) for the duration of the COVID-19 declaration under Section 564(b)(1) of the Act, 21 U.S.C. section 360bbb-3(b)(1), unless the authorization is terminated or revoked.  Performed at [facility], [address] [place], [place] [address]   Culture, blood (routine x 2) Call MD if unable to obtain prior to antibiotics being given     Status: None   Collection Time: 09/11/22  5:22 AM   Specimen: BLOOD  Result Value Ref Range Status   Specimen Description BLOOD RIGHT FA  Final   Special Requests   Final    BOTTLES DRAWN AEROBIC AND ANAEROBIC Blood Culture results may not be optimal due to an inadequate volume of blood received in culture bottles   Culture   Final    NO GROWTH 5 DAYS Performed at [facility], [address], [place], [place] [address]    Report Status 09/16/2022 FINAL  Final  Culture, blood (routine x 2) Call MD if unable to obtain prior to antibiotics being given  Status: None   Collection Time: 09/12/22  6:07 AM   Specimen: BLOOD RIGHT HAND  Result Value Ref Range Status   Specimen Description BLOOD RIGHT HAND  Final   Special Requests   Final    BOTTLES DRAWN AEROBIC AND ANAEROBIC Blood Culture adequate volume   Culture   Final    NO GROWTH 5 DAYS Performed at [facility], [address] [place], [place] [address]    Report Status 09/17/2022 FINAL  Final  Surgical pcr screen     Status: None   Collection Time: 09/14/22  4:30 PM   Specimen: Nasal Mucosa; Nasal Swab  Result Value Ref Range Status   MRSA, PCR NEGATIVE NEGATIVE Final   Staphylococcus aureus NEGATIVE NEGATIVE Final    Comment: (NOTE) The Xpert SA Assay (FDA approved for NASAL specimens in patients 80 years of age and older), is one component of a comprehensive surveillance program. It is not intended to diagnose infection nor to guide or monitor  treatment. Performed at [facility], [place], [place] [address]   Respiratory (~20 pathogens) panel by PCR     Status: None   Collection Time: 09/15/22 10:12 AM   Specimen: Nasopharyngeal Swab; Respiratory  Result Value Ref Range Status   Adenovirus NOT DETECTED NOT DETECTED Final   Coronavirus 229E NOT DETECTED NOT DETECTED Final    Comment: (NOTE) The Coronavirus on the Respiratory Panel, DOES NOT test for the novel  Coronavirus (2019 nCoV)    Coronavirus HKU1 NOT DETECTED NOT DETECTED Final   Coronavirus NL63 NOT DETECTED NOT DETECTED Final   Coronavirus OC43 NOT DETECTED NOT DETECTED Final   Metapneumovirus NOT DETECTED NOT DETECTED Final   Rhinovirus / Enterovirus NOT DETECTED NOT DETECTED Final   Influenza A NOT DETECTED NOT DETECTED Final   Influenza B NOT DETECTED NOT DETECTED Final   Parainfluenza Virus 1 NOT DETECTED NOT DETECTED Final   Parainfluenza Virus 2 NOT DETECTED NOT DETECTED Final   Parainfluenza Virus 3 NOT DETECTED NOT DETECTED Final   Parainfluenza Virus 4 NOT DETECTED NOT DETECTED Final   Respiratory Syncytial Virus NOT DETECTED NOT DETECTED Final   Bordetella pertussis NOT DETECTED NOT DETECTED Final   Bordetella Parapertussis NOT DETECTED NOT DETECTED Final   Chlamydophila pneumoniae NOT DETECTED NOT DETECTED Final   Mycoplasma pneumoniae NOT DETECTED NOT DETECTED Final    Comment: Performed at [facility], [place]. [address] [place], [place] [address]    Coagulation Studies: No results for input(s): "LABPROT", "INR" in the last 72 hours.  Urinalysis: No results for input(s): "COLORURINE", "LABSPEC", "PHURINE", "GLUCOSEU", "HGBUR", "BILIRUBINUR", "KETONESUR", "PROTEINUR", "UROBILINOGEN", "NITRITE", "LEUKOCYTESUR" in the last 72 hours.  Invalid input(s): "APPERANCEUR"    Imaging: DG Lumbar Spine 2-3 Views  Result Date: 09/20/2022 CLINICAL DATA:  Back pain EXAM: LUMBAR SPINE - 2-3 VIEW COMPARISON:  Lumbar spine x-ray  08/17/2010 FINDINGS: There is no evidence of lumbar spine fracture. Alignment is normal. Intervertebral disc spaces are maintained. Prominent vascular calcifications are present. IMPRESSION: 1. No acute fracture. 2. Prominent vascular calcifications. Electronically Signed   By: Ronney Asters M.D.   On: 09/20/2022 17:43     Medications:    anticoagulant sodium citrate      calcium carbonate  1 tablet Oral TID WC   Chlorhexidine Gluconate Cloth  6 each Topical Q0600   epoetin (EPOGEN/PROCRIT) injection  4,000 Units Intravenous Q M,W,F-HD   feeding supplement (NEPRO CARB STEADY)  237 mL Oral BID BM  gabapentin  100 mg Oral Q1200   guaiFENesin  600 mg Oral BID   heparin  5,000 Units Subcutaneous Q8H   insulin aspart  0-5 Units Subcutaneous QHS   insulin aspart  0-6 Units Subcutaneous TID WC   insulin aspart  2 Units Subcutaneous TID WC   insulin glargine-yfgn  12 Units Subcutaneous Daily   lactulose  20 g Oral Daily   lidocaine  1 patch Transdermal Q24H   methocarbamol  750 mg Oral QID   midodrine  5 mg Oral TID WC   polyethylene glycol  17 g Oral Daily   predniSONE  40 mg Oral Q breakfast   sevelamer carbonate  1,600 mg Oral TID WC   acetaminophen **OR** acetaminophen, albuterol, alteplase, alum & mag hydroxide-simeth, anticoagulant sodium citrate, camphor-menthol, heparin, hydrALAZINE, HYDROmorphone (DILAUDID) injection, hydrOXYzine, ipratropium-albuterol, lidocaine (PF), lidocaine-prilocaine, ondansetron **OR** ondansetron (ZOFRAN) IV, mouth rinse, oxyCODONE, pentafluoroprop-tetrafluoroeth  Assessment/ Plan:  Mr. Jerry [name] is a 56 y.o.  male with past medical conditions including hypertension, chronic back pain, diabetes, and end-stage renal disease on hemodialysis.  Patient presents to the emergency department from his dialysis center with shortness of breath and weakness.  Patient has been admitted for Acute pulmonary edema (HCC) [J81.0] Pneumonia [J18.9] Acute  respiratory failure with hypoxia (HCC) [J96.01] Fluid overload [E87.70]  [facility]/MWF/left aVF  End-stage renal disease on hemodialysis.  Receiving dialysis today, UF goal 3.5 L as tolerated.  Next treatment scheduled for Monday.  2. Anemia of chronic kidney disease Lab Results  Component Value Date   HGB 9.9 (L) 09/21/2022    Patient receives Mircera at outpatient clinic.  Hemoglobin acceptable.  3. Secondary Hyperparathyroidism: with outpatient labs: PTH 818, phosphorus 9.1, calcium 8.9 on 08/27/22.   Lab Results  Component Value Date   CALCIUM 8.1 (L) 09/21/2022   PHOS 6.9 (H) 09/21/2022    Calcium within target however phosphorus elevated.  Continue sevelamer with meals.   4.  Community-acquired pneumonia.  Chest x-ray suspicious for multifocal pneumonia.  Respiratory panel negative.  Currently receiving cefepime per primary team. CT chest shows interstitial disease, cannot exclude superimposed pulmonary edema or multifocal infection.  Palliative care following patient to establish goals of care.  Patient now DNR.  Family and patient request continuation of all current treatments.  *Late entry: Patient and family have decided to discontinue hemodialysis and proceed with inpatient hospice.  Will not offer any further dialysis from this time.   LOS: [place] 12/8/20232:48 PM

## 2022-09-21 NOTE — Progress Notes (Signed)
OT Cancellation Note  Patient Details Name: Jerry [name] MRN: [id number] DOB: 05/14/1966   Cancelled Treatment:    Reason Eval/Treat Not Completed: Other (comment). Noted plans for comfort care, will sign off at this time. Please re-consult if new needs arise.   Dessie Coma, M.S. OTR/L  09/21/22, 3:54 PM  ascom [phone]

## 2022-09-21 NOTE — Progress Notes (Signed)
[facility] [facility]) Hospice hospital liaison note  Received [facility] from [facility] for family interest in hospice home.   Chart under review and hospice home eligibility is pending at this time. Have talked with daughter by phone to acknowledge referral and explain services.   Unfortunately hospice home does not have a bed to offer today. TOC is aware hospital liaison will follow up tomorrow or sooner if room becomes available.   Please do not hesitate to call with any hospice related questions or concerns.   Thank you for the opportunity to participate in this patient's care.  Jhonnie Garner, Therapist, sports, [facility] Liaison  [phone]

## 2022-09-21 NOTE — Progress Notes (Signed)
Progress Note   Patient: Jerry [name] NGE:[id number] DOB: 13-May-1966 DOA: 09/11/2022     10 DOS: the patient was seen and examined on 09/21/2022   Brief hospital course: 56 year old Spanish-speaking male with past medical history of end-stage renal disease on hemodialysis and hypertension hospitalized 2 months ago for acute GI bleed from gastric ulcers requiring 3 units packed red blood cells who presented to the emergency room on 11/27 afternoon from dialysis with shortness of breath.  Patient had completed all previous sessions of dialysis.  Workup revealed mild hypoxia and underlying multifocal airway disease and elevated procalcitonin consistent with pneumonia as well as hypertensive urgency with systolic blood pressure of 170's.  Patient admitted to the hospitalist service.  By 11/29, patient with little improvement.  Follow-up procalcitonin that day had increased to 4.74 (previously 2.3) and Rocephin/Zithromax changed to cefepime.  Although only receiving 1 dose, procalcitonin up to 8.3 by following day and CT scan of chest ordered.  By following day, patient with only mild improvement and worsening procalcitonin and antibiotics adjusted.   12/1: CAT scan reviewed.  Patient with significant interstitial lung disease, severe cardiomegaly/fluid overload, radiographic findings of ESLD/cirrhosis.  Lengthy discussion between myself, patient's wife, patient's daughter, palliative care.  Explained that patient has evidence of multiorgan failure and overall prognosis remains poor.  Patient states that he would not be want to be kept on the ventilator long-term however did want chest compressions and endotracheal intubation in the setting cardiopulmonary arrest.   12/2: Hyperglycemia noted.  Likely secondary to steroids.   12/3: No appreciable status changes.  Patient continues to complain of upper back pain. 12/5: No appreciable clinical changes.  Patient continues to endorse significant pain  mainly in upper back.  Palliative care had a discussion with patient and multiple family members including 4 children today.  Patient now a DNR.  Further discussion about goals of care with palliative scheduled for 12/7.  12/7.  Patient wants to continue full treatment course.  I asked physical therapy to reevaluate because patient complains of weakness and family concerned about taking him home.  Did worse with physical therapy and was orthostatic and dropped his oxygen saturations.  Midodrine started.  12/8.  Patient interested in stopping dialysis at this time and moving towards hospice care inpatient facility.  Explained that he has multiple organ systems that are requiring support including a blood pressure, lungs with interstitial lung disease and oxygen supplementation, kidneys with dialysis and liver with cirrhosis.  Consulted hospice liaison.    Assessment and Plan: * End of life care Patient has changed his mind and wanting to proceed with hospice care and stop dialysis.  Orthostatic hypotension Patient was orthostatic with working with physical therapy today and almost passed out.  Continue midodrine for now but will stop if transfer is to hospice facility.  Liver cirrhosis (HCC) Ammonia level normal, elevated liver function test, thrombocytopenia, ascites  Interstitial lung disease ([place]) Seen on CT scan of the chest.  Acute respiratory failure with hypoxia ([place]) Patient dropped oxygen saturations down into the 70s and likely will be chronic in nature.  Continue oxygen supplementation.  Underlying interstitial lung disease.  Acute on chronic combined systolic and diastolic CHF (congestive heart failure) (HCC) Last EF 40 to 45%.   Multifocal pneumonia Completed 7 days of antibiotic.  ESRD on hemodialysis [facility]) Patient had dialysis today.  Called into room about stopping dialysis.  Type 2 diabetes mellitus with hyperglycemia (HCC) Will discontinue insulin and sliding scale  for at  this time since pursuing comfort measures.  Hypertensive emergency Elevated blood pressure on arrival.  Now orthostatic.  Weakness Physical therapy recommending rehab but unlikely to happen since the patient does not have a payer source.  Anemia of chronic disease Last hemoglobin 9.9  PUD (peptic ulcer disease) History of upper GI bleed 07/2022 EGD on 10/9 showed nonbleeding gastric ulcers and gastritis Continue Protonix        Subjective: Patient interested in stopping dialysis and pursuing hospice care upon discharge.  Advised that we would stop dialysis currently while here and that would make him a candidate for hospice.  Patient cold and having chills.  Physical Exam: Vitals:   09/21/22 1300 09/21/22 1305 09/21/22 1427 09/21/22 1452  BP: (!) 116/59 123/63 (!) 135/50   Pulse: 70 73 73   Resp: 10 (!) 28 17   Temp:  98 F (36.7 C) (!) 97.4 F (36.3 C)   TempSrc:  Oral Oral   SpO2: 95% 96% 92%   Weight:    73.8 kg  Height:       Physical Exam HENT:     Head: Normocephalic.     Mouth/Throat:     Pharynx: No oropharyngeal exudate.  Eyes:     General: Lids are normal.     Conjunctiva/sclera: Conjunctivae normal.  Cardiovascular:     Rate and Rhythm: Normal rate and regular rhythm.     Heart sounds: Normal heart sounds, S1 normal and S2 normal.  Pulmonary:     Breath sounds: Examination of the right-lower field reveals decreased breath sounds. Examination of the left-lower field reveals decreased breath sounds. Decreased breath sounds present. No wheezing, rhonchi or rales.  Abdominal:     Palpations: Abdomen is soft.     Tenderness: There is no abdominal tenderness.  Musculoskeletal:     Right lower leg: No swelling.     Left lower leg: No swelling.  Skin:    General: Skin is warm.     Findings: No rash.  Neurological:     Mental Status: He is alert.     Comments: Explained his wishes through family.     Data Reviewed: Sodium 131, creatinine 8.07,  hemoglobin 9.9  Family Communication: Spoke with family at the bedside and daughter outside the room.  Disposition: Status is: Inpatient Remains inpatient appropriate because: Now moving towards comfort care measures.  Patient will be a candidate for hospice home since stopping dialysis.  Planned Discharge Destination: Hopefully hospice facility    Time spent: 28 minutes  Author: Loletha Grayer, MD 09/21/2022 3:43 PM  For on call review [email or web address].

## 2022-09-21 NOTE — Progress Notes (Signed)
Delay to HD d/t patient personal needs. Will get when pt available.

## 2022-09-21 NOTE — Assessment & Plan Note (Signed)
Patient has changed his mind and wanting to proceed with hospice care and stop dialysis.

## 2022-09-21 NOTE — Progress Notes (Signed)
Pre hd rn assessment 

## 2022-09-21 NOTE — Progress Notes (Signed)
OT Cancellation Note  Patient Details Name: Jerry [name] MRN: [id number] DOB: 1965-12-24   Cancelled Treatment:    Reason Eval/Treat Not Completed: Patient at procedure or test/ unavailable. Order received, chart reviewed. Upon arrival pt transporting off unit for HD, will hold and re-attempt as able.   Dessie Coma, M.S. OTR/L  09/21/22, 9:14 AM  ascom [phone]

## 2022-09-22 MED ORDER — OXYCODONE HCL 5 MG PO TABS
10.0000 mg | ORAL_TABLET | ORAL | Status: DC | PRN
  Administered 2022-09-22 – 2022-09-23 (×2): 10 mg via ORAL
  Filled 2022-09-22 (×3): qty 2

## 2022-09-22 MED ORDER — CALCIUM CARBONATE ANTACID 500 MG PO CHEW
2.0000 | CHEWABLE_TABLET | Freq: Four times a day (QID) | ORAL | Status: DC | PRN
  Administered 2022-09-22: 400 mg via ORAL
  Filled 2022-09-22 (×2): qty 2

## 2022-09-22 MED ORDER — MIDODRINE HCL 5 MG PO TABS
2.5000 mg | ORAL_TABLET | Freq: Three times a day (TID) | ORAL | Status: DC
  Administered 2022-09-22 – 2022-09-23 (×3): 2.5 mg via ORAL
  Filled 2022-09-22 (×3): qty 1

## 2022-09-22 MED ORDER — HYDROMORPHONE HCL 1 MG/ML IJ SOLN
0.5000 mg | INTRAMUSCULAR | Status: DC | PRN
  Administered 2022-09-22: 0.5 mg via INTRAVENOUS
  Filled 2022-09-22: qty 0.5

## 2022-09-22 MED ORDER — HYDROMORPHONE HCL 1 MG/ML IJ SOLN: 0.5000 mg | INTRAMUSCULAR | Status: DC | PRN

## 2022-09-22 NOTE — Progress Notes (Signed)
Progress Note   Patient: Jerry [name] MGN:[id number] DOB: 07-26-1966 DOA: 09/11/2022     11 DOS: the patient was seen and examined on 09/22/2022   Brief hospital course: 56 year old Spanish-speaking male with past medical history of end-stage renal disease on hemodialysis and hypertension hospitalized 2 months ago for acute GI bleed from gastric ulcers requiring 3 units packed red blood cells who presented to the emergency room on 11/27 afternoon from dialysis with shortness of breath.  Patient had completed all previous sessions of dialysis.  Workup revealed mild hypoxia and underlying multifocal airway disease and elevated procalcitonin consistent with pneumonia as well as hypertensive urgency with systolic blood pressure of 170's.  Patient admitted to the hospitalist service.  By 11/29, patient with little improvement.  Follow-up procalcitonin that day had increased to 4.74 (previously 2.3) and Rocephin/Zithromax changed to cefepime.  Although only receiving 1 dose, procalcitonin up to 8.3 by following day and CT scan of chest ordered.  By following day, patient with only mild improvement and worsening procalcitonin and antibiotics adjusted.   12/1: CAT scan reviewed.  Patient with significant interstitial lung disease, severe cardiomegaly/fluid overload, radiographic findings of ESLD/cirrhosis.  Lengthy discussion between myself, patient's wife, patient's daughter, palliative care.  Explained that patient has evidence of multiorgan failure and overall prognosis remains poor.  Patient states that he would not be want to be kept on the ventilator long-term however did want chest compressions and endotracheal intubation in the setting cardiopulmonary arrest.   12/2: Hyperglycemia noted.  Likely secondary to steroids.   12/3: No appreciable status changes.  Patient continues to complain of upper back pain. 12/5: No appreciable clinical changes.  Patient continues to endorse significant pain  mainly in upper back.  Palliative care had a discussion with patient and multiple family members including 4 children today.  Patient now a DNR.  Further discussion about goals of care with palliative scheduled for 12/7.  12/7.  Patient wants to continue full treatment course.  I asked physical therapy to reevaluate because patient complains of weakness and family concerned about taking him home.  Did worse with physical therapy and was orthostatic and dropped his oxygen saturations.  Midodrine started.  12/8.  Patient interested in stopping dialysis at this time and moving towards hospice care inpatient facility.  Explained that he has multiple organ systems that are requiring support including a blood pressure, lungs with interstitial lung disease and oxygen supplementation, kidneys with dialysis and liver with cirrhosis.  Consulted hospice liaison.    Assessment and Plan: * End of life care Patient has changed his mind and wanting to proceed with hospice care and stop dialysis.  Changed as needed Dilaudid to 0.5 mg every 3 hours as needed.  Orthostatic hypotension Patient was orthostatic with working with physical therapy today and almost passed out.  Continue midodrine for now at lower dose but will stop if transfer is to hospice facility.  Liver cirrhosis (HCC) Ammonia level normal, elevated liver function test, thrombocytopenia, ascites  Interstitial lung disease ([place]) Seen on CT scan of the chest.  Acute respiratory failure with hypoxia ([place]) Patient dropped oxygen saturations down into the 70s and likely will be chronic in nature.  Continue oxygen supplementation.  Underlying interstitial lung disease.  Acute on chronic combined systolic and diastolic CHF (congestive heart failure) (HCC) Last EF 40 to 45%.   Multifocal pneumonia Completed 7 days of antibiotic.  ESRD on hemodialysis ([place]) Last dialysis session on 12/8.  No further dialysis at this time.  Type 2 diabetes  mellitus with hyperglycemia (HCC) Will discontinue insulin and sliding scale for at this time since pursuing comfort measures.  Hypertensive emergency Elevated blood pressure on arrival.  Now orthostatic.  Weakness Likely will go to the hospice home.  Anemia of chronic disease Last hemoglobin 9.9  PUD (peptic ulcer disease) History of upper GI bleed 07/2022 EGD on 10/9 showed nonbleeding gastric ulcers and gastritis Continue Protonix  Chronic back pain Continue pain medications.        Subjective: Patient this morning having shortness of breath and chest pain but chest pain improved after IV Dilaudid.  Patient comfort care measures as of yesterday.  Patient was lying flat.  Physical Exam: Vitals:   09/21/22 1305 09/21/22 1427 09/21/22 1452 09/22/22 0356  BP: 123/63 (!) 135/50  (!) 162/77  Pulse: 73 73  76  Resp: (!) 28 17  20   Temp: 98 F (36.7 C) (!) 97.4 F (36.3 C)  98.1 F (36.7 C)  TempSrc: Oral Oral  Oral  SpO2: 96% 92%  100%  Weight:   73.8 kg 74 kg  Height:       Physical Exam HENT:     Head: Normocephalic.     Mouth/Throat:     Pharynx: No oropharyngeal exudate.  Eyes:     General: Lids are normal.     Conjunctiva/sclera: Conjunctivae normal.  Cardiovascular:     Rate and Rhythm: Normal rate and regular rhythm.     Heart sounds: Normal heart sounds, S1 normal and S2 normal.  Pulmonary:     Breath sounds: Examination of the right-lower field reveals decreased breath sounds. Examination of the left-lower field reveals decreased breath sounds. Decreased breath sounds present. No wheezing, rhonchi or rales.  Abdominal:     Palpations: Abdomen is soft.     Tenderness: There is no abdominal tenderness.  Musculoskeletal:     Right lower leg: No swelling.     Left lower leg: No swelling.  Skin:    General: Skin is warm.     Findings: No rash.  Neurological:     Mental Status: He is alert.     Comments: Answers questions appropriately through speaking  through family.     Data Reviewed: No further lab because  Family Communication: Spoke with family at bedside  Disposition: Status is: Inpatient Remains inpatient appropriate because: Awaiting to hear about hospice home.  Planned Discharge Destination: Hospice facility when bed available    Time spent: 27 minutes  Author: Loletha Grayer, MD 09/22/2022 2:40 PM  For on call review [email or web address].

## 2022-09-22 NOTE — Assessment & Plan Note (Signed)
Continue pain medications.

## 2022-09-22 NOTE — Progress Notes (Signed)
[facility]) Hospice hospital liaison note  Follow up on new referral for inpatient hospice.   [facility] does not have any bed availability today. I spoke with the patient's daughter, Jerry [name] ([phone]) to notify her of this information and once a bed was available, we would evaluate her father for appropriateness.  She verbalized understanding.  Currently, Jerry [name] is having some shortness of breath and chest pain earlier this morning that was relieved with IV Dilaudid.  Doses were changed today by Dr. Leslye Peer to help with symptom burden- from .25mg  Dilaudid every 4 hours to .5mg  Dilaudid every 3 hours.    Encourage use of PRN's for shortness of breath and pain.  We will continue to follow and monitor and when a bed is availability, Jerry [name] will be evaluated for hospice inpatient eligibility.    Thank you for allowing participation I this patient's care.  Dimas Aguas, RN [facility] [phone]

## 2022-09-23 MED ORDER — CAMPHOR-MENTHOL 0.5-0.5 % EX LOTN: TOPICAL_LOTION | CUTANEOUS | 0 refills | Status: DC | PRN

## 2022-09-23 MED ORDER — CALCIUM CARBONATE ANTACID 500 MG PO CHEW: 2.0000 | CHEWABLE_TABLET | Freq: Four times a day (QID) | ORAL | Status: DC | PRN

## 2022-09-23 MED ORDER — HYDROXYZINE HCL 25 MG PO TABS: 25.0000 mg | ORAL_TABLET | Freq: Three times a day (TID) | ORAL | 0 refills | Status: DC | PRN

## 2022-09-23 MED ORDER — ACETAMINOPHEN 325 MG PO TABS: 650.0000 mg | ORAL_TABLET | Freq: Four times a day (QID) | ORAL | Status: DC | PRN

## 2022-09-23 MED ORDER — HYDROMORPHONE HCL-NACL 50-0.9 MG/50ML-% IV SOLN
0.5000 mg/h | INTRAVENOUS | Status: DC
  Administered 2022-09-23: 0.5 mg/h via INTRAVENOUS
  Filled 2022-09-23: qty 50

## 2022-09-23 MED ORDER — HYDROMORPHONE HCL 1 MG/ML IJ SOLN: 0.5000 mg | INTRAMUSCULAR | 0 refills | Status: DC | PRN

## 2022-09-23 MED ORDER — HYDROMORPHONE HCL 1 MG/ML IJ SOLN
0.5000 mg | INTRAMUSCULAR | Status: DC | PRN
  Administered 2022-09-23: 0.5 mg via INTRAVENOUS
  Filled 2022-09-23: qty 0.5

## 2022-09-23 MED ORDER — ACETAMINOPHEN 650 MG RE SUPP: 650.0000 mg | Freq: Four times a day (QID) | RECTAL | 0 refills | Status: DC | PRN

## 2022-09-23 MED ORDER — MIDODRINE HCL 5 MG PO TABS: 10.0000 mg | ORAL_TABLET | Freq: Three times a day (TID) | ORAL | Status: DC

## 2022-09-23 MED ORDER — GLYCOPYRROLATE 0.2 MG/ML IJ SOLN: 0.2000 mg | INTRAMUSCULAR | Status: DC | PRN

## 2022-09-23 MED ORDER — ONDANSETRON HCL 4 MG/2ML IJ SOLN: 4.0000 mg | Freq: Four times a day (QID) | INTRAMUSCULAR | 0 refills | Status: DC | PRN

## 2022-09-23 MED ORDER — BIOTENE DRY MOUTH MT LIQD: 15.0000 mL | OROMUCOSAL | Status: DC | PRN

## 2022-09-23 MED ORDER — CALCIUM CARBONATE ANTACID 500 MG PO CHEW
2.0000 | CHEWABLE_TABLET | Freq: Four times a day (QID) | ORAL | Status: DC | PRN
  Administered 2022-09-23: 400 mg via ORAL
  Filled 2022-09-23: qty 2

## 2022-09-23 MED ORDER — ORAL CARE MOUTH RINSE: 15.0000 mL | OROMUCOSAL | 0 refills | Status: DC | PRN

## 2022-09-23 MED ORDER — POLYVINYL ALCOHOL 1.4 % OP SOLN: 1.0000 [drp] | Freq: Four times a day (QID) | OPHTHALMIC | 0 refills | Status: DC | PRN

## 2022-09-23 MED ORDER — HYDROMORPHONE HCL 1 MG/ML IJ SOLN
0.5000 mg | INTRAMUSCULAR | Status: DC | PRN
  Administered 2022-09-23 (×2): 0.5 mg via INTRAVENOUS
  Filled 2022-09-23 (×2): qty 0.5

## 2022-09-23 MED ORDER — HALOPERIDOL LACTATE 5 MG/ML IJ SOLN: 0.5000 mg | INTRAMUSCULAR | Status: DC | PRN

## 2022-10-15 NOTE — Progress Notes (Addendum)
[facility]) Hospital Liaison Note  Received request for Hospice Home late Friday afternoon. No beds available Friday or yesterday.   Hospice eligibility confirmed. Bed available today and family wishes to move patient to [place] for EOL Care. Services explained and questions answered.   Please send completed and signed DNR with patient.   Bedside RN please call report to [phone].  Please call with any hospice related questions or concerns.  Thank you, Margaretmary Eddy, BSN, RN [facility] Liaison [phone]

## 2022-10-15 NOTE — Progress Notes (Signed)
EMS has arrived to transport patient, IV dilaudid drip stopped and wasted. IV push of dilaudid given prior to transport.

## 2022-10-15 NOTE — Discharge Summary (Signed)
Physician Discharge Summary   Patient: Jerry [name] MRN: [id number] DOB: 04/25/1966  Admit date:     09/11/2022  Discharge date: 2022-10-18  Discharge Physician: Loletha Grayer   PCP: Freddy Finner, NP   Recommendations at discharge:   Follow-up team hospice home 1 day  Discharge Diagnoses: Principal Problem:   End of life care Active Problems:   Orthostatic hypotension   Liver cirrhosis (HCC)   Acute respiratory failure with hypoxia (HCC)   Interstitial lung disease (HCC)   ESRD on hemodialysis (HCC)   Multifocal pneumonia   Acute on chronic combined systolic and diastolic CHF (congestive heart failure) (HCC)   Type 2 diabetes mellitus with hyperglycemia (HCC)   Hypertensive emergency   Chronic back pain   Fluid overload   PUD (peptic ulcer disease)   Anemia of chronic disease   Weakness    Hospital Course: 57 year old Spanish-speaking male with past medical history of end-stage renal disease on hemodialysis and hypertension hospitalized 2 months ago for acute GI bleed from gastric ulcers requiring 3 units packed red blood cells who presented to the emergency room on 11/27 afternoon from dialysis with shortness of breath.  Patient had completed all previous sessions of dialysis.  Workup revealed mild hypoxia and underlying multifocal airway disease and elevated procalcitonin consistent with pneumonia as well as hypertensive urgency with systolic blood pressure of 170's.  Patient admitted to the hospitalist service.  By 11/29, patient with little improvement.  Follow-up procalcitonin that day had increased to 4.74 (previously 2.3) and Rocephin/Zithromax changed to cefepime.  Although only receiving 1 dose, procalcitonin up to 8.3 by following day and CT scan of chest ordered.  By following day, patient with only mild improvement and worsening procalcitonin and antibiotics adjusted.   12/1: CAT scan reviewed.  Patient with significant interstitial lung disease,  severe cardiomegaly/fluid overload, radiographic findings of ESLD/cirrhosis.  Lengthy discussion between myself, patient's wife, patient's daughter, palliative care.  Explained that patient has evidence of multiorgan failure and overall prognosis remains poor.  Patient states that he would not be want to be kept on the ventilator long-term however did want chest compressions and endotracheal intubation in the setting cardiopulmonary arrest.   12/2: Hyperglycemia noted.  Likely secondary to steroids.   12/3: No appreciable status changes.  Patient continues to complain of upper back pain. 12/5: No appreciable clinical changes.  Patient continues to endorse significant pain mainly in upper back.  Palliative care had a discussion with patient and multiple family members including 4 children today.  Patient now a DNR.  Further discussion about goals of care with palliative scheduled for 12/7.  12/7.  Patient wants to continue full treatment course.  I asked physical therapy to reevaluate because patient complains of weakness and family concerned about taking him home.  Did worse with physical therapy and was orthostatic and dropped his oxygen saturations.  Midodrine started.  12/8.  Patient interested in stopping dialysis at this time and moving towards hospice care inpatient facility.  Explained that he has multiple organ systems that are requiring support including a blood pressure, lungs with interstitial lung disease and oxygen supplementation, kidneys with dialysis and liver with cirrhosis.  Consulted hospice liaison.  12/10.  Patient having more shortness of breath this morning.  Started Dilaudid drip after family arrived.  This afternoon and notified that we did get a hospice home bed.  Family interested in hospice home.  Patient also agreeable.  Spoke with hospice liaison and will discontinue Dilaudid drip prior to  transport.  Hospice home will do pushes of Dilaudid over there to start  with.   Assessment and Plan: * End of life care Continue comfort care measures.  I started Dilaudid drip today with increased shortness of breath and using accessory muscles to breathe.  This afternoon got excepted to the hospice facility.  They will use pushes instead of the drip initially.  Will continue drip until the time of transport and will give a push prior to transport.  Orthostatic hypotension Midodrine discontinued  Liver cirrhosis (HCC) Ammonia level normal, elevated liver function test, thrombocytopenia, ascites  Interstitial lung disease (HCC) Seen on CT scan of the chest.  Acute respiratory failure with hypoxia (HCC) Patient dropped oxygen saturations down into the 70s and likely will be chronic in nature.  Continue oxygen supplementation.  Underlying interstitial lung disease.  Acute on chronic combined systolic and diastolic CHF (congestive heart failure) (HCC) Last EF 40 to 45%.   Multifocal pneumonia Completed 7 days of antibiotic.  ESRD on hemodialysis ([place]) Last dialysis session on 12/8.  No further dialysis at this time.  Type 2 diabetes mellitus with hyperglycemia (HCC) Will discontinue insulin and sliding scale for at this time since pursuing comfort measures.  Hypertensive emergency Elevated blood pressure on arrival.  Now orthostatic.  Weakness Will go to the hospice home.  Anemia of chronic disease Last hemoglobin 9.9  PUD (peptic ulcer disease) History of upper GI bleed 07/2022 EGD on 10/9 showed nonbleeding gastric ulcers and gastritis   Chronic back pain Continue pain medications.         Consultants: Hospice liaison, nephrology, palliative care Procedures performed: None Disposition: Hospice facility Diet recommendation:  As tolerated DISCHARGE MEDICATION: Allergies as of 2022-09-29       Reactions   No Allergies On File         Medication List     STOP taking these medications    acetaminophen 650 MG CR  tablet Commonly known as: TYLENOL Replaced by: acetaminophen 325 MG tablet   gabapentin 300 MG capsule Commonly known as: NEURONTIN   pantoprazole 40 MG tablet Commonly known as: Protonix   Renvela 800 MG tablet Generic drug: sevelamer carbonate   traMADol 50 MG tablet Commonly known as: ULTRAM       TAKE these medications    acetaminophen 325 MG tablet Commonly known as: TYLENOL Take 2 tablets (650 mg total) by mouth every 6 (six) hours as needed for mild pain (or Fever >/= 101). Replaces: acetaminophen 650 MG CR tablet   acetaminophen 650 MG suppository Commonly known as: TYLENOL Place 1 suppository (650 mg total) rectally every 6 (six) hours as needed for mild pain (or Fever >/= 101).   antiseptic oral rinse Liqd Apply 15 mLs topically as needed for dry mouth.   mouth rinse Liqd solution 15 mLs by Mouth Rinse route as needed (for oral care).   calcium carbonate 500 MG chewable tablet Commonly known as: TUMS - dosed in mg elemental calcium Chew 2 tablets (400 mg of elemental calcium total) by mouth every 6 (six) hours as needed for indigestion or heartburn. What changed:  how much to take when to take this reasons to take this additional instructions   camphor-menthol lotion Commonly known as: SARNA Apply topically as needed for itching.   glycopyrrolate 0.2 MG/ML injection Commonly known as: ROBINUL Inject 1 mL (0.2 mg total) into the vein every 4 (four) hours as needed (excessive secretions).   haloperidol lactate 5 MG/ML injection Commonly known  as: HALDOL Inject 0.1 mLs (0.5 mg total) into the vein every 4 (four) hours as needed (or delirium).   HYDROmorphone 1 MG/ML injection Commonly known as: DILAUDID Inject 0.5 mLs (0.5 mg total) into the vein every hour as needed for severe pain.   hydrOXYzine 25 MG tablet Commonly known as: ATARAX Take 1 tablet (25 mg total) by mouth 3 (three) times daily as needed for itching.   ondansetron 4 MG/2ML Soln  injection Commonly known as: ZOFRAN Inject 2 mLs (4 mg total) into the vein every 6 (six) hours as needed for nausea.   polyvinyl alcohol 1.4 % ophthalmic solution Commonly known as: LIQUIFILM TEARS Place 1 drop into both eyes 4 (four) times daily as needed for dry eyes.        Follow-up Information     Hospice home Follow up in 1 day(s).                 Discharge Exam: Filed Weights   09/21/22 1452 09/22/22 0356 Oct 18, 2022 0433  Weight: 73.8 kg 74 kg 75.6 kg   Physical Exam HENT:     Head: Normocephalic.     Mouth/Throat:     Pharynx: No oropharyngeal exudate.  Eyes:     General: Lids are normal.     Conjunctiva/sclera: Conjunctivae normal.  Cardiovascular:     Rate and Rhythm: Normal rate and regular rhythm.     Heart sounds: Normal heart sounds, S1 normal and S2 normal.  Pulmonary:     Effort: Accessory muscle usage present.     Breath sounds: Examination of the right-lower field reveals decreased breath sounds. Examination of the left-lower field reveals decreased breath sounds. Decreased breath sounds present. No wheezing, rhonchi or rales.  Abdominal:     Palpations: Abdomen is soft.     Tenderness: There is no abdominal tenderness.  Musculoskeletal:     Right lower leg: No swelling.     Left lower leg: No swelling.  Skin:    General: Skin is warm.     Findings: No rash.  Neurological:     Mental Status: He is alert.     Comments: Answers questions appropriately through speaking through family.      Condition at discharge: serious  The results of significant diagnostics from this hospitalization (including imaging, microbiology, ancillary and laboratory) are listed below for reference.   Imaging Studies: DG Lumbar Spine 2-3 Views  Result Date: 09/20/2022 CLINICAL DATA:  Back pain EXAM: LUMBAR SPINE - 2-3 VIEW COMPARISON:  Lumbar spine x-ray 08/17/2010 FINDINGS: There is no evidence of lumbar spine fracture. Alignment is normal. Intervertebral disc  spaces are maintained. Prominent vascular calcifications are present. IMPRESSION: 1. No acute fracture. 2. Prominent vascular calcifications. Electronically Signed   By: Ronney Asters M.D.   On: 09/20/2022 17:43   CT CHEST WO CONTRAST  Result Date: 09/13/2022 CLINICAL DATA:  57 year old male presenting for evaluation of empyema and hypoxia. EXAM: CT CHEST WITHOUT CONTRAST TECHNIQUE: Multidetector CT imaging of the chest was performed following the standard protocol without IV contrast. RADIATION DOSE REDUCTION: This exam was performed according to the departmental dose-optimization program which includes automated exposure control, adjustment of the mA and/or kV according to patient size and/or use of iterative reconstruction technique. COMPARISON:  Chest radiograph from September 10, 2022. FINDINGS: Cardiovascular: Calcified aortic atherosclerosis. This is moderate to marked without dilation of the thoracic aorta. Heart size is moderately markedly enlarged. Small pericardial effusion. Three-vessel coronary artery disease. Mitral annular and aortic valvular  calcification. Mediastinum/Nodes: No signs of adenopathy in the chest. Mildly patulous esophagus. Lungs/Pleura: Diffuse interstitial thickening with irregular appearance. This is seen in the LEFT and RIGHT chest with basilar to apical gradient. No signs of bronchiectasis. No signs of lobar consolidative changes. Signs of scattered ground-glass intermixed with these areas. Airways are patent though slit like mainstem bronchi are noted bilaterally. No pneumothorax. No substantial pleural fluid. Upper Abdomen: Incidental imaging of upper abdominal contents show signs of fissural widening of hepatic fissures. Presence of ascites is noted. Small to moderate volume, incompletely assessed. Signs of end-stage renal disease. No upper abdominal lymphadenopathy aortic atherosclerosis of the abdominal aorta. Musculoskeletal: No chest wall mass or suspicious bone lesions  identified. IMPRESSION: 1. Diffuse interstitial thickening with irregular appearance. This is seen in the LEFT and RIGHT chest with basilar to apical gradient. No signs of bronchiectasis. Findings are suspicious for underlying interstitial disease, difficult to exclude the possibility of superimposed pulmonary edema or multifocal infection. Continued attention on follow-up is suggested. 2. Moderate to marked cardiomegaly with valvular and coronary artery disease. 3. Findings of liver disease/potential cirrhosis and ascites. Ascites could also be due to volume overload. Correlate clinically. 4. Signs of end-stage renal disease with atrophic bilateral kidneys incompletely evaluated. 5. Aortic atherosclerosis. Aortic Atherosclerosis (ICD10-I70.0). Electronically Signed   By: Zetta Bills M.D.   On: 09/13/2022 10:47   ECHOCARDIOGRAM COMPLETE  Result Date: 09/11/2022    ECHOCARDIOGRAM REPORT   Patient Name:   Jerry [name] Date of Exam: 09/11/2022 Medical Rec #:  [id number]              Height:       66.0 in Accession #:    [id number]             Weight:       172.0 lb Date of Birth:  11/15/1965              BSA:          1.875 m Patient Age:    70 years               BP:           134/68 mmHg Patient Gender: M                      HR:           70 bpm. Exam Location:  [facility] Procedure: 2D Echo, Cardiac Doppler and Color Doppler Indications:     CHF-acute diastolic S28.31  History:         Patient has no prior history of Echocardiogram examinations.                  Risk Factors:Diabetes and Hypertension.  Sonographer:     Sherrie Sport Referring Phys:  [id number] Athena Masse Diagnosing Phys: Nelva Bush MD IMPRESSIONS  1. Left ventricular ejection fraction, by estimation, is 45 to 50%. The left ventricle has mildly decreased function. The left ventricle demonstrates global hypokinesis. Left ventricular diastolic parameters are consistent with Grade II diastolic dysfunction (pseudonormalization).  2. Right  ventricular systolic function is mildly reduced. The right ventricular size is moderately enlarged. There is severely elevated pulmonary artery systolic pressure.  3. Left atrial size was mildly dilated.  4. Right atrial size was moderately dilated.  5. Moderate pleural effusion.  6. The mitral valve is abnormal. Mild mitral valve regurgitation. Mild mitral stenosis. The mean mitral valve gradient is 5.0 mmHg.  7.  Tricuspid valve regurgitation is moderate to severe.  8. The aortic valve was not well visualized. There is mild calcification of the aortic valve. There is mild thickening of the aortic valve. Aortic valve regurgitation is not visualized. Aortic valve sclerosis/calcification is present, without any evidence of aortic stenosis.  9. The inferior vena cava is normal in size with <50% respiratory variability, suggesting right atrial pressure of 8 mmHg. FINDINGS  Left Ventricle: Left ventricular ejection fraction, by estimation, is 45 to 50%. The left ventricle has mildly decreased function. The left ventricle demonstrates global hypokinesis. The left ventricular internal cavity size was normal in size. There is  no left ventricular hypertrophy. Left ventricular diastolic parameters are consistent with Grade II diastolic dysfunction (pseudonormalization). Right Ventricle: The right ventricular size is moderately enlarged. No increase in right ventricular wall thickness. Right ventricular systolic function is mildly reduced. There is severely elevated pulmonary artery systolic pressure. The tricuspid regurgitant velocity is 4.70 m/s, and with an assumed right atrial pressure of 8 mmHg, the estimated right ventricular systolic pressure is 92.1 mmHg. Left Atrium: Left atrial size was mildly dilated. Right Atrium: Right atrial size was moderately dilated. Pericardium: There is no evidence of pericardial effusion. Mitral Valve: The mitral valve is abnormal. There is severe thickening of the posterior mitral valve  leaflet(s). There is moderate calcification of the posterior mitral valve leaflet(s). Mild mitral valve regurgitation. Mild mitral valve stenosis. MV peak gradient, 10.4 mmHg. The mean mitral valve gradient is 5.0 mmHg. Tricuspid Valve: The tricuspid valve is normal in structure. Tricuspid valve regurgitation is moderate to severe. Aortic Valve: The aortic valve was not well visualized. There is mild calcification of the aortic valve. There is mild thickening of the aortic valve. Aortic valve regurgitation is not visualized. Aortic valve sclerosis/calcification is present, without any evidence of aortic stenosis. Aortic valve mean gradient measures 6.3 mmHg. Aortic valve peak gradient measures 10.8 mmHg. Aortic valve area, by VTI measures 1.83 cm. Pulmonic Valve: The pulmonic valve was normal in structure. Pulmonic valve regurgitation is trivial. No evidence of pulmonic stenosis. Aorta: The aortic root is normal in size and structure. Pulmonary Artery: The pulmonary artery is not well seen. Venous: The inferior vena cava is normal in size with less than 50% respiratory variability, suggesting right atrial pressure of 8 mmHg. IAS/Shunts: No atrial level shunt detected by color flow Doppler. Additional Comments: There is a moderate pleural effusion.  LEFT VENTRICLE PLAX 2D LVIDd:         5.40 cm   Diastology LVIDs:         3.90 cm   LV e' medial:    5.00 cm/s LV PW:         1.00 cm   LV E/e' medial:  24.4 LV IVS:        1.00 cm   LV e' lateral:   7.62 cm/s LVOT diam:     2.00 cm   LV E/e' lateral: 16.0 LV SV:         54 LV SV Index:   29 LVOT Area:     3.14 cm  RIGHT VENTRICLE RV Basal diam:  5.50 cm RV Mid diam:    4.20 cm RV S prime:     9.25 cm/s TAPSE (M-mode): 1.7 cm LEFT ATRIUM             Index        RIGHT ATRIUM           Index LA diam:  5.40 cm 2.88 cm/m   RA Area:     25.30 cm LA Vol (A2C):   70.5 ml 37.59 ml/m  RA Volume:   90.90 ml  48.47 ml/m LA Vol (A4C):   63.4 ml 33.81 ml/m LA Biplane  Vol: 71.3 ml 38.02 ml/m  AORTIC VALVE                     PULMONIC VALVE AV Area (Vmax):    1.62 cm      PR End Diast Vel: 1.86 msec AV Area (Vmean):   1.61 cm AV Area (VTI):     1.83 cm AV Vmax:           164.33 cm/s AV Vmean:          114.000 cm/s AV VTI:            0.294 m AV Peak Grad:      10.8 mmHg AV Mean Grad:      6.3 mmHg LVOT Vmax:         84.60 cm/s LVOT Vmean:        58.500 cm/s LVOT VTI:          0.171 m LVOT/AV VTI ratio: 0.58  AORTA Ao Root diam: 2.80 cm MITRAL VALVE                TRICUSPID VALVE MV Area (PHT): 3.63 cm     TR Peak grad:   88.4 mmHg MV Area VTI:   1.16 cm     TR Vmax:        470.00 cm/s MV Peak grad:  10.4 mmHg MV Mean grad:  5.0 mmHg     SHUNTS MV Vmax:       1.61 m/s     Systemic VTI:  0.17 m MV Vmean:      108.0 cm/s   Systemic Diam: 2.00 cm MV Decel Time: 209 msec MV E velocity: 122.00 cm/s MV A velocity: 111.00 cm/s MV E/A ratio:  1.10 Harrell Gave End MD Electronically signed by Nelva Bush MD Signature Date/Time: 09/11/2022/7:22:30 PM    Final    NM Pulmonary Perfusion  Result Date: 09/11/2022 CLINICAL DATA:  Intermediate probability EXAM: NUCLEAR MEDICINE PERFUSION LUNG SCAN TECHNIQUE: Perfusion images were obtained in multiple projections after intravenous injection of radiopharmaceutical. Ventilation scans intentionally deferred if perfusion scan and chest x-ray adequate for interpretation during COVID 19 epidemic. RADIOPHARMACEUTICALS:  4.39 mCi Tc-77m MAA IV COMPARISON:  Chest x-ray dated September 10, 2022 FINDINGS: Heterogeneous perfusion of the bilateral lungs which is likely due to diffuse airspace disease seen on prior chest x-ray. Assessment for segmental perfusion defects is limited and findings are nondiagnostic. IMPRESSION: Nondiagnostic exam. Recommend further evaluation with chest CTA. Electronically Signed   By: Yetta Glassman M.D.   On: 09/11/2022 11:20   US Venous Img Lower Bilateral (DVT)  Result Date: 09/11/2022 CLINICAL DATA:   57 year old male with history of acute respiratory failure with hypoxia. Elevated D-dimer. EXAM: BILATERAL LOWER EXTREMITY VENOUS DOPPLER ULTRASOUND TECHNIQUE: Gray-scale sonography with compression, as well as color and duplex ultrasound, were performed to evaluate the deep venous system(s) from the level of the common femoral vein through the popliteal and proximal calf veins. COMPARISON:  None Available. FINDINGS: VENOUS Normal compressibility of the common femoral, superficial femoral, and popliteal veins, as well as the visualized calf veins. Visualized portions of profunda femoral vein and great saphenous vein unremarkable. No filling defects to suggest DVT on grayscale or color Doppler  imaging. Doppler waveforms show normal direction of venous flow, normal respiratory plasticity and response to augmentation. Limited views of the contralateral common femoral vein are unremarkable. OTHER None. Limitations: none IMPRESSION: Negative. Electronically Signed   By: Vinnie Langton M.D.   On: 09/11/2022 06:22   DG Chest 2 View  Result Date: 09/10/2022 CLINICAL DATA:  Chest pain EXAM: CHEST - 2 VIEW COMPARISON:  07/19/2022 FINDINGS: Enlarged cardiac silhouette. Ectatic aorta. There is patchy airspace disease superimposed on interstitial chronic lung disease. Increased opacities in the LEFT and RIGHT lower lobe. No pneumothorax. IMPRESSION: Patchy bilateral airspace disease superimposed on interstitial lung disease. Findings concerning for multifocal pneumonia versus pulmonary edema. Electronically Signed   By: Suzy Bouchard M.D.   On: 09/10/2022 16:09    Microbiology: Results for orders placed or performed during the hospital encounter of 09/11/22  Resp Panel by RT-PCR (Flu A&B, Covid) Anterior Nasal Swab     Status: None   Collection Time: 09/11/22 12:31 AM   Specimen: Anterior Nasal Swab  Result Value Ref Range Status   SARS Coronavirus 2 by RT PCR NEGATIVE NEGATIVE Final    Comment:  (NOTE) SARS-CoV-2 target nucleic acids are NOT DETECTED.  The SARS-CoV-2 RNA is generally detectable in upper respiratory specimens during the acute phase of infection. The lowest concentration of SARS-CoV-2 viral copies this assay can detect is 138 copies/mL. A negative result does not preclude SARS-Cov-2 infection and should not be used as the sole basis for treatment or other patient management decisions. A negative result may occur with  improper specimen collection/handling, submission of specimen other than nasopharyngeal swab, presence of viral mutation(s) within the areas targeted by this assay, and inadequate number of viral copies(<138 copies/mL). A negative result must be combined with clinical observations, patient history, and epidemiological information. The expected result is Negative.  Fact Sheet for Patients:  EntrepreneurPulse.com.au  Fact Sheet for Healthcare Providers:  IncredibleEmployment.be  This test is no t yet approved or cleared by the [place] FDA and  has been authorized for detection and/or diagnosis of SARS-CoV-2 by FDA under an Emergency Use Authorization (EUA). This EUA will remain  in effect (meaning this test can be used) for the duration of the COVID-19 declaration under Section 564(b)(1) of the Act, 21 U.S.C.section 360bbb-3(b)(1), unless the authorization is terminated  or revoked sooner.       Influenza A by PCR NEGATIVE NEGATIVE Final   Influenza B by PCR NEGATIVE NEGATIVE Final    Comment: (NOTE) The Xpert Xpress SARS-CoV-2/FLU/RSV plus assay is intended as an aid in the diagnosis of influenza from Nasopharyngeal swab specimens and should not be used as a sole basis for treatment. Nasal washings and aspirates are unacceptable for Xpert Xpress SARS-CoV-2/FLU/RSV testing.  Fact Sheet for Patients: EntrepreneurPulse.com.au  Fact Sheet for Healthcare  Providers: IncredibleEmployment.be  This test is not yet approved or cleared by the [place] FDA and has been authorized for detection and/or diagnosis of SARS-CoV-2 by FDA under an Emergency Use Authorization (EUA). This EUA will remain in effect (meaning this test can be used) for the duration of the COVID-19 declaration under Section 564(b)(1) of the Act, 21 U.S.C. section 360bbb-3(b)(1), unless the authorization is terminated or revoked.  Performed at [facility], [address] [place], [place] [address]   Culture, blood (routine x 2) Call MD if unable to obtain prior to antibiotics being given     Status: None   Collection Time: 09/11/22  5:22 AM   Specimen:  BLOOD  Result Value Ref Range Status   Specimen Description BLOOD RIGHT FA  Final   Special Requests   Final    BOTTLES DRAWN AEROBIC AND ANAEROBIC Blood Culture results may not be optimal due to an inadequate volume of blood received in culture bottles   Culture   Final    NO GROWTH 5 DAYS Performed at [facility], [address] [place], [place] [address]    Report Status 09/16/2022 FINAL  Final  Culture, blood (routine x 2) Call MD if unable to obtain prior to antibiotics being given     Status: None   Collection Time: 09/12/22  6:07 AM   Specimen: BLOOD RIGHT HAND  Result Value Ref Range Status   Specimen Description BLOOD RIGHT HAND  Final   Special Requests   Final    BOTTLES DRAWN AEROBIC AND ANAEROBIC Blood Culture adequate volume   Culture   Final    NO GROWTH 5 DAYS Performed at [facility], [address] [place], [place] [address]    Report Status 09/17/2022 FINAL  Final  Surgical pcr screen     Status: None   Collection Time: 09/14/22  4:30 PM   Specimen: Nasal Mucosa; Nasal Swab  Result Value Ref Range Status   MRSA, PCR NEGATIVE NEGATIVE Final   Staphylococcus aureus NEGATIVE NEGATIVE Final    Comment: (NOTE) The Xpert SA Assay (FDA  approved for NASAL specimens in patients 38 years of age and older), is one component of a comprehensive surveillance program. It is not intended to diagnose infection nor to guide or monitor treatment. Performed at [facility], [place], [place] [address]   Respiratory (~20 pathogens) panel by PCR     Status: None   Collection Time: 09/15/22 10:12 AM   Specimen: Nasopharyngeal Swab; Respiratory  Result Value Ref Range Status   Adenovirus NOT DETECTED NOT DETECTED Final   Coronavirus 229E NOT DETECTED NOT DETECTED Final    Comment: (NOTE) The Coronavirus on the Respiratory Panel, DOES NOT test for the novel  Coronavirus (2019 nCoV)    Coronavirus HKU1 NOT DETECTED NOT DETECTED Final   Coronavirus NL63 NOT DETECTED NOT DETECTED Final   Coronavirus OC43 NOT DETECTED NOT DETECTED Final   Metapneumovirus NOT DETECTED NOT DETECTED Final   Rhinovirus / Enterovirus NOT DETECTED NOT DETECTED Final   Influenza A NOT DETECTED NOT DETECTED Final   Influenza B NOT DETECTED NOT DETECTED Final   Parainfluenza Virus 1 NOT DETECTED NOT DETECTED Final   Parainfluenza Virus 2 NOT DETECTED NOT DETECTED Final   Parainfluenza Virus 3 NOT DETECTED NOT DETECTED Final   Parainfluenza Virus 4 NOT DETECTED NOT DETECTED Final   Respiratory Syncytial Virus NOT DETECTED NOT DETECTED Final   Bordetella pertussis NOT DETECTED NOT DETECTED Final   Bordetella Parapertussis NOT DETECTED NOT DETECTED Final   Chlamydophila pneumoniae NOT DETECTED NOT DETECTED Final   Mycoplasma pneumoniae NOT DETECTED NOT DETECTED Final    Comment: Performed at [facility], [place]. [address], [place], [place] [address]    Labs: CBC: Recent Labs  Lab 09/17/22 1452 09/20/22 0501 09/21/22 0518  WBC 8.5  --  13.8*  NEUTROABS  --   --  12.7*  HGB 8.5* 10.6* 9.9*  HCT 24.0*  --  28.9*  MCV 93.0  --  96.3  PLT 138*  --  482   Basic Metabolic Panel: Recent Labs  Lab 09/17/22 1452 09/20/22 0501  09/21/22 0518  NA 126* 132* 131*  K 4.8 4.9 5.1  CL 86* 95* 95*  CO2 23 25 24   GLUCOSE 184* 188* 126*  BUN 126* 81* 103*  CREATININE 10.10* 6.08* 8.07*  CALCIUM 9.0 8.7* 8.1*  PHOS 8.8*  --  6.9*   Liver Function Tests: Recent Labs  Lab 09/17/22 1452 09/18/22 0011 09/20/22 0501 09/21/22 0518  AST  --  35 107*  --   ALT  --  19 77*  --   ALKPHOS  --  306* 263*  --   BILITOT  --  3.3* 3.8*  --   PROT  --  8.2* 7.2  --   ALBUMIN 2.7* 2.6* 2.4* 2.3*   CBG: Recent Labs  Lab 09/20/22 1202 09/20/22 1627 09/20/22 2100 09/21/22 0741 09/21/22 1421  GLUCAP 114* 165* 151* 119* 120*    Discharge time spent: greater than 30 minutes.  Signed: Loletha Grayer, MD [facility] 10/02/22

## 2022-10-15 NOTE — TOC Transition Note (Signed)
Transition of Care [facility]) - CM/SW Discharge Note   Patient Details  Name: Jerry [name] MRN: [id number] Date of Birth: 08-14-1966  Transition of Care [facility] CM/SW Contact:  Valente David, RN Phone Number: 10/20/2022, 1:32 PM   Clinical Narrative:     Patient will discharge to [facility] today.  EMS paperwork completed, T. En[place] with [facility] will arrange transport once consents are obtained.  Notify TOC team for any additional needs.   Final next level of care: [place] Barriers to Discharge: Barriers Resolved   Patient Goals and CMS Choice Patient states their goals for this hospitalization and ongoing recovery are:: hospice home CMS Medicare.gov Compare Post Acute Care list provided to:: Patient    Discharge Placement                       Discharge Plan and Services     Post Acute Care Choice: Durable Medical Equipment                               Social Determinants of Health (SDOH) Interventions     Readmission Risk Interventions     No data to display

## 2022-10-15 NOTE — Progress Notes (Signed)
PT Cancellation Note  Patient Details Name: Kathleen Likins MRN: [id number] DOB: [birth year]   Cancelled Treatment:    Reason Eval/Treat Not Completed: Other (comment) Per MD (verbally) he reports pt is currently comfort care & PT can sign off. Will complete current orders.  Lavone Nian, PT, DPT 2022/10/22, 10:41 AM  Waunita Schooner 10-22-2022, 10:40 AM

## 2022-10-15 NOTE — Plan of Care (Signed)
Patient has been deemed appropriate for discharge per provider assessment, patient will be going to [facility] there is a bed available for him. Report has been called to Garen Lah with [place] [facility]. Dilaudid drip will be stopped prior to transport. Multiple family members remain at bedside. Discharge instruction given to family and papers will be sent with EMS transport to facility.  Problem: Education: Goal: Ability to demonstrate management of disease process will improve Outcome: Adequate for Discharge Goal: Ability to verbalize understanding of medication therapies will improve Outcome: Adequate for Discharge Goal: Individualized Educational Video(s) Outcome: Adequate for Discharge   Problem: Activity: Goal: Capacity to carry out activities will improve Outcome: Adequate for Discharge   Problem: Cardiac: Goal: Ability to achieve and maintain adequate cardiopulmonary perfusion will improve Outcome: Adequate for Discharge   Problem: Education: Goal: Knowledge of General Education information will improve Description: Including pain rating scale, medication(s)/side effects and non-pharmacologic comfort measures Outcome: Adequate for Discharge   Problem: Health Behavior/Discharge Planning: Goal: Ability to manage health-related needs will improve Outcome: Adequate for Discharge   Problem: Clinical Measurements: Goal: Ability to maintain clinical measurements within normal limits will improve Outcome: Adequate for Discharge Goal: Will remain free from infection Outcome: Adequate for Discharge Goal: Diagnostic test results will improve Outcome: Adequate for Discharge Goal: Respiratory complications will improve Outcome: Adequate for Discharge Goal: Cardiovascular complication will be avoided Outcome: Adequate for Discharge   Problem: Activity: Goal: Risk for activity intolerance will decrease Outcome: Adequate for Discharge   Problem: Nutrition: Goal: Adequate  nutrition will be maintained Outcome: Adequate for Discharge   Problem: Elimination: Goal: Will not experience complications related to bowel motility Outcome: Adequate for Discharge Goal: Will not experience complications related to urinary retention Outcome: Adequate for Discharge   Problem: Coping: Goal: Level of anxiety will decrease Outcome: Adequate for Discharge   Problem: Pain Managment: Goal: General experience of comfort will improve Outcome: Adequate for Discharge   Problem: Safety: Goal: Ability to remain free from injury will improve Outcome: Adequate for Discharge   Problem: Skin Integrity: Goal: Risk for impaired skin integrity will decrease Outcome: Adequate for Discharge   Problem: Education: Goal: Knowledge of the prescribed therapeutic regimen will improve Outcome: Adequate for Discharge   Problem: Coping: Goal: Ability to identify and develop effective coping behavior will improve Outcome: Adequate for Discharge   Problem: Role Relationship: Goal: Family's ability to cope with current situation will improve Outcome: Adequate for Discharge Goal: Ability to verbalize concerns, feelings, and thoughts to partner or family member will improve Outcome: Adequate for Discharge   Problem: Respiratory: Goal: Verbalizations of increased ease of respirations will increase Outcome: Adequate for Discharge   Problem: Pain Management: Goal: Satisfaction with pain management regimen will improve Outcome: Adequate for Discharge   Problem: Pain Management: Goal: Satisfaction with pain management regimen will improve Outcome: Adequate for Discharge

## 2022-10-15 DEATH — deceased

## 2022-11-21 DIAGNOSIS — N186 End stage renal disease: Secondary | ICD-10-CM | POA: Insufficient documentation

## 2022-11-22 ENCOUNTER — Ambulatory Visit: Payer: Self-pay | Admitting: Gastroenterology

## 2023-03-11 IMAGING — CT CT ABD-PELV W/O CM
2 of 4 series · 15 of 46 positions shown, 17 images · non-contrast
Comparison: CT 12/30/2013

CLINICAL DATA: Abdominal pain, acute, nonlocalized

EXAM:
CT ABDOMEN AND PELVIS WITHOUT CONTRAST
TECHNIQUE: Multidetector CT imaging of the abdomen and pelvis was performed
following the standard protocol without IV contrast.

[Series 2: routine abd/pel wo · axial · 0.90mm/px · z∈[-902,-406]mm · 12 of 113 slices shown, 14 images]
[im 9/113  soft-tissue]
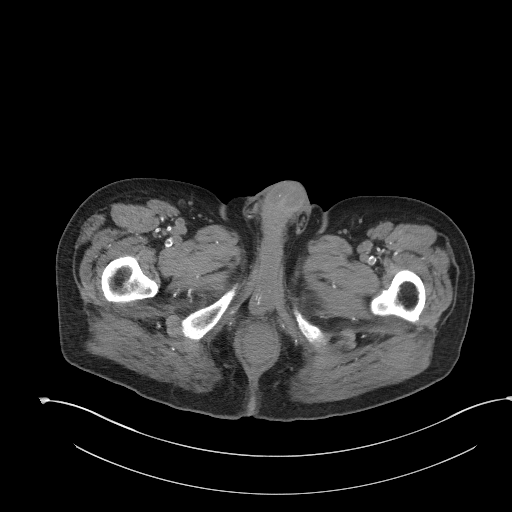
[im 9/113  bone]
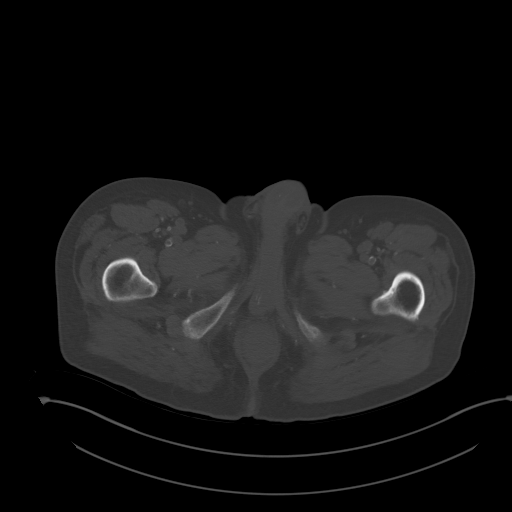
[im 18/113  soft-tissue]
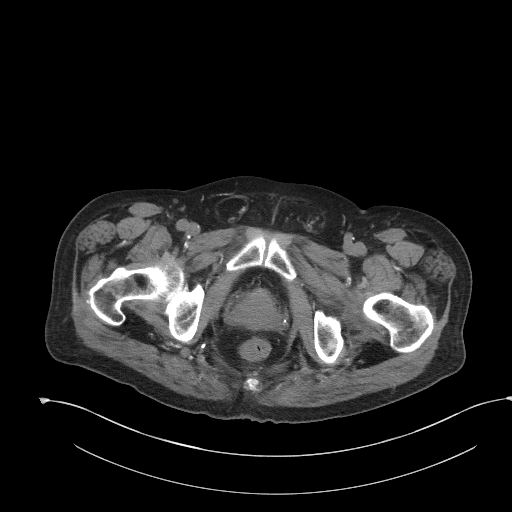
[im 27/113  soft-tissue]
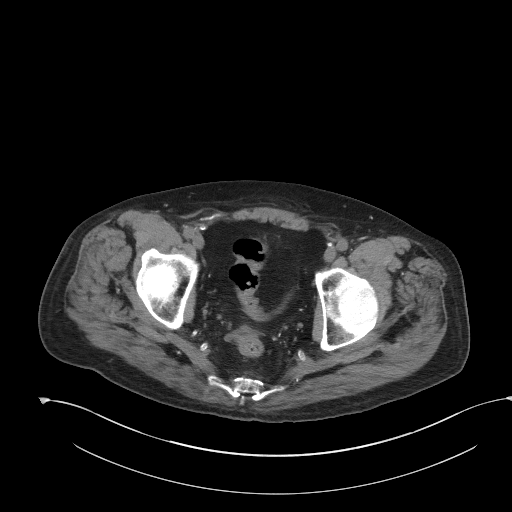
[im 36/113  soft-tissue]
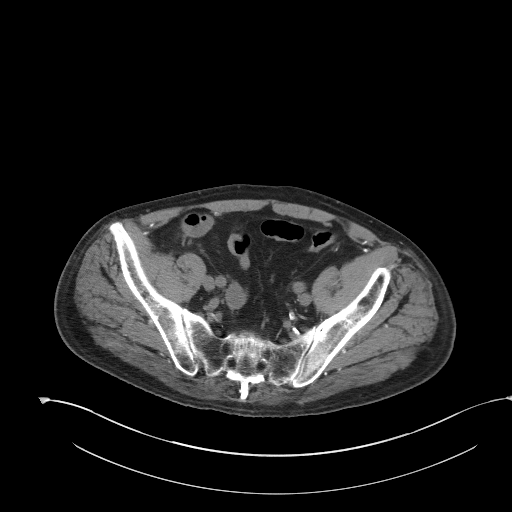
[im 45/113  soft-tissue]
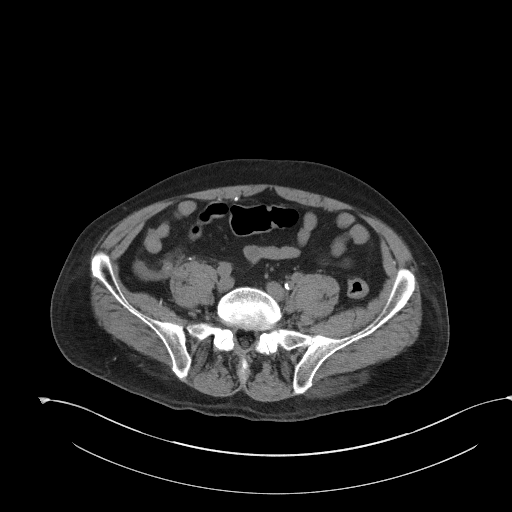
[im 54/113  soft-tissue]
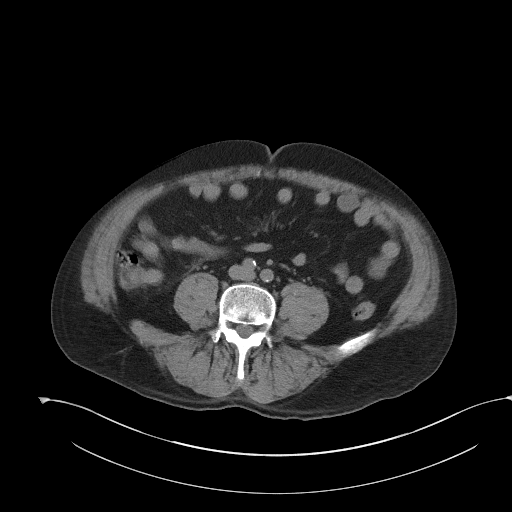
[im 63/113  soft-tissue]
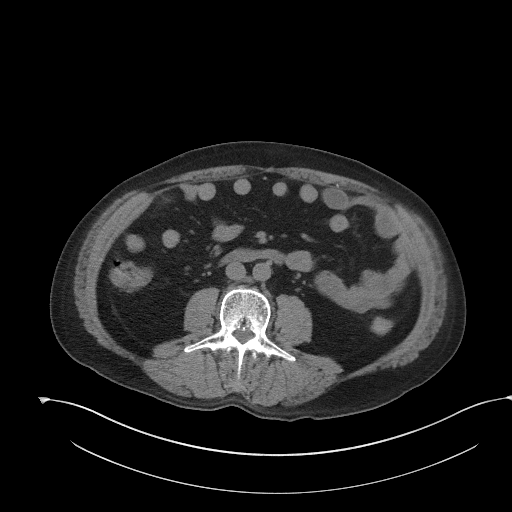
[im 72/113  soft-tissue]
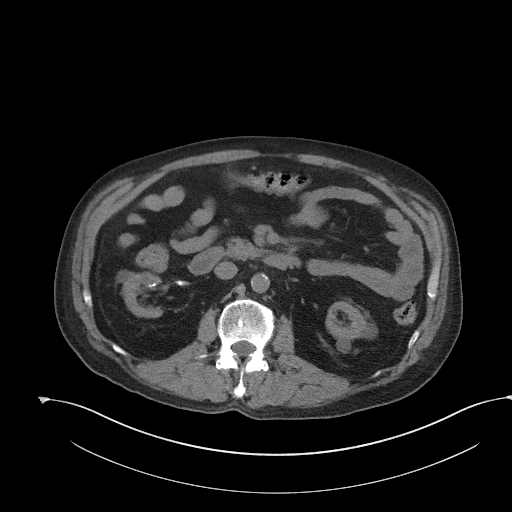
[im 81/113  soft-tissue]
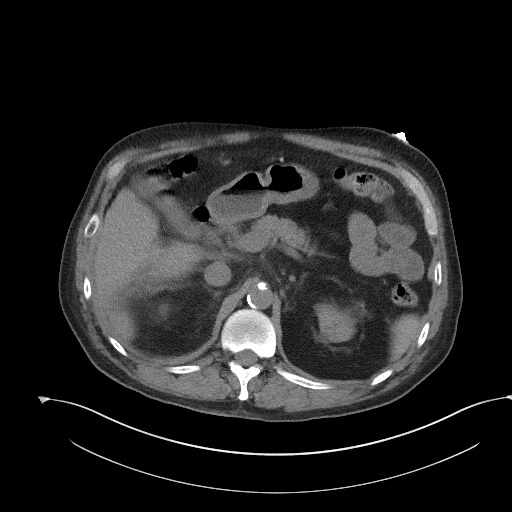
[im 81/113  bone]
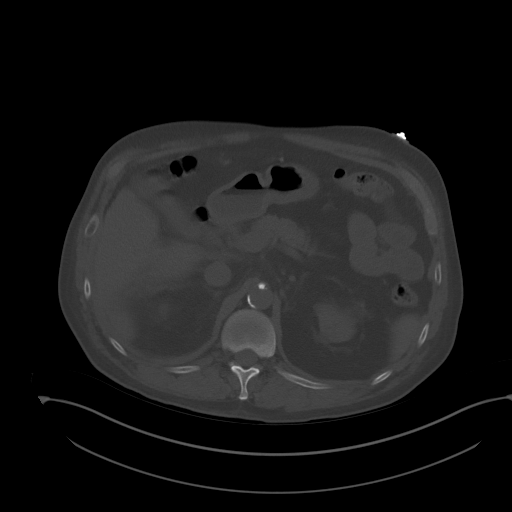
[im 90/113  soft-tissue]
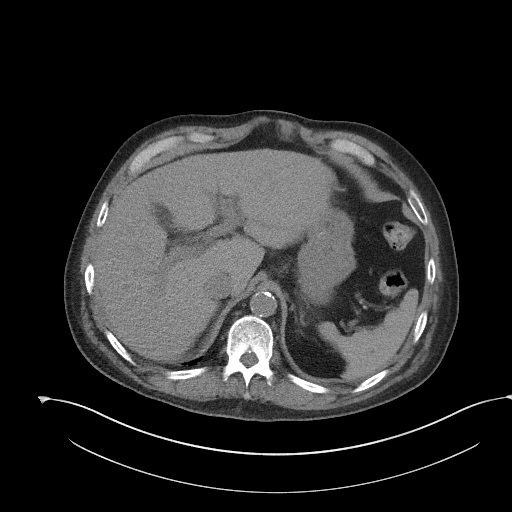
[im 99/113  soft-tissue]
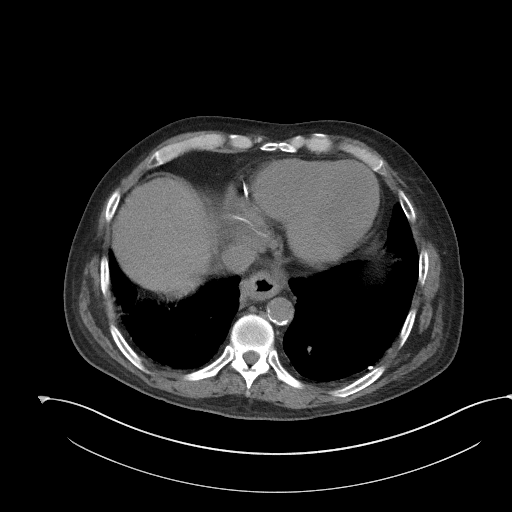
[im 108/113  soft-tissue]
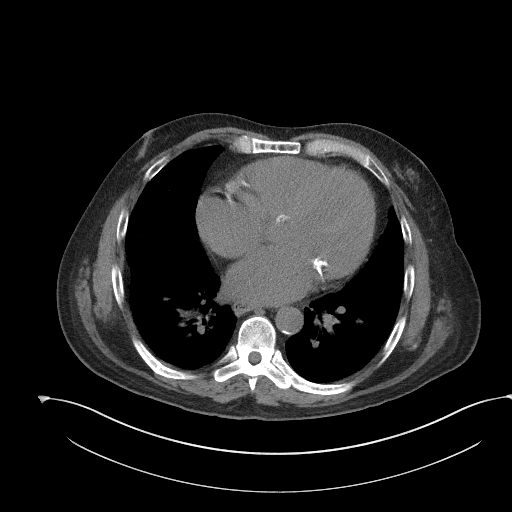

[Series 5: coronal st · coronal · 0.80mm/px · 3 of 89 slices shown]
[im 30/89  soft-tissue]
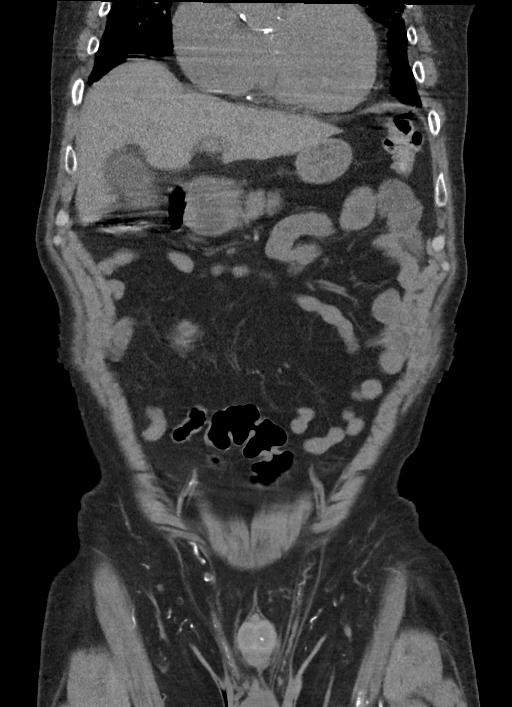
[im 40/89  soft-tissue]
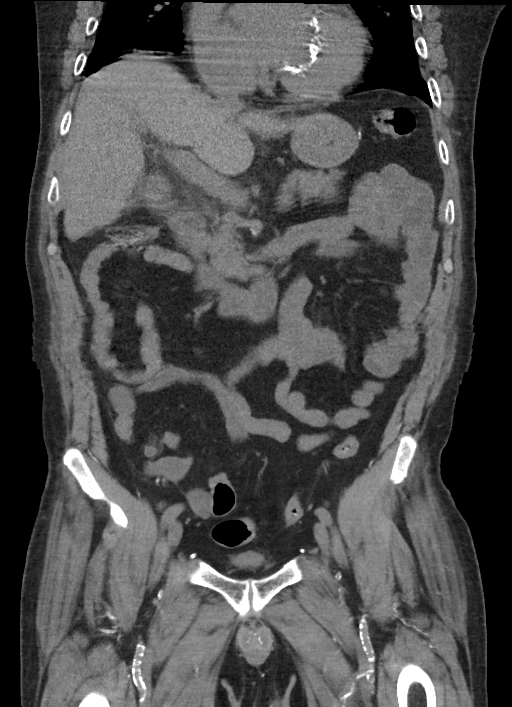
[im 49/89  soft-tissue]
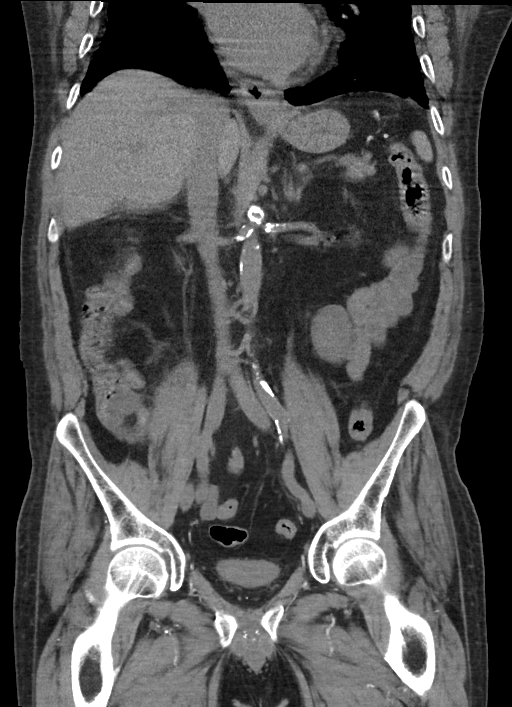

[15 of 46 positions shown; findings below may reference images not displayed]

FINDINGS: Lower chest: Mild interlobular septal thickening and ground-glass
opacities in the lung bases, likely edema. Enlarged heart with
mitral annular, coronary artery and aortic valve calcifications.

Hepatobiliary: Mild periportal edema, likely from volume overload.
No gallstones, gallbladder wall thickening, or biliary dilatation.

Pancreas: Unremarkable. No pancreatic ductal dilatation or
surrounding inflammatory changes.

Spleen: Normal in size without focal abnormality.

Adrenals/Urinary Tract: Adrenal glands are unremarkable. Atrophic
kidneys with multiple bilateral renal cysts, consistent with
acquired cystic renal disease of dialysis, this is new since 9845.
Bladder is nondistended.

Stomach/Bowel: Small hiatal hernia. The stomach is unremarkable.
There is no evidence of bowel obstruction. There are two
appendicoliths, one near the base of the appendix measuring 3 mm
(axial image 68) and another distally measuring 3 mm (axial image
72). The appendix is distended measuring 7 mm, with some
periappendiceal inflammatory change. There is no adjacent abscess.
There is a punctate foci of gas within the appendix distally.

Vascular/Lymphatic: Aortoiliac atherosclerotic calcifications. There
is no lymphadenopathy.

Reproductive: Mildly enlarged prostate.

Other: Small fat containing right inguinal hernia.

Musculoskeletal: No acute or significant osseous findings.
IMPRESSION: Distended appendix measuring 7 mm with small appendicoliths and
adjacent periappendiceal inflammatory change. No adjacent abscess.
Findings could reflect early appendicitis, although a punctate foci
of gas is seen within the distal appendix.

Atrophic kidneys with multiple bilateral cysts consistent with a
acquired cystic renal disease of dialysis.

Cardiomegaly with pulmonary edema noted in the lung bases.

Appendix: Location: Anterior pelvis

Diameter: 7 mm

Appendicolith: Present, two measuring 3mm each

Mucosal hyper-enhancement: Minimal

Extraluminal gas: None

Periappendiceal collection: None
